# Patient Record
Sex: Female | Born: 1935
Health system: Southern US, Community
[De-identification: ages and names within clinical notes are randomized; demographics above are authoritative.]

## PROBLEM LIST (undated history)

## (undated) DIAGNOSIS — F329 Major depressive disorder, single episode, unspecified: Secondary | ICD-10-CM

## (undated) DIAGNOSIS — T4145XA Adverse effect of unspecified anesthetic, initial encounter: Secondary | ICD-10-CM

## (undated) DIAGNOSIS — E039 Hypothyroidism, unspecified: Secondary | ICD-10-CM

## (undated) DIAGNOSIS — I501 Left ventricular failure: Secondary | ICD-10-CM

## (undated) DIAGNOSIS — D649 Anemia, unspecified: Secondary | ICD-10-CM

## (undated) DIAGNOSIS — I447 Left bundle-branch block, unspecified: Secondary | ICD-10-CM

## (undated) DIAGNOSIS — I10 Essential (primary) hypertension: Secondary | ICD-10-CM

## (undated) DIAGNOSIS — F32A Depression, unspecified: Secondary | ICD-10-CM

## (undated) DIAGNOSIS — T8859XA Other complications of anesthesia, initial encounter: Secondary | ICD-10-CM

## (undated) DIAGNOSIS — T2610XA Burn of cornea and conjunctival sac, unspecified eye, initial encounter: Secondary | ICD-10-CM

## (undated) DIAGNOSIS — R519 Headache, unspecified: Secondary | ICD-10-CM

## (undated) DIAGNOSIS — R0602 Shortness of breath: Secondary | ICD-10-CM

## (undated) DIAGNOSIS — K219 Gastro-esophageal reflux disease without esophagitis: Secondary | ICD-10-CM

## (undated) DIAGNOSIS — F419 Anxiety disorder, unspecified: Secondary | ICD-10-CM

## (undated) DIAGNOSIS — R51 Headache: Secondary | ICD-10-CM

## (undated) DIAGNOSIS — I509 Heart failure, unspecified: Secondary | ICD-10-CM

## (undated) DIAGNOSIS — I639 Cerebral infarction, unspecified: Secondary | ICD-10-CM

## (undated) DIAGNOSIS — I219 Acute myocardial infarction, unspecified: Secondary | ICD-10-CM

## (undated) DIAGNOSIS — I428 Other cardiomyopathies: Secondary | ICD-10-CM

## (undated) DIAGNOSIS — M1990 Unspecified osteoarthritis, unspecified site: Secondary | ICD-10-CM

## (undated) HISTORY — PX: CARDIAC CATHETERIZATION: SHX172

## (undated) HISTORY — PX: EYE SURGERY: SHX253

## (undated) HISTORY — DX: Other cardiomyopathies: I42.8

## (undated) HISTORY — PX: TONSILLECTOMY: SUR1361

## (undated) HISTORY — PX: DILATION AND CURETTAGE OF UTERUS: SHX78

## (undated) HISTORY — PX: OTHER SURGICAL HISTORY: SHX169

## (undated) HISTORY — PX: CATARACT EXTRACTION, BILATERAL: SHX1313

## (undated) HISTORY — PX: HERNIA REPAIR: SHX51

---

## 1999-05-26 ENCOUNTER — Encounter: Admission: RE | Admit: 1999-05-26 | Discharge: 1999-05-26 | Payer: Self-pay | Admitting: Family Medicine

## 1999-05-26 ENCOUNTER — Encounter: Payer: Self-pay | Admitting: Family Medicine

## 2000-02-23 ENCOUNTER — Encounter: Payer: Self-pay | Admitting: Emergency Medicine

## 2000-02-23 ENCOUNTER — Emergency Department (HOSPITAL_COMMUNITY): Admission: EM | Admit: 2000-02-23 | Discharge: 2000-02-23 | Payer: Self-pay | Admitting: Emergency Medicine

## 2000-04-29 ENCOUNTER — Other Ambulatory Visit: Admission: RE | Admit: 2000-04-29 | Discharge: 2000-04-29 | Payer: Self-pay | Admitting: Internal Medicine

## 2000-05-31 ENCOUNTER — Ambulatory Visit (HOSPITAL_COMMUNITY): Admission: RE | Admit: 2000-05-31 | Discharge: 2000-05-31 | Payer: Self-pay | Admitting: Interventional Cardiology

## 2000-08-25 ENCOUNTER — Encounter: Admission: RE | Admit: 2000-08-25 | Discharge: 2000-08-25 | Payer: Self-pay | Admitting: Internal Medicine

## 2000-08-25 ENCOUNTER — Encounter: Payer: Self-pay | Admitting: Internal Medicine

## 2000-09-14 ENCOUNTER — Emergency Department (HOSPITAL_COMMUNITY): Admission: EM | Admit: 2000-09-14 | Discharge: 2000-09-14 | Payer: Self-pay

## 2001-03-25 ENCOUNTER — Encounter: Payer: Self-pay | Admitting: Emergency Medicine

## 2001-03-25 ENCOUNTER — Emergency Department (HOSPITAL_COMMUNITY): Admission: EM | Admit: 2001-03-25 | Discharge: 2001-03-25 | Payer: Self-pay | Admitting: Emergency Medicine

## 2001-12-14 ENCOUNTER — Encounter: Admission: RE | Admit: 2001-12-14 | Discharge: 2001-12-14 | Payer: Self-pay | Admitting: Internal Medicine

## 2001-12-14 ENCOUNTER — Encounter: Payer: Self-pay | Admitting: Internal Medicine

## 2002-05-02 ENCOUNTER — Other Ambulatory Visit: Admission: RE | Admit: 2002-05-02 | Discharge: 2002-05-02 | Payer: Self-pay | Admitting: Internal Medicine

## 2002-05-31 ENCOUNTER — Ambulatory Visit (HOSPITAL_COMMUNITY): Admission: RE | Admit: 2002-05-31 | Discharge: 2002-05-31 | Payer: Self-pay | Admitting: Gastroenterology

## 2004-08-25 ENCOUNTER — Encounter: Admission: RE | Admit: 2004-08-25 | Discharge: 2004-08-25 | Payer: Self-pay | Admitting: Internal Medicine

## 2005-09-20 ENCOUNTER — Ambulatory Visit: Payer: Self-pay | Admitting: Hematology & Oncology

## 2005-11-05 ENCOUNTER — Ambulatory Visit: Payer: Self-pay | Admitting: Hematology & Oncology

## 2005-12-16 ENCOUNTER — Encounter: Admission: RE | Admit: 2005-12-16 | Discharge: 2005-12-16 | Payer: Self-pay | Admitting: Internal Medicine

## 2006-02-01 ENCOUNTER — Ambulatory Visit: Payer: Self-pay | Admitting: Hematology & Oncology

## 2006-02-07 LAB — CBC WITH DIFFERENTIAL/PLATELET
Basophils Absolute: 0 10*3/uL (ref 0.0–0.1)
EOS%: 5.4 % (ref 0.0–7.0)
HCT: 37.4 % (ref 34.8–46.6)
HGB: 12.5 g/dL (ref 11.6–15.9)
MCH: 29.4 pg (ref 26.0–34.0)
MONO#: 0.3 10*3/uL (ref 0.1–0.9)
NEUT%: 61.9 % (ref 39.6–76.8)
Platelets: 237 10*3/uL (ref 145–400)
lymph#: 1.2 10*3/uL (ref 0.9–3.3)

## 2006-10-31 ENCOUNTER — Emergency Department (HOSPITAL_COMMUNITY): Admission: EM | Admit: 2006-10-31 | Discharge: 2006-10-31 | Payer: Self-pay | Admitting: Emergency Medicine

## 2006-12-01 ENCOUNTER — Emergency Department (HOSPITAL_COMMUNITY): Admission: EM | Admit: 2006-12-01 | Discharge: 2006-12-01 | Payer: Self-pay | Admitting: Emergency Medicine

## 2008-01-08 ENCOUNTER — Encounter: Admission: RE | Admit: 2008-01-08 | Discharge: 2008-01-08 | Payer: Self-pay | Admitting: Gastroenterology

## 2008-01-16 ENCOUNTER — Emergency Department (HOSPITAL_COMMUNITY): Admission: EM | Admit: 2008-01-16 | Discharge: 2008-01-17 | Payer: Self-pay | Admitting: Emergency Medicine

## 2008-01-23 ENCOUNTER — Encounter: Admission: RE | Admit: 2008-01-23 | Discharge: 2008-01-23 | Payer: Self-pay | Admitting: Gastroenterology

## 2008-02-07 ENCOUNTER — Encounter: Admission: RE | Admit: 2008-02-07 | Discharge: 2008-02-07 | Payer: Self-pay | Admitting: Gastroenterology

## 2008-03-04 ENCOUNTER — Ambulatory Visit (HOSPITAL_COMMUNITY): Admission: RE | Admit: 2008-03-04 | Discharge: 2008-03-04 | Payer: Self-pay | Admitting: Gastroenterology

## 2008-06-20 ENCOUNTER — Emergency Department (HOSPITAL_COMMUNITY): Admission: EM | Admit: 2008-06-20 | Discharge: 2008-06-20 | Payer: Self-pay | Admitting: Emergency Medicine

## 2009-03-24 ENCOUNTER — Inpatient Hospital Stay (HOSPITAL_COMMUNITY): Admission: RE | Admit: 2009-03-24 | Discharge: 2009-03-27 | Payer: Self-pay | Admitting: General Surgery

## 2009-03-24 ENCOUNTER — Encounter (INDEPENDENT_AMBULATORY_CARE_PROVIDER_SITE_OTHER): Payer: Self-pay | Admitting: General Surgery

## 2009-09-10 ENCOUNTER — Encounter: Admission: RE | Admit: 2009-09-10 | Discharge: 2009-09-10 | Payer: Self-pay | Admitting: Gastroenterology

## 2010-06-26 ENCOUNTER — Other Ambulatory Visit: Admission: RE | Admit: 2010-06-26 | Discharge: 2010-06-26 | Payer: Self-pay | Admitting: Gastroenterology

## 2010-07-16 ENCOUNTER — Encounter
Admission: RE | Admit: 2010-07-16 | Discharge: 2010-07-16 | Payer: Self-pay | Source: Home / Self Care | Attending: Neurology | Admitting: Neurology

## 2010-08-30 ENCOUNTER — Encounter: Payer: Self-pay | Admitting: Gastroenterology

## 2010-10-12 ENCOUNTER — Other Ambulatory Visit: Payer: Self-pay | Admitting: Gastroenterology

## 2010-11-14 LAB — CBC
HCT: 40.2 % (ref 36.0–46.0)
Hemoglobin: 13.3 g/dL (ref 12.0–15.0)
Platelets: 191 10*3/uL (ref 150–400)
RBC: 4.44 MIL/uL (ref 3.87–5.11)
RDW: 14.2 % (ref 11.5–15.5)
WBC: 8.1 10*3/uL (ref 4.0–10.5)

## 2010-11-14 LAB — COMPREHENSIVE METABOLIC PANEL
ALT: 11 U/L (ref 0–35)
Alkaline Phosphatase: 49 U/L (ref 39–117)
BUN: 14 mg/dL (ref 6–23)
CO2: 26 mEq/L (ref 19–32)
Chloride: 107 mEq/L (ref 96–112)
GFR calc non Af Amer: 57 mL/min — ABNORMAL LOW (ref >60)
Glucose, Bld: 81 mg/dL (ref 70–99)
Potassium: 4.7 mEq/L (ref 3.5–5.1)
Sodium: 143 mEq/L (ref 135–145)
Total Bilirubin: 0.7 mg/dL (ref 0.3–1.2)

## 2010-11-14 LAB — BASIC METABOLIC PANEL
BUN: 15 mg/dL (ref 6–23)
Calcium: 8.5 mg/dL (ref 8.4–10.5)
GFR calc non Af Amer: >60 mL/min (ref >60)
Glucose, Bld: 133 mg/dL — ABNORMAL HIGH (ref 70–99)
Sodium: 136 mEq/L (ref 135–145)

## 2010-11-14 LAB — DIFFERENTIAL
Basophils Absolute: 0 10*3/uL (ref 0.0–0.1)
Basophils Relative: 1 % (ref 0–1)
Eosinophils Absolute: 0.2 10*3/uL (ref 0.0–0.7)
Monocytes Relative: 6 % (ref 3–12)
Neutrophils Relative %: 69 % (ref 43–77)

## 2010-11-14 LAB — PROTIME-INR: INR: 0.9 (ref 0.00–1.49)

## 2010-11-14 LAB — ABO/RH: ABO/RH(D): O POS

## 2010-12-22 NOTE — Op Note (Signed)
NAME:  Leslie [name], Leslie [name] NO.:  [id number]   MEDICAL RECORD NO.:  [id number]          PATIENT TYPE:  AMB   LOCATION:  ENDO                         FACILITY:  [facility]   PHYSICIAN:  Danise Edge, M.D.   DATE OF BIRTH:  05/07/1931   DATE OF PROCEDURE:  03/04/2008  DATE OF DISCHARGE:  03/04/2008                               OPERATIVE REPORT   PROCEDURE INDICATIONS:  Ms. Leslie [name] is a 77 year old female born  on 09-04-1930.   Leslie [name] has a giant symptomatic hiatal hernia demonstrated by  esophagogastroduodenoscopy in 2008 and upper GI x-ray series on February 07, 2008.  She is scheduled to undergo hiatal hernia repair.  She is  undergoing a preoperative esophageal manometry.   CHRONIC MEDICATIONS:  Toprol, Evista, Accupril, Lexapro, Synthroid,  Aciphex, Plavix, vitamin B12 injections, oral calcium, vitamin D,  furosemide, glucosamine, and potassium.   Lower esophageal sphincter function.  The resting lower esophageal  sphincter pressure is normal at 41.2 mmHg.  The lower esophageal  sphincter length is 3.5 cm.  With swallows, the lower esophageal  sphincter relaxes 82% for 12.2 seconds; the residual pressure is 7.2  mmHg.  Lower esophageal sphincter function is normal.   Esophageal body function based on 10 wet swallows.  A 10 wet swallows  resulted in 90% peristaltic contractions and 10% retrograde  contractions.  Peristaltic contraction amplitude was normal at 83 mmHg.   Upper esophageal sphincter function based on six wet swallows.  There is  94% relaxation of the upper esophageal sphincter with swallows.   ASSESSMENT:  Normal esophageal manometry.   RECOMMENDATIONS:  Proceed with hiatal hernia repair surgery.           ______________________________  Danise Edge, M.D.     MJ/MEDQ  D:  03/12/2008  T:  03/13/2008  Job:  [id number]   cc:   Leslie [name], M.D.

## 2010-12-22 NOTE — Op Note (Signed)
NAME:  Leslie [name], Leslie [name]                ACCOUNT NO.:  [id number]   MEDICAL RECORD NO.:  [id number]          PATIENT TYPE:  AMB   LOCATION:  DAY                          FACILITY:  [facility]   PHYSICIAN:  Adolph Pollack, M.D.DATE OF BIRTH:  1930-12-12   DATE OF PROCEDURE:  03/24/2009  DATE OF DISCHARGE:                               OPERATIVE REPORT   PREOPERATIVE DIAGNOSIS:  Large hiatal hernia (symptomatic).   POSTOPERATIVE DIAGNOSIS:  Large hiatal hernia (symptomatic).   PROCEDURE:  1. Laparoscopic hiatal hernia repair.  2. Laparoscopic gastropexy.   ASSISTANT:  Anselm Pancoast. Zachery Dakins, M.D.   ANESTHESIA:  General.   INDICATIONS:  This 77 year old female has developed some chest and  epigastric type pains, and on further workup is found to have a  giant/large hiatal hernia, with over 50% of her stomach in her chest.  She has no reflux.  She has a normal manometry.  She has received  cardiac clearance and has elected to have the hernia repaired.  The  procedure, risks and aftercare were discussed with her preoperatively.   TECHNIQUE:  She is brought to the operating room, placed supine on the  operating table and general anesthetic was administered.  Foley catheter  was inserted and the abdominal wall was sterilely prepped and draped.  A  supraumbilical incision was made in the midline through the skin,  subcutaneous tissue, fascial layers and peritoneum -- entering the  peritoneal cavity under direct vision.  A pursestring suture of 0 Vicryl  was placed around the fascial edges.  A Hassan trocar was introduced  into the peritoneal cavity and pneumoperitoneum created by insufflation  of CO2 gas.   Next, the angled laparoscope was placed and she was put in reverse  Trendelenburg position.  Two 11 mm trocars were placed in the right  upper quadrant, and one in the left upper quadrant.  A 5 mm trocar was  placed in the subxiphoid region and removed, and a self-retaining liver  retractor was then inserted into the peritoneal cavity through the 5 mm  trocar site.  This retracted the left lobe of the level of liver  anteriorly, exposing the enlarged hiatus.   The stomach was grasped and then pulled back, and into the abdominal  cavity through the hiatal hernia.  Beginning at the left crus, I began  dissecting the sac free from the crus.  I then began at the right crus  and using the harmonic scalpel began incising the sac and dissecting it  free; and I carried this anteriorly, retracting the sac back into the  abdominal cavity, and with it the stomach came as well.  I noticed a  large left hepatic artery was present, and this was preserved.  I  continued to mobilize the hernia sac free from the crura on both sides.  I continued also to mobilize it from the anterior crura as well, and  that is when I reduced the sac back into the abdominal cavity.  The  stomach was completely reduced.  I then was able to create a  retroesophageal window.  The vagus  nerves were both identified and  preserved.  A Penrose drain was placed through the retroesophageal  window, and the GE junction retracted anteriorly.  Posterior sac  attachments to the crura were then divided with the harmonic scalpel.   I then debrided the sac from the stomach using the harmonic scalpel, and  removed a significant amount of the sac and sent it to pathology for  permanent evaluation.   I then inspected the stomach, esophagus and liver; no bleeding or injury  was noted.   Following this, I proceeded to repair the hiatus.  Using 0 nonabsorbable  pledgeted sutures, I repaired the hiatal hernia defect with minimal if  any tension, using 5 of these stitches.  Before beginning the repair, I  inserted a size 40 bougie so I could easily identify the esophagus and  engage the repair.  The dilator was then removed.  There appeared to be  about 2-3 cm of intra-abdominal esophagus present.  I had to mobilize   some of the esophagus from its lateral attachments up in the  mediastinum,  prior to the repair.   I elected not to perform a Nissen fundoplication, given the fact that  she had no reflux preoperatively.  However, I did elect to perform a  gastropexy.  I grabbed the body of the stomach, and brought it up to the  left upper quadrant trocar site using a retractor.  I then performed a  four suture gastropexy with 0 nonabsorbable sutures, including bites of  the abdominal wall musculature used to hold the stomach there.  This  provided an adequate gastropexy.  That trocar was removed.   I then inspected the area and no bleeding was noted.  No evidence of  esophageal, gastric, splenic, liver or intestinal injury were noted.   The self-retaining liver retractor was removed.  She was taken down out  of reverse Trendelenburg position.  All of the remaining trocars were  removed.   The supraumbilical fascial defect was closed by tightening up and tying  down the pursestring suture.  The skin incisions were closed with 4-0  Monocryl subcuticular stitches, followed by Steri-Strips and sterile  dressings.   She tolerated the procedure well without any apparent complications, and  was taken to recovery room in satisfactory condition.      Adolph Pollack, M.D.  Electronically Signed     TJR/MEDQ  D:  03/24/2009  T:  03/24/2009  Job:  [id number]   cc:   Danise Edge, M.D.  Fax: [phone]   Lyn Records, M.D.  Fax: [phone]

## 2010-12-22 NOTE — H&P (Signed)
NAME:  Leslie [name], Leslie [name]                ACCOUNT NO.:  [id number]   MEDICAL RECORD NO.:  [id number]          PATIENT TYPE:  AMB   LOCATION:  DAY                          FACILITY:  [facility]   PHYSICIAN:  Adolph Pollack, M.D.DATE OF BIRTH:  [birth year]   DATE OF ADMISSION:  03/24/2009  DATE OF DISCHARGE:                              HISTORY & PHYSICAL   REASON FOR ADMISSION:  Elective repair of large hiatal hernia.   HISTORY OF PRESENT ILLNESS:  Leslie [name] is a 77 year old female who  started having problems with sharp type pains in her epigastrium,  radiating bilaterally, leading her to go to the emergency department.  She had cardiac evaluation and ultrasound with gallstones, but what was  found is that she had a large hiatal hernia.  Manometry is normal.  She  does not have any reflux symptoms.  Because of for symptomatic hiatal  hernia, she has elected to undergo repair.  She was seen by Dr. Verdis Prime and cleared from a Cardiology standpoint to have the operation.   PAST MEDICAL HISTORY:  1. Large hiatal hernia.  2. Ischemic cardiomyopathy.  3. Cerebrovascular accident with TIAs.  4. Central hypertension.  5. Hypothyroidism.  6. Iron deficiency anemia.  7. Depression.  8. Carotid artery disease.  9. Retinal detachment.   PREVIOUS OPERATIONS:  Cataract surgery.   ALLERGIES:  SULFA DRUGS.   MEDICATIONS:  Metoprolol, Plavix, furosemide, quinapril, Lexapro, L-  thyroxine, iron, lorazepam, oxycodone p.r.n., calcium, glucosamine.   SOCIAL HISTORY:  Has an aide helper at home.  No tobacco or alcohol use.   FAMILY HISTORY:  Notable for heart disease, hypertension, dementia and  cancer.  She states her mother had a hiatal hernia.   PHYSICAL EXAMINATION:  GENERAL:  An elderly female in no acute distress.  She is pleasant and cooperative.  VITAL SIGNS: Temperature is 97.2, blood pressure is 157/78, pulse 61,  respiratory rate 18, O2 saturations 95% on room air.  NECK:  Supple  without masses.  RESPIRATORY:  Breath sounds equal and clear.  Respirations unlabored.  CARDIOVASCULAR:  Regular rate, regular rhythm.  No significant lower  extremity edema.  ABDOMEN:  Soft, nontender, nondistended.  No masses or organomegaly.  MUSCULOSKELETAL:  Good range of motion.  SKIN:  No jaundice.   IMPRESSION:  Symptomatic large hiatal hernia.   PLAN:  Laparoscopic possible open repair of large hiatal hernia.  Possible fundoplication as well, although, she does not have reflux  symptoms Upon further consideration, it may  be best for her to have the  hiatal hernia and gastrostomy tube or gastropexy done.      Adolph Pollack, M.D.  Electronically Signed     TJR/MEDQ  D:  03/24/2009  T:  03/24/2009  Job:  [id number]   cc:   Danise Edge, M.D.  Fax: [phone]   Lyn Records, M.D.  Fax: [phone]

## 2010-12-25 NOTE — Cardiovascular Report (Signed)
[place]  Patient:    Leslie [name], Leslie [name]                       MRN: [id number] Adm. Date:  [id number] Attending:  Lyn Records. Iii CC:         Georgann Housekeeper, M.D.   Cardiac Catheterization  ADDENDUM:  Ms. Hadlock underwent left and right heart catheterization.  The arteriotomy puncture was closed by Perclose without complications.  She received 1 gram of Ancef as prophylaxis against puncture site infection. DD:  05/31/00 TD:  05/31/00 Job: [id number]

## 2010-12-25 NOTE — Op Note (Signed)
   NAME:  Leslie [name], Leslie [name]                          ACCOUNT NO.:  [id number]   MEDICAL RECORD NO.:  [id number]                   PATIENT TYPE:  AMB   LOCATION:  ENDO                                 FACILITY:  [facility]   PHYSICIAN:  Danise Edge, M.D.                DATE OF BIRTH:  10/09/1930   DATE OF PROCEDURE:  05/31/2002  DATE OF DISCHARGE:                                 OPERATIVE REPORT   PROCEDURE:  Screening colonoscopy.   INDICATIONS:  The patient is a 77 year old female born [birth year].  The patient  is scheduled to undergo her first screening colonoscopy with polypectomy to  prevent colon cancer.  I discussed with the patient the complications  associated with colonoscopy and polypectomy, including a 15 per thousand  risk of bleeding and one per thousand risk of colon perforation requiring  surgical repair.  The patient has signed the operative permit.   ENDOSCOPIST:  Danise Edge, M.D.   PREMEDICATION:  Versed 10 mg, fentanyl 50 mcg.   ENDOSCOPE:  Olympus pediatric colonoscope.   DESCRIPTION OF PROCEDURE:  After obtaining informed consent, the patient was  placed in the left lateral decubitus position.  I administered intravenous  Versed and intravenous fentanyl to achieve conscious sedation for the  procedure.  The patient's blood pressure, oxygen saturation, and cardiac  rhythm were monitored throughout the procedure and documented in the medical  record.   Anal inspection was normal.  Digital rectal exam was normal.  The Olympus  pediatric video colonoscope was introduced into the rectum and easily  advanced to the cecum.  Colonic preparation for the exam today was  excellent.   Rectum normal.   Sigmoid colon and descending colon normal.   Splenic flexure normal.   Transverse colon normal.   Hepatic flexure normal.   Ascending colon normal.   Cecum and ileocecal valve normal.    ASSESSMENT:  Normal screening proctocolonoscopy to the cecum.  No  endoscopic  evidence for the presence of colorectal neoplasia.                                                Danise Edge, M.D.    MJ/MEDQ  D:  05/31/2002  T:  05/31/2002  Job:  [id number]   cc:   Georgann Housekeeper, M.D.  [address]  [place]  [place] [address]  Fax: [phone]

## 2010-12-25 NOTE — Cardiovascular Report (Signed)
[place]  Patient:    LIVIAH, CAKE                       MRN: [id number] Proc. Date: 05/31/00 Adm. Date:  [id number] Attending:  Lyn Records. Iii CC:         Georgann Housekeeper, M.D.   Cardiac Catheterization  INDICATIONS:  Abnormal CV functional test with LV dysfunction and abnormal perfusion pattern suggesting prior infarction.  PROCEDURE: 1. Left heart catheterization. 2. Right heart catheterization. 3. Coronary angiography. 4. Left ventriculography.  RESULTS:  HEMODYNAMIC DATA:  Aortic pressure 171/70 mmHg.  Left ventricular pressure 173/21 mmHg.  Right atrial mean pressure 7.  RV pressure 38/10 mmHg. Pulmonary artery pressure 37/21 mmHg with a mean of 28.  Pulmonary capillary wedge pressure mean 17 mmHg.  Thermodilution cardiac output 4.5 l/min.  Fick cardiac output 5.0 l/min.  LEFT VENTRICULOGRAPHY:  The left ventricle was dilated.  There appears to be more significant abnormality in wall motion involving the anterior and apical region than the anterobasal and inferobasal region.  The EF is estimated to be 35 to 45%.  No mitral regurgitation is noted.  CORONARY ANGIOGRAPHY:  Left main coronary artery.  Normal.  Left anterior descending coronary artery.  Normal.  No evidence of significant obstruction.  A large diagonal branch arises from the proximal LAD.  Two smaller diagonal branches arise from the midvessel.  The entire LAD system is normal.  Circumflex artery.  The circumflex is relatively small giving origin to three obtuse marginal branches.  The system is normal.  Right coronary artery.  The right coronary artery is a large vessel that gives origin to a large posterior descending branch and three left ventricular branches and is normal.  CONCLUSION: 1. Normal coronary arteries. 2. Left ventricular dysfunction with ejection fraction in the 35 to 45% range. 3. Normal right heart pressures.  RECOMMENDATION:  Medical  therapy to include ACE inhibitor therapy, beta blocker therapy, and potential diuretic therapy if needed. DD:  05/31/00 TD:  05/31/00 Job: [id number]

## 2011-05-06 LAB — CBC
HCT: 36.4
MCV: 77.4 — ABNORMAL LOW
RBC: 4.71
WBC: 9.5

## 2011-05-06 LAB — DIFFERENTIAL
Lymphocytes Relative: 12
Lymphs Abs: 1.2
Monocytes Relative: 7
Neutro Abs: 7.2
Neutrophils Relative %: 75

## 2011-05-06 LAB — COMPREHENSIVE METABOLIC PANEL
BUN: 15
Calcium: 9.1
Creatinine, Ser: 1.27 — ABNORMAL HIGH
Glucose, Bld: 135 — ABNORMAL HIGH
Sodium: 137
Total Protein: 6.7

## 2011-05-06 LAB — POCT CARDIAC MARKERS
CKMB, poc: 1.8
CKMB, poc: <1 — ABNORMAL LOW
Myoglobin, poc: 81.8
Troponin i, poc: <0.05
Troponin i, poc: <0.05

## 2011-05-06 LAB — URINALYSIS, ROUTINE W REFLEX MICROSCOPIC
Ketones, ur: NEGATIVE
Nitrite: NEGATIVE
Specific Gravity, Urine: 1.025
Urobilinogen, UA: 0.2
pH: 5

## 2011-05-06 LAB — LIPASE, BLOOD: Lipase: 35

## 2011-05-06 LAB — URINE MICROSCOPIC-ADD ON

## 2011-05-11 LAB — D-DIMER, QUANTITATIVE: D-Dimer, Quant: 7.12 — ABNORMAL HIGH

## 2011-05-11 LAB — LIPASE, BLOOD: Lipase: 29

## 2011-05-11 LAB — COMPREHENSIVE METABOLIC PANEL
ALT: 16
BUN: 24 — ABNORMAL HIGH
CO2: 31
Calcium: 10.2
Creatinine, Ser: 1.23 — ABNORMAL HIGH
GFR calc non Af Amer: 43 — ABNORMAL LOW
Glucose, Bld: 129 — ABNORMAL HIGH
Sodium: 143
Total Protein: 7.2

## 2011-05-11 LAB — CBC
Hemoglobin: 15.4 — ABNORMAL HIGH
MCHC: 32.9
MCV: 88.1
RDW: 14.1

## 2011-05-11 LAB — POCT CARDIAC MARKERS
CKMB, poc: 1
Myoglobin, poc: 91.1
Troponin i, poc: <0.05

## 2011-05-11 LAB — DIFFERENTIAL
Lymphocytes Relative: 13
Lymphs Abs: 1.4
Monocytes Relative: 5
Neutro Abs: 8.9 — ABNORMAL HIGH
Neutrophils Relative %: 81 — ABNORMAL HIGH

## 2011-09-30 ENCOUNTER — Encounter (HOSPITAL_COMMUNITY): Payer: Self-pay | Admitting: Emergency Medicine

## 2011-09-30 ENCOUNTER — Emergency Department (HOSPITAL_COMMUNITY): Payer: Medicare Other

## 2011-09-30 ENCOUNTER — Other Ambulatory Visit: Payer: Self-pay

## 2011-09-30 ENCOUNTER — Emergency Department (HOSPITAL_COMMUNITY)
Admission: EM | Admit: 2011-09-30 | Discharge: 2011-09-30 | Disposition: A | Discharge status: Home / Self Care | Payer: Medicare Other | Attending: Emergency Medicine | Admitting: Emergency Medicine

## 2011-09-30 DIAGNOSIS — I6789 Other cerebrovascular disease: Secondary | ICD-10-CM | POA: Insufficient documentation

## 2011-09-30 DIAGNOSIS — I252 Old myocardial infarction: Secondary | ICD-10-CM | POA: Insufficient documentation

## 2011-09-30 DIAGNOSIS — M542 Cervicalgia: Secondary | ICD-10-CM

## 2011-09-30 DIAGNOSIS — R51 Headache: Secondary | ICD-10-CM

## 2011-09-30 DIAGNOSIS — I69998 Other sequelae following unspecified cerebrovascular disease: Secondary | ICD-10-CM | POA: Insufficient documentation

## 2011-09-30 DIAGNOSIS — H538 Other visual disturbances: Secondary | ICD-10-CM | POA: Insufficient documentation

## 2011-09-30 DIAGNOSIS — I10 Essential (primary) hypertension: Secondary | ICD-10-CM | POA: Insufficient documentation

## 2011-09-30 DIAGNOSIS — M47812 Spondylosis without myelopathy or radiculopathy, cervical region: Secondary | ICD-10-CM

## 2011-09-30 HISTORY — DX: Cerebral infarction, unspecified: I63.9

## 2011-09-30 HISTORY — DX: Essential (primary) hypertension: I10

## 2011-09-30 HISTORY — DX: Left ventricular failure, unspecified: I50.1

## 2011-09-30 HISTORY — DX: Left bundle-branch block, unspecified: I44.7

## 2011-09-30 HISTORY — DX: Acute myocardial infarction, unspecified: I21.9

## 2011-09-30 LAB — COMPREHENSIVE METABOLIC PANEL
ALT: 10 U/L (ref 0–35)
AST: 17 U/L (ref 0–37)
Albumin: 3.7 g/dL (ref 3.5–5.2)
CO2: 33 mEq/L — ABNORMAL HIGH (ref 19–32)
Calcium: 9.7 mg/dL (ref 8.4–10.5)
Chloride: 104 mEq/L (ref 96–112)
Creatinine, Ser: 0.86 mg/dL (ref 0.50–1.10)
Sodium: 142 mEq/L (ref 135–145)

## 2011-09-30 LAB — DIFFERENTIAL
Basophils Absolute: 0 10*3/uL (ref 0.0–0.1)
Basophils Relative: 1 % (ref 0–1)
Lymphocytes Relative: 24 % (ref 12–46)
Monocytes Absolute: 0.4 10*3/uL (ref 0.1–1.0)
Neutro Abs: 4.1 10*3/uL (ref 1.7–7.7)

## 2011-09-30 LAB — PROTIME-INR: INR: 0.92 (ref 0.00–1.49)

## 2011-09-30 LAB — CBC
HCT: 43.2 % (ref 36.0–46.0)
MCHC: 33.3 g/dL (ref 30.0–36.0)
Platelets: 210 10*3/uL (ref 150–400)
RDW: 13 % (ref 11.5–15.5)
WBC: 6.2 10*3/uL (ref 4.0–10.5)

## 2011-09-30 LAB — APTT: aPTT: 31 seconds (ref 24–37)

## 2011-09-30 MED ORDER — ONDANSETRON HCL 4 MG/2ML IJ SOLN
4.0000 mg | Freq: Once | INTRAMUSCULAR | Status: AC
  Administered 2011-09-30: 4 mg via INTRAVENOUS

## 2011-09-30 MED ORDER — ONDANSETRON HCL 4 MG/2ML IJ SOLN
INTRAMUSCULAR | Status: AC
  Filled 2011-09-30: qty 2

## 2011-09-30 MED ORDER — DOXYCYCLINE HYCLATE 100 MG PO TABS
ORAL_TABLET | ORAL | Status: AC
  Filled 2011-09-30: qty 1

## 2011-09-30 MED ORDER — HYDROMORPHONE HCL PF 1 MG/ML IJ SOLN
0.5000 mg | Freq: Once | INTRAMUSCULAR | Status: AC
  Administered 2011-09-30: 0.5 mg via INTRAVENOUS
  Filled 2011-09-30: qty 1

## 2011-09-30 MED ORDER — TRAMADOL HCL 50 MG PO TABS: 50.0000 mg | ORAL_TABLET | Freq: Four times a day (QID) | ORAL | Status: AC | PRN

## 2011-09-30 MED ORDER — SODIUM CHLORIDE 0.9 % IV BOLUS (SEPSIS)
500.0000 mL | Freq: Once | INTRAVENOUS | Status: AC
  Administered 2011-09-30: 500 mL via INTRAVENOUS

## 2011-09-30 NOTE — ED Provider Notes (Signed)
Patient with headache began on Sunday and continued through Tuesday. She states that it resolved yesterday and then returned again today. Patient describes the headache as diffuse and behind her eyes. She does have a history of migraines but has not had them for approximately 20 years. She's had some nausea that this but no vomiting. She has had a sixth nerve palsy of her left eye secondary to a stroke and has had blurring of her vision secondary to this has not had any new changes in her vision. She has not had any fever, chills, nasal congestion, or history of bleeding disorders.  Physical exam well-developed well-nourished female who appears to be mildly uncomfortable Vital signs are significant for heart grade of 55 and blood pressure 151/81 HEENT are significant significant for a left sixth nerve palsy but pupils are equal round and reactive to light. There is no facial droop noted. Mucous membranes are moist. Neck trachea is midline no thyromegaly is noted.  Carotid pulses are 2+ bilaterally Chest is symmetrical lungs are clear to auscultation heart is regular rate and rhythm Abdomen is soft and nontender Extremities no she does show signs of trauma or edema Neurologically she is alert and oriented x3. She has no palmar drift. Her strength is equal throughout. Deep tendon reflexes are equal throughout.    Hilario Quarry, MD 09/30/11 [phone]

## 2011-09-30 NOTE — ED Provider Notes (Signed)
History     CSN: [id number]  Arrival date & time 09/30/11  1619   First MD Initiated Contact with Patient 09/30/11 1634      Chief Complaint  Patient presents with  . Headache  . Hypertension    (Consider location/radiation/quality/duration/timing/severity/associated sxs/prior treatment) HPI Leslie [name] is a 78 y.o. female who has had intermittent headache for 5 days. She had no pain. Yesterday. Today, the pain came back. She has been sewing a lot lately and feels like pulling on heavy material has caused some shoulder pain. She has chronic left cranial nerve 6 palsy and feels like it is getting somewhat worse gradually over the last several months. She has no other weakness. She has been seeing ophthalmologist for bilateral blurred vision and has received some laser treatment. She is taking her Plavix as prescribed for CVA. She is due to get carotid study to followup on narrowing. She has no chest pain, nausea, vomiting, cough, shortness of breath, or intestinal disorders. She does not describe a thunderclap headache. She has not had persistent headache like this in the past. She has generalized arthritis, but never has had her neck imaged.     Past Medical History  Diagnosis Date  . Bundle branch block left   . Hypertension   . MI (myocardial infarction)   . Heart failure, left-sided   . Stroke     blurred vision residual    No past surgical history on file.  No family history on file.  History  Substance Use Topics  . Smoking status: Not on file  . Smokeless tobacco: Not on file  . Alcohol Use:     OB History    Grav Para Term Preterm Abortions TAB SAB Ect Mult Living                  Review of Systems  All other systems reviewed and are negative.    Allergies  Sulfa antibiotics  Home Medications   Current Outpatient Rx  Name Route Sig Dispense Refill  . CALCIUM 1200 PO Oral Take 1 tablet by mouth daily.    Marland Kitchen CLOPIDOGREL BISULFATE 75 MG PO TABS Oral  Take 75 mg by mouth daily.    Marland Kitchen ESCITALOPRAM OXALATE 10 MG PO TABS Oral Take 10 mg by mouth daily.    . FUROSEMIDE 20 MG PO TABS Oral Take 20 mg by mouth every morning.    Marland Kitchen LEVOTHYROXINE SODIUM 100 MCG PO TABS Oral Take 100 mcg by mouth every morning.    Marland Kitchen LORAZEPAM 1 MG PO TABS Oral Take 1 mg by mouth at bedtime as needed. For anxiety/restlessness    . METOPROLOL SUCCINATE ER 50 MG PO TB24 Oral Take 50 mg by mouth every morning. Take with or immediately following a meal.    . QUINAPRIL HCL 40 MG PO TABS Oral Take 40 mg by mouth at bedtime.    Marland Kitchen RABEPRAZOLE SODIUM 20 MG PO TBEC Oral Take 20 mg by mouth daily as needed. For indigestion    . TRAMADOL HCL 50 MG PO TABS Oral Take 1 tablet (50 mg total) by mouth every 6 (six) hours as needed for pain. 30 tablet 0    BP 139/61  Pulse 52  Temp(Src) 98.2 F (36.8 C) (Oral)  Resp 15  SpO2 96%  Physical Exam  Nursing note and vitals reviewed. Constitutional: She is oriented to person, place, and time. She appears well-developed and well-nourished.  HENT:  Head: Normocephalic and atraumatic.  Eyes: Conjunctivae and EOM are normal. Pupils are equal, round, and reactive to light.  Neck: Normal range of motion and phonation normal. Neck supple.       There is no meningismus. She has somewhat decreased range of motion to lateral looking, which seems to be limited by pain.  Cardiovascular: Normal rate, regular rhythm and intact distal pulses.   Pulmonary/Chest: Effort normal and breath sounds normal. She exhibits no tenderness.  Abdominal: Soft. She exhibits no distension. There is no tenderness. There is no guarding.  Musculoskeletal: Normal range of motion.  Neurological: She is alert and oriented to person, place, and time. She has normal strength. She exhibits normal muscle tone. Coordination normal.       She has a very mild, left cranial nerve 6 palsy, but otherwise no apparent cranial nerve abnormality. She has normal strength and sensation in  the arms and legs bilaterally.  Skin: Skin is warm and dry.  Psychiatric: Her behavior is normal. Judgment and thought content normal.       She appears depressed    ED Course  Procedures (including critical care time)  Labs Reviewed  COMPREHENSIVE METABOLIC PANEL - Abnormal; Notable for the following:    CO2 33 (*)    Glucose, Bld 101 (*)    GFR calc non Af Amer 63 (*)    GFR calc Af Amer 73 (*)    All other components within normal limits  CBC  DIFFERENTIAL  PROTIME-INR  APTT   Dg Cervical Spine Complete  09/30/2011  *RADIOLOGY REPORT*  Clinical Data: Neck pain and headaches.  CERVICAL SPINE - COMPLETE 4+ VIEW  Comparison: None.  Findings: There is no evidence of fracture or subluxation. Vertebral bodies demonstrate normal height and alignment.  There is suggestion of mild disc space narrowing at C6-C7.  Prevertebral soft tissues are within normal limits.  The provided odontoid view demonstrates no significant abnormality.  The visualized lung apices are clear.  IMPRESSION:  1.  No evidence of fracture or subluxation along the cervical spine. 2.  Minimal degenerative change at the lower cervical spine.  Original Report Authenticated By: Tonia Ghent, M.D.   Ct Head Wo Contrast  09/30/2011  *RADIOLOGY REPORT*  Clinical Data: Headache, hypertension.  CT HEAD WITHOUT CONTRAST  Technique:  Contiguous axial images were obtained from the base of the skull through the vertex without contrast.  Comparison: 10/31/2006  Findings: Chronic small vessel disease changes throughout the deep white matter. No acute intracranial abnormality.  Specifically, no hemorrhage, hydrocephalus, mass lesion, acute infarction, or significant intracranial injury.  No acute calvarial abnormality. Visualized paranasal sinuses and mastoids clear.  Orbital soft tissues unremarkable.  IMPRESSION: Chronic small vessel disease. No acute intracranial abnormality.  Original Report Authenticated By: Cyndie Chime, M.D.    Patient received IV analgesia, and had complete resolution of her headache as of 2220 hours. She desired to get an x-ray of her neck to be evaluated for evident degenerative change.  1. Neck pain   2. Headache   3. Degenerative joint disease of cervical spine       MDM  Nonspecific headache. This intermittent headache is unlikely to be intracranial bleeding, stroke, meningitis, or related to a metabolic process. Patient appears depressed. This could contribute to her discomfort. She also has mild arthritis lower cervical spine, and has been doing activities, which can potentiate, neck pain and headache. Patient stable for discharge with symptomatic treatment.        Flint Melter, MD  09/30/11 2324 

## 2011-09-30 NOTE — ED Notes (Signed)
Per EMS; about Sunday, started to have headache, has gotten worse of last few days, no neuro deficits; pt with hx of CVA residual with blurred vision; previous hx of migraines- pt reports that this is worse; silent MI 2002; LBBB, HR 55; allergic to sulfa; last 188/100, 230/110; 20g LAC

## 2011-09-30 NOTE — ED Notes (Signed)
Patient transported to X-ray 

## 2011-09-30 NOTE — ED Notes (Signed)
Spoke with Dr. Lawana Pai. He states that he never received call for consult and after reviewing the information he does not feel that he needs to see this patient at this time. He states to have MD call him back if he needs to consult on the pt. Seems to be some confusion.

## 2011-09-30 NOTE — Discharge Instructions (Signed)
Use heat on the sore area of your neck 3-4 times a day. See Dr. for a checkup in one week. Return here if your symptoms worsen.Headache and Arthritis Headaches and arthritis are common problems. This causes an interest in the possible role of arthritis in causing headaches. Several major forms of arthritis exist. Two of the most common types are:  Rheumatoid arthritis.   Osteoarthritis.  Rheumatoid arthritis may begin at any age. It is a condition in which the body attacks some of its own tissues, thinking they do not belong. This leads to destruction of the bony areas around the joints. This condition may afflict any of the body's joints. It usually produces a deformity of the joint. The hands and fingers no longer appear straight but often appear angled towards one side. In some cases, the spine may be involved. Most often it is the vertebrae of the neck (cervicalspine). The areas of the neck most commonly afflicted by rheumatoid arthritis are the first and second cervical vertebrae. Curiously, rheumatoid arthritis, though it often produces severe deformities, is not always painful.  The more common form of arthritis is osteoarthritis. It is a wear-and-tear form of arthritis. It usually does not produce deformity of the joints or destruction of the bony tissues. Rather the ligaments weaken. They may be calcified due to the body's attempt to heal the damage. The larger joints of the body and those joints that take the most stress and strain are the most often affected. In the neck region this osteoarthritis usually involves the fifth, sixth and seventh vertebrae. This is because the effects of posture produce the most fatigue on them. Osteoarthritis is often more painful than rheumatoid arthritis.  During workups for arthritis, a test evaluating inflammation, (the sedimentation rate) often is performed. In rheumatoid arthritis, this test will usually be elevated. Other tests for inflammation may also be  elevated. In patients with osteoarthritis, x-rays of the neck or jaw joints will show changes from "lipping" of the vertebrae. This is caused by calcium deposits in the ligaments. Or they may show narrowing of the space between the vertebrae, or spur formation (from calcium deposits). If severe, it may cause obstruction of the holes where the nerves pass from the spine to the body. In rheumatoid arthritis, dislocation of vertebrae may occur in the upper neck. CT scan and MRI in patients with osteoarthritis may show bulging of the discs that cushion the vertebrae. In the most severe cases, herniation of the discs may occur.  Headaches, felt as a pain in the neck, may be caused by arthritis if the first, second or third vertebrae are involved. This condition is due to the nerves that supply the scalp only originating from this area of the spine. Neck pain itself, whether alone or coupled with headaches, can involve any portion of the neck. If the jaw is involved, the symptoms are similar to those of Temporomandibular Joint Syndrome (TMJ).  The progressive severity of rheumatoid arthritis may be slowed by a variety of potent medications. In osteoarthritis, its progression is not usually hindered by medication. The following may be helpful in slowing the advancement of the disorder:  Lifestyle adjustment.   Exercise.   Rest.   Weight loss.  Medications, such as the nonsteroidal anti-inflammatory agents (NSAIDs), are useful. They may reduce the pain and improve the reduced motion which occurs in joints afflicted by arthritis. From some studies, the use of acetaminophen appears to be as effective in controlling the pain of arthritis as  the NSAIDs. Physical modalities may also be useful for arthritis. They include:  Heat.   Massage.   Exercise.  But physical therapy must be prescribed by a caregiver, just as most medications for arthritis.  Document Released: 10/16/2003 Document Revised: 04/07/2011  Document Reviewed: 03/14/2008 [facility] Patient Information 2012 [place], [place].

## 2012-05-02 ENCOUNTER — Other Ambulatory Visit: Payer: Self-pay | Admitting: Neurosurgery

## 2012-05-03 ENCOUNTER — Encounter (HOSPITAL_COMMUNITY): Payer: Self-pay | Admitting: Pharmacy Technician

## 2012-05-09 NOTE — Pre-Procedure Instructions (Signed)
20 Leslie [name]  05/09/2012   Your procedure is scheduled on:  05-17-2012  Report to [facility] at 10:30 AM.  Call this number if you have problems the morning of surgery: [phone]   Remember:   Do not eat food or drink:After Midnight.     Take these medicines the morning of surgery with A SIP OF WATER: lexapro,levothyroxine,metoprolol(Toprol  XR) aciphex    Do not wear jewelry, make-up or nail polish.  Do not wear lotions, powders, or perfumes. You may wear deodorant.  Do not shave 48 hours prior to surgery. Men may shave face and neck.  Do not bring valuables to the hospital.  Contacts, dentures or bridgework may not be worn into surgery.  Leave suitcase in the car. After surgery it may be brought to your room.   For patients admitted to the hospital, checkout time is 11:00 AM the day of discharge.       Special Instructions: Shower using CHG 2 nights before surgery and the night before surgery.  If you shower the day of surgery use CHG.  Use special wash - you have one bottle of CHG for all showers.  You should use approximately 1/3 of the bottle for each shower.     Please read over the following fact sheets that you were given: Pain Booklet, MRSA Information and Surgical Site Infection Prevention

## 2012-05-10 ENCOUNTER — Ambulatory Visit (HOSPITAL_COMMUNITY)
Admission: RE | Admit: 2012-05-10 | Discharge: 2012-05-10 | Disposition: A | Discharge status: Home / Self Care | Payer: Medicare Other | Source: Ambulatory Visit | Attending: Neurosurgery | Admitting: Neurosurgery

## 2012-05-10 ENCOUNTER — Encounter (HOSPITAL_COMMUNITY)
Admission: RE | Admit: 2012-05-10 | Discharge: 2012-05-10 | Disposition: A | Discharge status: Home / Self Care | Payer: Medicare Other | Source: Ambulatory Visit | Attending: Neurosurgery | Admitting: Neurosurgery

## 2012-05-10 ENCOUNTER — Encounter (HOSPITAL_COMMUNITY): Payer: Self-pay

## 2012-05-10 DIAGNOSIS — Z01818 Encounter for other preprocedural examination: Secondary | ICD-10-CM | POA: Insufficient documentation

## 2012-05-10 DIAGNOSIS — Z01812 Encounter for preprocedural laboratory examination: Secondary | ICD-10-CM | POA: Insufficient documentation

## 2012-05-10 HISTORY — DX: Unspecified osteoarthritis, unspecified site: M19.90

## 2012-05-10 HISTORY — DX: Heart failure, unspecified: I50.9

## 2012-05-10 HISTORY — DX: Major depressive disorder, single episode, unspecified: F32.9

## 2012-05-10 HISTORY — DX: Gastro-esophageal reflux disease without esophagitis: K21.9

## 2012-05-10 HISTORY — DX: Depression, unspecified: F32.A

## 2012-05-10 HISTORY — DX: Anemia, unspecified: D64.9

## 2012-05-10 HISTORY — DX: Shortness of breath: R06.02

## 2012-05-10 LAB — BASIC METABOLIC PANEL
Calcium: 9.5 mg/dL (ref 8.4–10.5)
Chloride: 105 mEq/L (ref 96–112)
Creatinine, Ser: 0.82 mg/dL (ref 0.50–1.10)
GFR calc Af Amer: 77 mL/min — ABNORMAL LOW (ref >90)
GFR calc non Af Amer: 66 mL/min — ABNORMAL LOW (ref >90)

## 2012-05-10 LAB — SURGICAL PCR SCREEN
MRSA, PCR: NEGATIVE
Staphylococcus aureus: NEGATIVE

## 2012-05-10 LAB — CBC
Platelets: 216 10*3/uL (ref 150–400)
RDW: 13.8 % (ref 11.5–15.5)
WBC: 6.1 10*3/uL (ref 4.0–10.5)

## 2012-05-10 NOTE — Progress Notes (Signed)
Requested last office visit and any cardiac testing from [facility],Dr. Verdis Prime.

## 2012-05-11 ENCOUNTER — Encounter (HOSPITAL_COMMUNITY): Payer: Self-pay | Admitting: Vascular Surgery

## 2012-05-11 NOTE — Consult Note (Signed)
Anesthesia chart review: Patient is a 78 year old female scheduled for L4 laminectomy with bilateral L3 laminotomies and removal of synovial cyst by Dr. Lovell Sheehan on 05/17/2012.  History includes nonsmoker, hypertension, depression, chronic combined CHF, "myocardial infarction" (not supported by her 03/29/12 cardiology note), non-ischemic CM '01 with EF 30% now EF 45-50% 03/2012, chronic left BBB (present since at least '01), CVA '04, anemia, chronic DOE, s/p large hiatal hernia repair '10. Vitals and weight were not documented from her PAT visit.  PCP is Danise Edge.  Cardiologist is Dr. Katrinka Blazing [facility]).  He last saw her on 03/29/12 and one year follow-up was recommended.  Her EKG then showed SB @ 56 bpm, LAD, left BBB.  Echo on 04/05/12 showed borderline asymmetric LVH, LVEF estimated at 45-50%, abnormal septal wall motion be to bundle branch block, Doppler findings suggestive of grade Ia diastolic dysfunction with elevated LA pressure, aortic valve sclerosis but opens well, mild aortic valve regurgitation, moderate mitral annular calcification, mild mitral valve regurgitation, mild tricuspid regurgitation.  (By notes, previous EF was 50% in 2008.)  Cardiac cath on 05/31/00 showed normal coronary arteries, left ventricular dysfunction with ejection fraction 35-45%, normal right heart pressures.  Chest x-ray on 05/10/2012 showed no acute cardiopulmonary disease.  Labs noted.  She has a chronic left BBB, she saw her Cardiologist within the past two months, and overall EF was stable by echo.  Anticipate she can proceed as planned.  Shonna Chock, PA-C

## 2012-05-16 MED ORDER — CEFAZOLIN SODIUM-DEXTROSE 2-3 GM-% IV SOLR
2.0000 g | INTRAVENOUS | Status: DC
  Filled 2012-05-16: qty 50

## 2012-05-17 ENCOUNTER — Ambulatory Visit (HOSPITAL_COMMUNITY): Payer: Medicare Other | Admitting: Vascular Surgery

## 2012-05-17 ENCOUNTER — Encounter (HOSPITAL_COMMUNITY): Payer: Self-pay | Admitting: Vascular Surgery

## 2012-05-17 ENCOUNTER — Encounter (HOSPITAL_COMMUNITY)
Admission: RE | Disposition: A | Discharge status: Home / Self Care | Payer: Self-pay | Source: Ambulatory Visit | Attending: Neurosurgery

## 2012-05-17 ENCOUNTER — Ambulatory Visit (HOSPITAL_COMMUNITY): Payer: Medicare Other

## 2012-05-17 ENCOUNTER — Encounter (HOSPITAL_COMMUNITY): Payer: Self-pay | Admitting: *Deleted

## 2012-05-17 ENCOUNTER — Inpatient Hospital Stay (HOSPITAL_COMMUNITY)
Admission: RE | Admit: 2012-05-17 | Discharge: 2012-05-18 | DRG: 490 | Disposition: A | Discharge status: Home / Self Care | Payer: Medicare Other | Source: Ambulatory Visit | Attending: Neurosurgery | Admitting: Neurosurgery

## 2012-05-17 DIAGNOSIS — M48062 Spinal stenosis, lumbar region with neurogenic claudication: Principal | ICD-10-CM | POA: Diagnosis present

## 2012-05-17 DIAGNOSIS — I252 Old myocardial infarction: Secondary | ICD-10-CM

## 2012-05-17 DIAGNOSIS — Z79899 Other long term (current) drug therapy: Secondary | ICD-10-CM

## 2012-05-17 DIAGNOSIS — K219 Gastro-esophageal reflux disease without esophagitis: Secondary | ICD-10-CM | POA: Diagnosis present

## 2012-05-17 DIAGNOSIS — M7130 Other bursal cyst, unspecified site: Secondary | ICD-10-CM | POA: Diagnosis present

## 2012-05-17 DIAGNOSIS — I428 Other cardiomyopathies: Secondary | ICD-10-CM | POA: Diagnosis present

## 2012-05-17 DIAGNOSIS — F3289 Other specified depressive episodes: Secondary | ICD-10-CM | POA: Diagnosis present

## 2012-05-17 DIAGNOSIS — Z8673 Personal history of transient ischemic attack (TIA), and cerebral infarction without residual deficits: Secondary | ICD-10-CM

## 2012-05-17 DIAGNOSIS — Z888 Allergy status to other drugs, medicaments and biological substances status: Secondary | ICD-10-CM

## 2012-05-17 DIAGNOSIS — I5042 Chronic combined systolic (congestive) and diastolic (congestive) heart failure: Secondary | ICD-10-CM | POA: Diagnosis present

## 2012-05-17 DIAGNOSIS — Z882 Allergy status to sulfonamides status: Secondary | ICD-10-CM

## 2012-05-17 DIAGNOSIS — F329 Major depressive disorder, single episode, unspecified: Secondary | ICD-10-CM | POA: Diagnosis present

## 2012-05-17 DIAGNOSIS — I509 Heart failure, unspecified: Secondary | ICD-10-CM | POA: Diagnosis present

## 2012-05-17 DIAGNOSIS — I10 Essential (primary) hypertension: Secondary | ICD-10-CM | POA: Diagnosis present

## 2012-05-17 HISTORY — PX: LUMBAR LAMINECTOMY/DECOMPRESSION MICRODISCECTOMY: SHX5026

## 2012-05-17 SURGERY — LUMBAR LAMINECTOMY/DECOMPRESSION MICRODISCECTOMY 2 LEVELS
Anesthesia: General | Site: Spine Lumbar | Wound class: Clean

## 2012-05-17 MED ORDER — BUPIVACAINE LIPOSOME 1.3 % IJ SUSP
20.0000 mL | Freq: Once | INTRAMUSCULAR | Status: DC
  Filled 2012-05-17: qty 20

## 2012-05-17 MED ORDER — MENTHOL 3 MG MT LOZG: 1.0000 | LOZENGE | OROMUCOSAL | Status: DC | PRN

## 2012-05-17 MED ORDER — LEVOTHYROXINE SODIUM 100 MCG PO TABS
100.0000 ug | ORAL_TABLET | Freq: Every day | ORAL | Status: DC
  Administered 2012-05-18: 100 ug via ORAL
  Filled 2012-05-17 (×2): qty 1

## 2012-05-17 MED ORDER — CEFAZOLIN SODIUM-DEXTROSE 2-3 GM-% IV SOLR
2.0000 g | Freq: Three times a day (TID) | INTRAVENOUS | Status: AC
  Administered 2012-05-17 – 2012-05-18 (×2): 2 g via INTRAVENOUS
  Filled 2012-05-17 (×2): qty 50

## 2012-05-17 MED ORDER — ROCURONIUM BROMIDE 100 MG/10ML IV SOLN
INTRAVENOUS | Status: DC | PRN
  Administered 2012-05-17: 40 mg via INTRAVENOUS

## 2012-05-17 MED ORDER — FUROSEMIDE 20 MG PO TABS
20.0000 mg | ORAL_TABLET | Freq: Every morning | ORAL | Status: DC
  Administered 2012-05-18: 20 mg via ORAL
  Filled 2012-05-17: qty 1

## 2012-05-17 MED ORDER — LIDOCAINE HCL (CARDIAC) 20 MG/ML IV SOLN
INTRAVENOUS | Status: DC | PRN
  Administered 2012-05-17: 70 mg via INTRAVENOUS

## 2012-05-17 MED ORDER — THROMBIN 5000 UNITS EX KIT
PACK | CUTANEOUS | Status: DC | PRN
  Administered 2012-05-17 (×2): 5000 [IU] via TOPICAL

## 2012-05-17 MED ORDER — 0.9 % SODIUM CHLORIDE (POUR BTL) OPTIME
TOPICAL | Status: DC | PRN
  Administered 2012-05-17: 1000 mL

## 2012-05-17 MED ORDER — HEMOSTATIC AGENTS (NO CHARGE) OPTIME
TOPICAL | Status: DC | PRN
  Administered 2012-05-17: 1 via TOPICAL

## 2012-05-17 MED ORDER — HYDROCODONE-ACETAMINOPHEN 5-325 MG PO TABS
1.0000 | ORAL_TABLET | ORAL | Status: DC | PRN
Start: 2012-05-17 — End: 2012-05-18
  Administered 2012-05-17 – 2012-05-18 (×3): 2 via ORAL
  Filled 2012-05-17 (×3): qty 2

## 2012-05-17 MED ORDER — CEFAZOLIN SODIUM 1-5 GM-% IV SOLN
INTRAVENOUS | Status: DC | PRN
  Administered 2012-05-17: 2 g via INTRAVENOUS

## 2012-05-17 MED ORDER — ACETAMINOPHEN 325 MG PO TABS
650.0000 mg | ORAL_TABLET | ORAL | Status: DC | PRN
  Administered 2012-05-17: 650 mg via ORAL
  Filled 2012-05-17: qty 2

## 2012-05-17 MED ORDER — FENTANYL CITRATE 0.05 MG/ML IJ SOLN: 25.0000 ug | INTRAMUSCULAR | Status: DC | PRN

## 2012-05-17 MED ORDER — OXYCODONE-ACETAMINOPHEN 5-325 MG PO TABS: 1.0000 | ORAL_TABLET | ORAL | Status: DC | PRN

## 2012-05-17 MED ORDER — PANTOPRAZOLE SODIUM 40 MG PO TBEC
40.0000 mg | DELAYED_RELEASE_TABLET | Freq: Every day | ORAL | Status: DC
  Administered 2012-05-18: 40 mg via ORAL
  Filled 2012-05-17: qty 1

## 2012-05-17 MED ORDER — ESCITALOPRAM OXALATE 10 MG PO TABS
10.0000 mg | ORAL_TABLET | Freq: Every day | ORAL | Status: DC
  Administered 2012-05-18: 10 mg via ORAL
  Filled 2012-05-17: qty 1

## 2012-05-17 MED ORDER — GLYCOPYRROLATE 0.2 MG/ML IJ SOLN
INTRAMUSCULAR | Status: DC | PRN
  Administered 2012-05-17: 0.4 mg via INTRAVENOUS
  Administered 2012-05-17: 0.2 mg via INTRAVENOUS

## 2012-05-17 MED ORDER — DOCUSATE SODIUM 100 MG PO CAPS
100.0000 mg | ORAL_CAPSULE | Freq: Two times a day (BID) | ORAL | Status: DC
  Administered 2012-05-17 – 2012-05-18 (×2): 100 mg via ORAL
  Filled 2012-05-17 (×2): qty 1

## 2012-05-17 MED ORDER — LACTATED RINGERS IV SOLN
INTRAVENOUS | Status: DC
  Administered 2012-05-17: 22:00:00 via INTRAVENOUS

## 2012-05-17 MED ORDER — QUINAPRIL HCL 10 MG PO TABS
40.0000 mg | ORAL_TABLET | Freq: Every day | ORAL | Status: DC
  Administered 2012-05-17: 40 mg via ORAL
  Filled 2012-05-17 (×2): qty 4

## 2012-05-17 MED ORDER — ONDANSETRON HCL 4 MG/2ML IJ SOLN
4.0000 mg | INTRAMUSCULAR | Status: DC | PRN
  Administered 2012-05-18: 4 mg via INTRAVENOUS
  Filled 2012-05-17: qty 2

## 2012-05-17 MED ORDER — LACTATED RINGERS IV SOLN
INTRAVENOUS | Status: DC | PRN
  Administered 2012-05-17 (×2): via INTRAVENOUS

## 2012-05-17 MED ORDER — BACITRACIN 50000 UNITS IM SOLR
INTRAMUSCULAR | Status: AC
  Filled 2012-05-17: qty 1

## 2012-05-17 MED ORDER — BUPIVACAINE-EPINEPHRINE PF 0.5-1:200000 % IJ SOLN
INTRAMUSCULAR | Status: DC | PRN
  Administered 2012-05-17: 10 mL

## 2012-05-17 MED ORDER — OXYCODONE HCL 5 MG/5ML PO SOLN: 5.0000 mg | Freq: Once | ORAL | Status: DC | PRN

## 2012-05-17 MED ORDER — PHENOL 1.4 % MT LIQD: 1.0000 | OROMUCOSAL | Status: DC | PRN

## 2012-05-17 MED ORDER — MEPERIDINE HCL 25 MG/ML IJ SOLN: 6.2500 mg | INTRAMUSCULAR | Status: DC | PRN

## 2012-05-17 MED ORDER — SODIUM CHLORIDE 0.9 % IV SOLN
INTRAVENOUS | Status: AC
  Filled 2012-05-17: qty 500

## 2012-05-17 MED ORDER — MORPHINE SULFATE 2 MG/ML IJ SOLN: 1.0000 mg | INTRAMUSCULAR | Status: DC | PRN

## 2012-05-17 MED ORDER — ONDANSETRON HCL 4 MG/2ML IJ SOLN
INTRAMUSCULAR | Status: DC | PRN
  Administered 2012-05-17: 4 mg via INTRAVENOUS

## 2012-05-17 MED ORDER — ACETAMINOPHEN 650 MG RE SUPP: 650.0000 mg | RECTAL | Status: DC | PRN

## 2012-05-17 MED ORDER — SODIUM CHLORIDE 0.9 % IR SOLN
Status: DC | PRN
  Administered 2012-05-17: 15:00:00

## 2012-05-17 MED ORDER — NEOSTIGMINE METHYLSULFATE 1 MG/ML IJ SOLN
INTRAMUSCULAR | Status: DC | PRN
  Administered 2012-05-17: 3 mg via INTRAVENOUS

## 2012-05-17 MED ORDER — DIAZEPAM 2 MG PO TABS
2.0000 mg | ORAL_TABLET | Freq: Four times a day (QID) | ORAL | Status: DC | PRN
  Administered 2012-05-18: 2 mg via ORAL
  Filled 2012-05-17: qty 1

## 2012-05-17 MED ORDER — PROMETHAZINE HCL 25 MG/ML IJ SOLN: 6.2500 mg | INTRAMUSCULAR | Status: DC | PRN

## 2012-05-17 MED ORDER — OXYCODONE HCL 5 MG PO TABS: 5.0000 mg | ORAL_TABLET | Freq: Once | ORAL | Status: DC | PRN

## 2012-05-17 MED ORDER — PROPOFOL 10 MG/ML IV BOLUS
INTRAVENOUS | Status: DC | PRN
  Administered 2012-05-17 (×2): 30 mg via INTRAVENOUS
  Administered 2012-05-17: 100 mg via INTRAVENOUS

## 2012-05-17 MED ORDER — FENTANYL CITRATE 0.05 MG/ML IJ SOLN
INTRAMUSCULAR | Status: DC | PRN
  Administered 2012-05-17 (×3): 50 ug via INTRAVENOUS

## 2012-05-17 MED ORDER — ZOLPIDEM TARTRATE 5 MG PO TABS: 5.0000 mg | ORAL_TABLET | Freq: Every evening | ORAL | Status: DC | PRN

## 2012-05-17 MED ORDER — BUPIVACAINE LIPOSOME 1.3 % IJ SUSP
INTRAMUSCULAR | Status: DC | PRN
  Administered 2012-05-17: 20 mL

## 2012-05-17 MED ORDER — METOPROLOL SUCCINATE ER 50 MG PO TB24
50.0000 mg | ORAL_TABLET | Freq: Every day | ORAL | Status: DC
  Filled 2012-05-17: qty 1

## 2012-05-17 MED ORDER — BACITRACIN ZINC 500 UNIT/GM EX OINT
TOPICAL_OINTMENT | CUTANEOUS | Status: DC | PRN
  Administered 2012-05-17: 1 via TOPICAL

## 2012-05-17 SURGICAL SUPPLY — 60 items
APL SKNCLS STERI-STRIP NONHPOA (GAUZE/BANDAGES/DRESSINGS) ×1
BAG DECANTER FOR FLEXI CONT (MISCELLANEOUS) ×2 IMPLANT
BENZOIN TINCTURE PRP APPL 2/3 (GAUZE/BANDAGES/DRESSINGS) ×2 IMPLANT
BLADE SURG ROTATE 9660 (MISCELLANEOUS) IMPLANT
BRUSH SCRUB EZ PLAIN DRY (MISCELLANEOUS) ×2 IMPLANT
BUR ACORN 6.0 (BURR) ×2 IMPLANT
BUR MATCHSTICK NEURO 3.0 LAGG (BURR) ×2 IMPLANT
CANISTER SUCTION 2500CC (MISCELLANEOUS) ×2 IMPLANT
CLOTH BEACON ORANGE TIMEOUT ST (SAFETY) ×2 IMPLANT
CONT SPEC 4OZ CLIKSEAL STRL BL (MISCELLANEOUS) ×2 IMPLANT
COVER MAYO STAND STRL (DRAPES) ×1 IMPLANT
DRAPE LAPAROTOMY 100X72X124 (DRAPES) ×2 IMPLANT
DRAPE MICROSCOPE LEICA (MISCELLANEOUS) ×1 IMPLANT
DRAPE POUCH INSTRU U-SHP 10X18 (DRAPES) ×2 IMPLANT
DRAPE SURG 17X23 STRL (DRAPES) ×8 IMPLANT
ELECT BLADE 4.0 EZ CLEAN MEGAD (MISCELLANEOUS) ×2
ELECT REM PT RETURN 9FT ADLT (ELECTROSURGICAL) ×2
ELECTRODE BLDE 4.0 EZ CLN MEGD (MISCELLANEOUS) ×1 IMPLANT
ELECTRODE REM PT RTRN 9FT ADLT (ELECTROSURGICAL) ×1 IMPLANT
GAUZE SPONGE 4X4 16PLY XRAY LF (GAUZE/BANDAGES/DRESSINGS) ×1 IMPLANT
GLOVE BIO SURGEON STRL SZ8.5 (GLOVE) ×2 IMPLANT
GLOVE BIOGEL PI IND STRL 7.5 (GLOVE) IMPLANT
GLOVE BIOGEL PI IND STRL 8.5 (GLOVE) IMPLANT
GLOVE BIOGEL PI INDICATOR 7.5 (GLOVE) ×1
GLOVE BIOGEL PI INDICATOR 8.5 (GLOVE) ×1
GLOVE ECLIPSE 6.5 STRL STRAW (GLOVE) ×1 IMPLANT
GLOVE EXAM NITRILE LRG STRL (GLOVE) IMPLANT
GLOVE EXAM NITRILE MD LF STRL (GLOVE) IMPLANT
GLOVE EXAM NITRILE XL STR (GLOVE) IMPLANT
GLOVE EXAM NITRILE XS STR PU (GLOVE) IMPLANT
GLOVE SS BIOGEL STRL SZ 7 (GLOVE) IMPLANT
GLOVE SS BIOGEL STRL SZ 8 (GLOVE) ×1 IMPLANT
GLOVE SUPERSENSE BIOGEL SZ 7 (GLOVE) ×2
GLOVE SUPERSENSE BIOGEL SZ 8 (GLOVE) ×1
GLOVE SURG SS PI 8.0 STRL IVOR (GLOVE) ×2 IMPLANT
GOWN BRE IMP SLV AUR LG STRL (GOWN DISPOSABLE) ×2 IMPLANT
GOWN BRE IMP SLV AUR XL STRL (GOWN DISPOSABLE) ×2 IMPLANT
GOWN STRL REIN 2XL LVL4 (GOWN DISPOSABLE) ×1 IMPLANT
KIT BASIN OR (CUSTOM PROCEDURE TRAY) ×2 IMPLANT
KIT ROOM TURNOVER OR (KITS) ×2 IMPLANT
NDL HYPO 21X1.5 SAFETY (NEEDLE) IMPLANT
NEEDLE HYPO 21X1.5 SAFETY (NEEDLE) ×2 IMPLANT
NEEDLE HYPO 22GX1.5 SAFETY (NEEDLE) ×2 IMPLANT
NS IRRIG 1000ML POUR BTL (IV SOLUTION) ×2 IMPLANT
PACK LAMINECTOMY NEURO (CUSTOM PROCEDURE TRAY) ×2 IMPLANT
PAD ARMBOARD 7.5X6 YLW CONV (MISCELLANEOUS) ×9 IMPLANT
PATTIES SURGICAL .5 X1 (DISPOSABLE) ×1 IMPLANT
RUBBERBAND STERILE (MISCELLANEOUS) ×2 IMPLANT
SPONGE GAUZE 4X4 12PLY (GAUZE/BANDAGES/DRESSINGS) ×2 IMPLANT
SPONGE SURGIFOAM ABS GEL SZ50 (HEMOSTASIS) ×2 IMPLANT
STRIP CLOSURE SKIN 1/2X4 (GAUZE/BANDAGES/DRESSINGS) ×2 IMPLANT
SUT VIC AB 1 CT1 18XBRD ANBCTR (SUTURE) ×2 IMPLANT
SUT VIC AB 1 CT1 8-18 (SUTURE) ×2
SUT VIC AB 2-0 CP2 18 (SUTURE) ×3 IMPLANT
SYR 20CC LL (SYRINGE) ×1 IMPLANT
SYR 20ML ECCENTRIC (SYRINGE) ×2 IMPLANT
TAPE CLOTH SURG 4X10 WHT LF (GAUZE/BANDAGES/DRESSINGS) ×1 IMPLANT
TOWEL OR 17X24 6PK STRL BLUE (TOWEL DISPOSABLE) ×2 IMPLANT
TOWEL OR 17X26 10 PK STRL BLUE (TOWEL DISPOSABLE) ×2 IMPLANT
WATER STERILE IRR 1000ML POUR (IV SOLUTION) ×2 IMPLANT

## 2012-05-17 NOTE — Anesthesia Postprocedure Evaluation (Signed)
  Anesthesia Post-op Note  Patient: Leslie [name]  Procedure(s) Performed: Procedure(s) (LRB) with comments: LUMBAR LAMINECTOMY/DECOMPRESSION MICRODISCECTOMY 2 LEVELS (N/A) - Lumbar four laminectomy with bilateral Lumbar three laminotomies and removal of synovial cyst  Patient Location: PACU  Anesthesia Type: General  Level of Consciousness: awake  Airway and Oxygen Therapy: Patient Spontanous Breathing  Post-op Pain: mild  Post-op Assessment: Post-op Vital signs reviewed  Post-op Vital Signs: stable  Complications: No apparent anesthesia complications

## 2012-05-17 NOTE — Preoperative (Signed)
Beta Blockers   Reason not to administer Beta Blockers:Not Applicable, took this AM at 0800

## 2012-05-17 NOTE — Transfer of Care (Signed)
Immediate Anesthesia Transfer of Care Note  Patient: Leslie [name]  Procedure(s) Performed: Procedure(s) (LRB) with comments: LUMBAR LAMINECTOMY/DECOMPRESSION MICRODISCECTOMY 2 LEVELS (N/A) - Lumbar four laminectomy with bilateral Lumbar three laminotomies and removal of synovial cyst  Patient Location: PACU  Anesthesia Type: General  Level of Consciousness: awake, alert , oriented and patient cooperative  Airway & Oxygen Therapy: Patient Spontanous Breathing and Patient connected to nasal cannula oxygen  Post-op Assessment: Report given to PACU RN, Post -op Vital signs reviewed and stable and Patient moving all extremities  Post vital signs: Reviewed and stable  Complications: No apparent anesthesia complications

## 2012-05-17 NOTE — Progress Notes (Signed)
Patient ID: Leslie [name], female   DOB: June 04, 1931, 78 y.o.   MRN: [id number] Subjective:  This is a delayed no foreign I saw the patient back in. The patient is alert and pleasant. She looks well. She has no complaints.  Objective: Vital signs in last 24 hours: Temp:  [96.8 F (36 C)-98.3 F (36.8 C)] 98.3 F (36.8 C) (10/09 1633) Pulse Rate:  [57-72] 68  (10/09 1633) Resp:  [11-20] 12  (10/09 1615) BP: (161-189)/(62-88) 180/75 mmHg (10/09 1633) SpO2:  [95 %-100 %] 95 % (10/09 1633) Weight:  [71.1 kg (156 lb 12 oz)] 71.1 kg (156 lb 12 oz) (10/09 1048)  Intake/Output from previous day:   Intake/Output this shift: Total I/O In: 1500 [I.V.:1500] Out: 50 [Blood:50]  Physical exam the patient is moving her lower extremities well.  Lab Results: No results found for this basename: WBC:2,HGB:2,HCT:2,PLT:2 in the last 72 hours BMET No results found for this basename: NA:2,K:2,CL:2,CO2:2,GLUCOSE:2,BUN:2,CREATININE:2,CALCIUM:2 in the last 72 hours  Studies/Results: Dg Lumbar Spine 1 View  05/17/2012  *RADIOLOGY REPORT*  Clinical Data: Bilateral L3 laminectomies.  LUMBAR SPINE - 1 VIEW  Comparison: MRI 04/27/2011  Findings: Using the numbering convention of comparison MRI, there is a blunt tip probe positioned in just superior to the spinous process of the L5 vertebral body.  IMPRESSION: Single intraoperative view of lumbar spine as above.   Original Report Authenticated By: Genevive Bi, M.D.     Assessment/Plan: The patient is doing well.  LOS: 0 days     Joeangel Jeanpaul D 05/17/2012, 5:59 PM

## 2012-05-17 NOTE — H&P (Signed)
Subjective: The patient is a 78 year old white female who has complained of back and left leg pain. She has failed medical management and was worked up with a lumbar MRI. This demonstrated the patient had spinal stenosis and a synovial cyst at L3-4 and L4-5. I discussed the various treatment options with the patient including surgery. She has weighed the risks, benefits, and alternatives surgery decided proceed with a decompressive laminectomy with removal of the synovial cyst.   Past Medical History  Diagnosis Date  . Hypertension   . Heart failure, left-sided   . MI (myocardial infarction)   . Bundle branch block left   . Stroke     blurred vision residual  . Shortness of breath     with exertion  . GERD (gastroesophageal reflux disease)     tums  . Arthritis   . Anemia   . Depression   . CHF (congestive heart failure)     chronic combine CHF  . Nonischemic cardiomyopathy     '01 EF 30% with normal coronaries; EF 45-50% 03/2012    Past Surgical History  Procedure Date  . Hernia repair     hiatial  . Eye surgery     bilateral cataracts  . Detached retinia   . Tonsillectomy   . Cardiac catheterization     13 yrs. ago    Allergies  Allergen Reactions  . Dilaudid (Hydromorphone Hcl) Nausea Only  . Sulfa Antibiotics Other (See Comments)    "urine crystallizes"  . Xopenex (Levalbuterol Hcl)     "gives me the shakes"    History  Substance Use Topics  . Smoking status: Never Smoker   . Smokeless tobacco: Not on file  . Alcohol Use: No    History reviewed. No pertinent family history. Prior to Admission medications   Medication Sig Start Date End Date Taking? Authorizing Provider  acetaminophen (TYLENOL) 325 MG tablet Take 650 mg by mouth every 6 (six) hours as needed. For pain/fever   Yes Historical Provider, MD  Calcium Carbonate-Vit D-Min (CALCIUM 1200 PO) Take 1 tablet by mouth daily.   Yes Historical Provider, MD  cyanocobalamin (,VITAMIN B-12,) 1000 MCG/ML  injection Inject 1,000 mcg into the muscle every 30 (thirty) days.   Yes Historical Provider, MD  escitalopram (LEXAPRO) 10 MG tablet Take 10 mg by mouth daily.   Yes Historical Provider, MD  furosemide (LASIX) 20 MG tablet Take 20 mg by mouth every morning.   Yes Historical Provider, MD  levothyroxine (SYNTHROID, LEVOTHROID) 100 MCG tablet Take 100 mcg by mouth every morning.   Yes Historical Provider, MD  LORazepam (ATIVAN) 1 MG tablet Take 1 mg by mouth at bedtime as needed. For anxiety/restlessness   Yes Historical Provider, MD  metoprolol succinate (TOPROL-XL) 50 MG 24 hr tablet Take 50 mg by mouth every morning. Take with or immediately following a meal.   Yes Historical Provider, MD  quinapril (ACCUPRIL) 40 MG tablet Take 40 mg by mouth at bedtime.   Yes Historical Provider, MD  RABEprazole (ACIPHEX) 20 MG tablet Take 20 mg by mouth daily as needed. For indigestion   Yes Historical Provider, MD     Review of Systems  Positive ROS: As above  All other systems have been reviewed and were otherwise negative with the exception of those mentioned in the HPI and as above.  Objective: Vital signs in last 24 hours: Temp:  [97.8 F (36.6 C)] 97.8 F (36.6 C) (10/09 1045) Pulse Rate:  [62] 62  (10/09  1045) Resp:  [18] 18  (10/09 1045) BP: (176)/(73) 176/73 mmHg (10/09 1045) SpO2:  [98 %] 98 % (10/09 1045) Weight:  [71.1 kg (156 lb 12 oz)] 71.1 kg (156 lb 12 oz) (10/09 1048)  General Appearance: Alert, cooperative, no distress, appears stated age Head: Normocephalic, without obvious abnormality, atraumatic Eyes: PERRL, conjunctiva/corneas clear, EOM's intact, fundi benign, both eyes      Ears: Normal TM's and external ear canals, both ears Throat: Lips, mucosa, and tongue normal; teeth and gums normal Neck: Supple, symmetrical, trachea midline, no adenopathy; thyroid: No enlargement/tenderness/nodules; no carotid bruit or JVD Back: Symmetric, no curvature, ROM normal, no CVA  tenderness Lungs: Clear to auscultation bilaterally, respirations unlabored Heart: Regular rate and rhythm, S1 and S2 normal, no murmur, rub or gallop Abdomen: Soft, non-tender, bowel sounds active all four quadrants, no masses, no organomegaly Extremities: Extremities normal, atraumatic, no cyanosis or edema Pulses: 2+ and symmetric all extremities Skin: Skin color, texture, turgor normal, no rashes or lesions  NEUROLOGIC:   Mental status: alert and oriented, no aphasia, good attention span, Fund of knowledge/ memory ok Motor Exam - grossly normal Sensory Exam - grossly normal Reflexes:  Coordination - grossly normal Gait - grossly normal Balance - grossly normal Cranial Nerves: I: smell Not tested  II: visual acuity  OS: Normal    OD: Normal   II: visual fields Full to confrontation  II: pupils Equal, round, reactive to light  III,VII: ptosis None  III,IV,VI: extraocular muscles  Full ROM  V: mastication Normal  V: facial light touch sensation  Normal  V,VII: corneal reflex  Present  VII: facial muscle function - upper  Normal  VII: facial muscle function - lower Normal  VIII: hearing Not tested  IX: soft palate elevation  Normal  IX,X: gag reflex Present  XI: trapezius strength  5/5  XI: sternocleidomastoid strength 5/5  XI: neck flexion strength  5/5  XII: tongue strength  Normal    Data Review Lab Results  Component Value Date   WBC 6.1 05/10/2012   HGB 13.2 05/10/2012   HCT 41.2 05/10/2012   MCV 90.0 05/10/2012   PLT 216 05/10/2012   Lab Results  Component Value Date   NA 141 05/10/2012   K 4.5 05/10/2012   CL 105 05/10/2012   CO2 28 05/10/2012   BUN 14 05/10/2012   CREATININE 0.82 05/10/2012   GLUCOSE 95 05/10/2012   Lab Results  Component Value Date   INR 0.92 09/30/2011    Assessment/Plan: L3-4 and L4-5 spinal stenosis, synovial cyst, lumbago, lumbar radiculopathy, neurogenic claudication: I have discussed situation with the patient. I reviewed her MR scan  with her and pointed out the abnormalities. We have discussed the various treatment options including a decompressive laminectomy with removal of the synovial cyst. I have described the surgery to her. I have shown her surgical models. We have discussed the risks, benefits, alternatives, and likelihood of achieving our goals with surgery. I have answered all the patient's questions. She wants to proceed with surgery.   Desarae Placide D 05/17/2012 1:10 PM

## 2012-05-17 NOTE — Anesthesia Preprocedure Evaluation (Signed)
Anesthesia Evaluation  Patient identified by MRN, date of birth, ID band Patient awake    Reviewed: Allergy & Precautions, H&P , NPO status , Patient's Chart, lab work & pertinent test results  History of Anesthesia Complications Negative for: history of anesthetic complications  Airway Mallampati: II  Neck ROM: Full    Dental  (+) Teeth Intact   Pulmonary shortness of breath,  breath sounds clear to auscultation        Cardiovascular hypertension, +CHF + dysrhythmias Rhythm:Regular Rate:Normal + Systolic murmurs    Neuro/Psych CVA    GI/Hepatic Neg liver ROS, GERD-  ,  Endo/Other  negative endocrine ROS  Renal/GU negative Renal ROS     Musculoskeletal   Abdominal   Peds  Hematology negative hematology ROS (+)   Anesthesia Other Findings   Reproductive/Obstetrics                           Anesthesia Physical Anesthesia Plan  ASA: III  Anesthesia Plan: General   Post-op Pain Management:    Induction: Intravenous  Airway Management Planned: Oral ETT  Additional Equipment:   Intra-op Plan:   Post-operative Plan: Extubation in OR  Informed Consent: I have reviewed the patients History and Physical, chart, labs and discussed the procedure including the risks, benefits and alternatives for the proposed anesthesia with the patient or authorized representative who has indicated his/her understanding and acceptance.   Dental advisory given  Plan Discussed with: CRNA and Surgeon  Anesthesia Plan Comments:         Anesthesia Quick Evaluation

## 2012-05-17 NOTE — Op Note (Signed)
Brief history: The patient is a 78 year old white female who has complained of back and radicular leg pain consistent with a lumbar radiculopathy. She has failed medical management and was worked up with a lumbar MRI. This demonstrated patient had spinal stenosis and a synovial cyst at L4-5 and L3-4. I discussed the various treatment option with the patient and her daughter. The patient has weighed the risks, benefits, and alternatives surgery and decided proceed with a decompressive lumbar laminectomy.  Preoperative diagnosis: L3-4 and L4-5 spinal stenosis, L4-5 synovial cyst, lumbago, lumbar radiculopathy.  Postoperative diagnosis: The same  Procedure: L4 laminectomy with bilateral L3 laminotomies to treat his spinal stenosis at L3-4 and L4-5 and decompress the bilateral L4 and L5 nerve roots using microdissection. Surgeon: Dr. Delma Officer  Asst.: Dr. Barbaraann Barthel  Anesthesia: Gen. endotracheal  Estimated blood loss: 50 cc  Drains: None  Complications: None  Description of procedure: The patient was brought to the operating room by the anesthesia team. General endotracheal anesthesia was induced. The patient was turned to the prone position on the Wilson frame. The patient's lumbosacral region was then prepared with Betadine scrub and Betadine solution. Sterile drapes were applied.  I then injected the area to be incised with Marcaine with epinephrine solution. I then used a scalpel to make a linear midline incision over the L3-4 and L4-5 intervertebral disc space. I then used electrocautery to perform a bilateral  subperiosteal dissection exposing the spinous process and lamina of L3, L4 and L5. We obtained intraoperative radiograph to confirm our location. I then inserted the [facility] retractor for exposure. I then incised the interspinous ligament at L3-4 and L4-5 with a scalpel. I then used the Leksell nodule were to remove the L4 spinous process and the caudal aspect of the L3 spinous  process.  We then brought the operative microscope into the field. Under its magnification and illumination we completed the microdissection. I used a high-speed drill to perform bilateral laminotomies at L3 and L4.. I then used a Kerrison punches to complete the laminectomy at L4 as well as to widen the L3 laminotomies. I used Leksell nodule were to remove the ligamentum flavum at L3-4 and L4-5. We did encounter a synovial cyst at L4-5 on the left as expected. We removed it using the Kerrison punches decompressing the thecal sac. We then used microdissection to free up the thecal sac and the bilateral L4 and L5 nerve root from the epidural tissue. I then used a Kerrison punch to perform a foraminotomy at about the bilateral L4 and L5 nerve root. I then inspected the intervertebral disc at L3-4 and L4-5. There was no significant herniations.  I then palpated along the ventral surface of the thecal sac and along exit route of the bilateral L4 and L5 nerve root and noted that the neural structures were well decompressed. This completed the decompression.  We then obtained hemostasis using bipolar electrocautery. We irrigated the wound out with bacitracin solution. We then removed the retractor. We then reapproximated the patient's thoracolumbar fascia with interrupted #1 Vicryl suture. We then reapproximated the patient's subcutaneous tissue with interrupted 3-0 Vicryl suture. We then reapproximated patient's skin with Steri-Strips and benzoin. The was then coated with bacitracin ointment. The drapes were removed. The patient was subsequently returned to the supine position where they were extubated by the anesthesia team. The patient was then transported to the postanesthesia care unit in stable condition. All sponge instrument and needle counts were reportedly correct at the end of  this case.

## 2012-05-18 MED ORDER — OXYCODONE-ACETAMINOPHEN 5-325 MG PO TABS: 1.0000 | ORAL_TABLET | ORAL | Status: DC | PRN

## 2012-05-18 MED ORDER — DSS 100 MG PO CAPS: 100.0000 mg | ORAL_CAPSULE | Freq: Two times a day (BID) | ORAL | Status: DC

## 2012-05-18 MED ORDER — DIAZEPAM 2 MG PO TABS: 2.0000 mg | ORAL_TABLET | Freq: Four times a day (QID) | ORAL | Status: DC | PRN

## 2012-05-18 NOTE — Progress Notes (Signed)
Pt. Alert and oriented,follows simple instructions, denies pain. Incision area without swelling, redness or S/S of infection. Voiding adequate clear yellow urine. Moving all extremities well and vitals stable and documented. Lumbar surgery notes instructions given to patient and family member for home safety and precautions. Pt. and family stated understanding of instructions given.  

## 2012-05-18 NOTE — Progress Notes (Signed)
UR COMPLETED  

## 2012-05-18 NOTE — Evaluation (Signed)
Physical Therapy Evaluation Patient Details Name: Leslie [name] MRN: [id number] DOB: [birth year] Today's Date: 05/18/2012 Time: 4098-1191 PT Time Calculation (min): 22 min  PT Assessment / Plan / Recommendation Clinical Impression  Pt admitted with synovial cyst L3-5 s/p laminectomy. Pt progressing well but slightly unsteady with gait and lives alone with potential ability for dgtr-in-law to stay a few days for safety. Pt will benefit from acute therapy to maximize mobility, transfers, function and independence prior to return home to decrease fall risk.     PT Assessment  Patient needs continued PT services    Follow Up Recommendations  Home health PT    Does the patient have the potential to tolerate intense rehabilitation      Barriers to Discharge Decreased caregiver support      Equipment Recommendations  Rolling walker with 5" wheels    Recommendations for Other Services     Frequency Min 5X/week    Precautions / Restrictions Precautions Precautions: Back Precaution Booklet Issued: Yes (comment) Precaution Comments: pt educated for all precautions and verbalized understanding   Pertinent Vitals/Pain 5/10 surgical pain, no radiating pain at present, premedicated, repositioned      Mobility  Bed Mobility Bed Mobility: Rolling Left;Left Sidelying to Sit Rolling Left: 5: Supervision Left Sidelying to Sit: 5: Supervision;HOB flat Details for Bed Mobility Assistance: cueing for sequence and precautions Transfers Transfers: Sit to Stand;Stand to Sit Sit to Stand: 5: Supervision;From bed Stand to Sit: 6: Modified independent (Device/Increase time);To chair/3-in-1;With armrests Details for Transfer Assistance: cueing for posture and precautions Ambulation/Gait Ambulation/Gait Assistance: 4: Min guard Ambulation Distance (Feet): 300 Feet Assistive device: None Ambulation/Gait Assistance Details: Pt with 2 periods of unsteady gait with assist x 1 to recover balance,  slow gait Gait Pattern: Step-through pattern;Decreased stride length Gait velocity: decreased Stairs: Yes Stairs Assistance: 5: Supervision Stairs Assistance Details (indicate cue type and reason): cueing for sequence and safety to prevent twisting with rail use Stair Management Technique: One rail Left;Sideways Number of Stairs: 4     Shoulder Instructions     Exercises     PT Diagnosis: Difficulty walking;Acute pain  PT Problem List: Decreased activity tolerance;Decreased balance;Decreased mobility;Decreased knowledge of precautions;Pain PT Treatment Interventions: DME instruction;Gait training;Functional mobility training;Therapeutic activities;Patient/family education   PT Goals Acute Rehab PT Goals PT Goal Formulation: With patient Time For Goal Achievement: 05/25/12 Potential to Achieve Goals: Good Pt will go Supine/Side to Sit: with modified independence;with HOB 0 degrees PT Goal: Supine/Side to Sit - Progress: Goal set today Pt will go Sit to Supine/Side: with modified independence;with HOB 0 degrees PT Goal: Sit to Supine/Side - Progress: Goal set today Pt will go Sit to Stand: with modified independence PT Goal: Sit to Stand - Progress: Goal set today Pt will go Stand to Sit: with modified independence PT Goal: Stand to Sit - Progress: Goal set today Pt will Ambulate: >150 feet;with modified independence;with least restrictive assistive device PT Goal: Ambulate - Progress: Goal set today Pt will Go Up / Down Stairs: 3-5 stairs;with rail(s);with modified independence PT Goal: Up/Down Stairs - Progress: Goal set today Additional Goals Additional Goal #1: Pt will independently state and demonstrate adherence to 3/3 precautions PT Goal: Additional Goal #1 - Progress: Goal set today  Visit Information  Last PT Received On: 05/18/12 Assistance Needed: +1    Subjective Data  Subjective: I still make drapes for money Patient Stated Goal: be able to return home and care  for myself   Prior Functioning  Home  Living Lives With: Alone Available Help at Discharge: Family;Available 24 hours/day (dgtr-in-law 24hrs for at least a few days) Type of Home: House Home Access: Stairs to enter [employer] of Steps: 3 Entrance Stairs-Rails: Left Home Layout: One level Bathroom Shower/Tub: Walk-in shower;Tub only Bathroom Toilet: Handicapped height Home Adaptive Equipment: Straight cane Prior Function Level of Independence: Independent Able to Take Stairs?: Yes Driving: Yes Vocation: Retired Musician: No difficulties    Cognition  Overall Cognitive Status: Appears within functional limits for tasks assessed/performed Arousal/Alertness: Awake/alert Orientation Level: Appears intact for tasks assessed Behavior During Session: [facility] for tasks performed    Extremity/Trunk Assessment Right Lower Extremity Assessment RLE ROM/Strength/Tone: [facility] for tasks assessed;Deficits RLE ROM/Strength/Tone Deficits: knee flexion and extension 3/5 due to arthritic knee pain RLE Sensation: WFL - Light Touch RLE Coordination: WFL - gross/fine motor Left Lower Extremity Assessment LLE ROM/Strength/Tone: WFL for tasks assessed (4/5 strength) LLE Sensation: WFL - Light Touch LLE Coordination: WFL - gross/fine motor Trunk Assessment Trunk Assessment: Normal   Balance    End of Session PT - End of Session Activity Tolerance: Patient tolerated treatment well Patient left: in chair;with call bell/phone within reach Nurse Communication: Mobility status  GP     Toney Sang Beth 05/18/2012, 8:21 AM  Delaney Meigs, PT [phone]

## 2012-05-18 NOTE — Evaluation (Signed)
Occupational Therapy Evaluation Patient Details Name: Leslie [name] MRN: [id number] DOB: 11/25/1930 Today's Date: 05/18/2012 Time: 7829-5621 OT Time Calculation (min): 31 min  OT Assessment / Plan / Recommendation Clinical Impression  Pt admitted with synovial cyst L3-5 s/p laminectomy. Pt will benefit from skilled OT in the acute setting to maximize I with ADL and ADL mobility prior to d/c home. Will not require follow up OT at home.    OT Assessment  Patient needs continued OT Services    Follow Up Recommendations  No OT follow up;Supervision/Assistance - 24 hour    Barriers to Discharge      Equipment Recommendations  None recommended by OT    Recommendations for Other Services    Frequency  Min 2X/week    Precautions / Restrictions Precautions Precautions: Back Precaution Comments:  (pt able to recall 2/3 back precautions; re-educated on 3/3) Restrictions Weight Bearing Restrictions: No   Pertinent Vitals/Pain Pt reports 4/10 back pain and reports she is pre-medicated    ADL  Eating/Feeding: Performed;Independent Where Assessed - Eating/Feeding: Chair Grooming: Performed;Wash/dry hands;Independent Where Assessed - Grooming: Unsupported standing Upper Body Bathing: Simulated;Supervision/safety Where Assessed - Upper Body Bathing: Unsupported standing Lower Body Bathing: Simulated;Min guard Where Assessed - Lower Body Bathing: Unsupported standing Upper Body Dressing: Simulated;Set up;Supervision/safety Where Assessed - Upper Body Dressing: Unsupported sitting Lower Body Dressing: Simulated;Moderate assistance Where Assessed - Lower Body Dressing: Unsupported sit to stand Toilet Transfer: Simulated;Supervision/safety Toilet Transfer Method: Sit to Barista: Regular height toilet;Grab bars Toileting - Clothing Manipulation and Hygiene: Simulated;Minimal assistance Where Assessed - Engineer, mining and Hygiene: Sit to stand from  3-in-1 or toilet Tub/Shower Transfer: Simulated;Min guard Tub/Shower Transfer Method: Science writer: Walk in shower Equipment Used: Gait belt Transfers/Ambulation Related to ADLs: Pt Min guard A without AD and S with RW ambulation throughout room and into hallway. ADL Comments: Pt educated on impact of back precautions on BADLs and IADLs. Also, PT recommended tub/shower bench, however, pt declined this stating it would not fit in shower.    OT Diagnosis: Generalized weakness;Acute pain  OT Problem List: Decreased activity tolerance;Impaired balance (sitting and/or standing);Decreased knowledge of use of DME or AE;Decreased knowledge of precautions;Pain OT Treatment Interventions: Self-care/ADL training;DME and/or AE instruction;Therapeutic activities;Patient/family education;Balance training   OT Goals Acute Rehab OT Goals OT Goal Formulation: With patient Time For Goal Achievement: 05/25/12 Potential to Achieve Goals: Good ADL Goals Pt Will Perform Upper Body Dressing: Independently;Sitting, bed;Sitting, chair ADL Goal: Upper Body Dressing - Progress: Goal set today Pt Will Perform Lower Body Dressing: with modified independence;with adaptive equipment;Sit to stand from chair;Sit to stand from bed ADL Goal: Lower Body Dressing - Progress: Goal set today Pt Will Transfer to Toilet: with modified independence;Ambulation;with DME ADL Goal: Toilet Transfer - Progress: Goal set today Pt Will Perform Toileting - Hygiene: with modified independence;with adaptive equipment;Sit to stand from 3-in-1/toilet ADL Goal: Toileting - Hygiene - Progress: Goal set today Pt Will Perform Tub/Shower Transfer: Shower transfer;with modified independence;Ambulation ADL Goal: Tub/Shower Transfer - Progress: Goal set today Additional ADL Goal #1: Pt will verbalize/generalize 3/3 back precautions for use with functional activities. ADL Goal: Additional Goal #1 - Progress: Goal set  today  Visit Information  Last OT Received On: 05/18/12 Assistance Needed: +1    Subjective Data  Subjective: Pt agreeable to OOB with therapy Patient Stated Goal: Return home    Prior Functioning     Home Living Lives With: Alone Available Help at Discharge: Family;Available  24 hours/day (dtr-in-law can stay prn) Type of Home: House Home Access: Stairs to enter [employer] of Steps: 3 Entrance Stairs-Rails: Left Home Layout: One level Bathroom Shower/Tub: Walk-in shower;Tub only Bathroom Toilet: Handicapped height Home Adaptive Equipment: Straight cane Prior Function Level of Independence: Independent Able to Take Stairs?: Yes Driving: Yes Vocation: Retired Musician: No difficulties Dominant Hand: Right         Vision/Perception     Cognition  Overall Cognitive Status: Appears within functional limits for tasks assessed/performed Arousal/Alertness: Awake/alert Orientation Level: Appears intact for tasks assessed Behavior During Session: [facility] for tasks performed    Extremity/Trunk Assessment Right Upper Extremity Assessment RUE ROM/Strength/Tone: WFL for tasks assessed RUE Sensation: WFL - Light Touch RUE Coordination: WFL - gross/fine motor Left Upper Extremity Assessment LUE ROM/Strength/Tone: WFL for tasks assessed LUE Sensation: WFL - Light Touch LUE Coordination: WFL - gross/fine motor     Mobility Bed Mobility Rolling Left: 5: Supervision Left Sidelying to Sit: 5: Supervision;HOB flat Details for Bed Mobility Assistance: cueing for sequence and precautions Transfers Sit to Stand: 5: Supervision;From bed;From toilet Stand to Sit: 6: Modified independent (Device/Increase time);To chair/3-in-1;With armrests Details for Transfer Assistance: cueing for posture and precautions     Shoulder Instructions     Exercise     Balance     End of Session OT - End of Session Equipment Utilized During Treatment: Gait  belt Activity Tolerance: Patient tolerated treatment well Patient left: in chair;with call bell/phone within reach Nurse Communication: Mobility status  GO     Anglea Gordner 05/18/2012, 1:10 PM

## 2012-05-18 NOTE — Discharge Summary (Signed)
  Physician Discharge Summary  Patient ID: Leslie [name] MRN: [id number] DOB/AGE: 1930/08/26 78 y.o.  Admit date: 05/17/2012 Discharge date: 05/18/2012  Admission Diagnoses: L3-4 and L4-5 spinal stenosis, L4-5 synovial cyst, lumbago, lumbar radiculopathy, neurogenic claudication  Discharge Diagnoses: The same Active Problems:  * No active hospital problems. *    Discharged Condition: good  Hospital Course: I admitted the patient to [facility] on 05/17/2012. On that day I performed a L4 laminectomy with bilateral L3 laminotomies with removal of L4-5 synovial cyst. The surgery went well.  The patient's postoperative course was unremarkable. The patient was given oral and written discharge instructions. All her questions were answered.  Consults: None Significant Diagnostic Studies: None Treatments: L4 laminectomy with bilateral L3 laminotomies and removal of L4-5 synovial cyst using microdissection. Discharge Exam: Blood pressure 122/43, pulse 58, temperature 98.1 F (36.7 C), temperature source Oral, resp. rate 16, height 5\' 6"  (1.676 m), weight 71.1 kg (156 lb 12 oz), SpO2 99.00%. The patient is alert and oriented. Her strength is normal. Her dressing is clean and dry. She looks well.  Disposition: Home     Medication List     As of 05/18/2012  7:57 AM    STOP taking these medications         acetaminophen 325 MG tablet   Commonly known as: TYLENOL      LORazepam 1 MG tablet   Commonly known as: ATIVAN      TAKE these medications         CALCIUM 1200 PO   Take 1 tablet by mouth daily.      cyanocobalamin 1000 MCG/ML injection   Commonly known as: (VITAMIN B-12)   Inject 1,000 mcg into the muscle every 30 (thirty) days.      diazepam 2 MG tablet   Commonly known as: VALIUM   Take 1 tablet (2 mg total) by mouth every 6 (six) hours as needed.      DSS 100 MG Caps   Take 100 mg by mouth 2 (two) times daily.      escitalopram 10 MG tablet   Commonly  known as: LEXAPRO   Take 10 mg by mouth daily.      furosemide 20 MG tablet   Commonly known as: LASIX   Take 20 mg by mouth every morning.      levothyroxine 100 MCG tablet   Commonly known as: SYNTHROID, LEVOTHROID   Take 100 mcg by mouth every morning.      metoprolol succinate 50 MG 24 hr tablet   Commonly known as: TOPROL-XL   Take 50 mg by mouth every morning. Take with or immediately following a meal.      oxyCODONE-acetaminophen 5-325 MG per tablet   Commonly known as: PERCOCET/ROXICET   Take 1-2 tablets by mouth every 4 (four) hours as needed.      quinapril 40 MG tablet   Commonly known as: ACCUPRIL   Take 40 mg by mouth at bedtime.      RABEprazole 20 MG tablet   Commonly known as: ACIPHEX   Take 20 mg by mouth daily as needed. For indigestion         Signed: Cristi Loron 05/18/2012, 7:57 AM

## 2012-05-18 NOTE — Care Management Note (Signed)
    Page 1 of 2   05/18/2012     2:06:34 PM   CARE MANAGEMENT NOTE 05/18/2012  Patient:  Leslie [name], Leslie [name]   Account Number:  [id number]  Date Initiated:  05/18/2012  Documentation initiated by:  Anette Guarneri  Subjective/Objective Assessment:   POD#1 s/p  L4 laminectomy with bilateral L3 laminotomies     Action/Plan:   home with [facility] services   Anticipated DC Date:  05/18/2012   Anticipated DC Plan:  HOME W HOME HEALTH SERVICES      DC Planning Services  CM consult      [facility] Choice  DURABLE MEDICAL EQUIPMENT  HOME HEALTH   Choice offered to / List presented to:  C-1 Patient   DME arranged  WALKER - ROLLING  SHOWER STOOL      DME agency  [facility]     HH arranged  HH-2 PT  HH-4 NURSE'S AIDE      HH agency  [facility]   Status of service:  Completed, signed off Medicare Important Message given?   (If response is "NO", the following Medicare IM given date fields will be blank) Date Medicare IM given:   Date Additional Medicare IM given:    Discharge Disposition:  HOME W HOME HEALTH SERVICES  Per UR Regulation:  Reviewed for med. necessity/level of care/duration of stay  If discussed at Long Length of Stay Meetings, dates discussed:    Comments:  05/18/12  0900  Anette Guarneri RN/CM HHPT/HHA/RW/shower chair ordered, spoke with patient regarding d/c needs. Per patient her husband has used [facility] in past and this is her choice for [facility] services Contacted [facility], arranged for HHPT/HHA to be started after discharge RW/shower chair to be delivered to room prior to discharge.

## 2012-05-19 ENCOUNTER — Encounter (HOSPITAL_COMMUNITY): Payer: Self-pay | Admitting: Neurosurgery

## 2012-05-23 ENCOUNTER — Observation Stay (HOSPITAL_COMMUNITY)
Admission: EM | Admit: 2012-05-23 | Discharge: 2012-05-24 | Disposition: A | Discharge status: Home / Self Care | Payer: Medicare Other | Source: Home / Self Care | Attending: Emergency Medicine | Admitting: Emergency Medicine

## 2012-05-23 ENCOUNTER — Encounter (HOSPITAL_COMMUNITY): Payer: Self-pay | Admitting: *Deleted

## 2012-05-23 ENCOUNTER — Emergency Department (HOSPITAL_COMMUNITY): Payer: Medicare Other

## 2012-05-23 DIAGNOSIS — E876 Hypokalemia: Secondary | ICD-10-CM | POA: Diagnosis present

## 2012-05-23 DIAGNOSIS — H538 Other visual disturbances: Secondary | ICD-10-CM | POA: Diagnosis present

## 2012-05-23 DIAGNOSIS — R112 Nausea with vomiting, unspecified: Secondary | ICD-10-CM | POA: Diagnosis present

## 2012-05-23 DIAGNOSIS — K5903 Drug induced constipation: Secondary | ICD-10-CM

## 2012-05-23 DIAGNOSIS — K219 Gastro-esophageal reflux disease without esophagitis: Secondary | ICD-10-CM | POA: Diagnosis present

## 2012-05-23 DIAGNOSIS — I509 Heart failure, unspecified: Secondary | ICD-10-CM | POA: Diagnosis present

## 2012-05-23 DIAGNOSIS — I69998 Other sequelae following unspecified cerebrovascular disease: Secondary | ICD-10-CM

## 2012-05-23 DIAGNOSIS — I10 Essential (primary) hypertension: Secondary | ICD-10-CM | POA: Diagnosis present

## 2012-05-23 DIAGNOSIS — I5042 Chronic combined systolic (congestive) and diastolic (congestive) heart failure: Secondary | ICD-10-CM | POA: Diagnosis present

## 2012-05-23 DIAGNOSIS — Z9889 Other specified postprocedural states: Secondary | ICD-10-CM

## 2012-05-23 DIAGNOSIS — R110 Nausea: Secondary | ICD-10-CM

## 2012-05-23 DIAGNOSIS — E039 Hypothyroidism, unspecified: Secondary | ICD-10-CM | POA: Diagnosis present

## 2012-05-23 DIAGNOSIS — D72829 Elevated white blood cell count, unspecified: Secondary | ICD-10-CM | POA: Diagnosis present

## 2012-05-23 DIAGNOSIS — K560 Paralytic ileus: Principal | ICD-10-CM | POA: Diagnosis present

## 2012-05-23 LAB — CBC WITH DIFFERENTIAL/PLATELET
Basophils Absolute: 0 10*3/uL (ref 0.0–0.1)
HCT: 37.1 % (ref 36.0–46.0)
Lymphocytes Relative: 8 % — ABNORMAL LOW (ref 12–46)
Lymphs Abs: 0.9 10*3/uL (ref 0.7–4.0)
MCV: 87.1 fL (ref 78.0–100.0)
Monocytes Absolute: 0.6 10*3/uL (ref 0.1–1.0)
Neutro Abs: 10.1 10*3/uL — ABNORMAL HIGH (ref 1.7–7.7)
Platelets: 253 10*3/uL (ref 150–400)
RBC: 4.26 MIL/uL (ref 3.87–5.11)
RDW: 13.4 % (ref 11.5–15.5)
WBC: 11.5 10*3/uL — ABNORMAL HIGH (ref 4.0–10.5)

## 2012-05-23 MED ORDER — FENTANYL CITRATE 0.05 MG/ML IJ SOLN
25.0000 ug | Freq: Once | INTRAMUSCULAR | Status: AC
  Administered 2012-05-24: 25 ug via INTRAVENOUS
  Filled 2012-05-23: qty 2

## 2012-05-23 MED ORDER — ONDANSETRON HCL 4 MG/2ML IJ SOLN
4.0000 mg | Freq: Once | INTRAMUSCULAR | Status: AC
  Administered 2012-05-23: 4 mg via INTRAVENOUS
  Filled 2012-05-23: qty 2

## 2012-05-23 MED ORDER — MILK AND MOLASSES ENEMA
Freq: Once | RECTAL | Status: DC
  Filled 2012-05-23: qty 250

## 2012-05-23 MED ORDER — ONDANSETRON 4 MG PO TBDP: 8.0000 mg | ORAL_TABLET | Freq: Once | ORAL | Status: DC

## 2012-05-23 NOTE — ED Provider Notes (Signed)
History     CSN: [id number]  Arrival date & time 05/23/12  2205   First MD Initiated Contact with Patient 05/23/12 2303      Chief Complaint  Patient presents with  . Abdominal Pain  . Constipation    1 week HX since last BM    (Consider location/radiation/quality/duration/timing/severity/associated sxs/prior treatment) HPI 78 year old female presents to emergency room complaining of one week of constipation. Patient had back surgery done on 1010, has been on Percocet since that time. Patient reports acute onset of lower abdominal pain around 3 PM. She took glycerin suppository around 3:30 without having a bowel movement, contacted the on-call surgeon around 7 PM who recommended mag citrate. She took this and another glycerin suppository. She has had a small amount of stool, but is still having pain and pressure in her lower abdomen. She denies any fever or chills. She has had nausea. She's been taking docusate sodium since the surgery without help with her constipation. She reports remote history of hiatal hernia surgery. No prior history of bowel obstruction. No history of diverticulitis.  Past Medical History  Diagnosis Date  . Hypertension   . Heart failure, left-sided   . MI (myocardial infarction)   . Bundle branch block left   . Stroke     blurred vision residual  . Shortness of breath     with exertion  . GERD (gastroesophageal reflux disease)     tums  . Arthritis   . Anemia   . Depression   . CHF (congestive heart failure)     chronic combine CHF  . Nonischemic cardiomyopathy     '01 EF 30% with normal coronaries; EF 45-50% 03/2012    Past Surgical History  Procedure Date  . Hernia repair     hiatial  . Eye surgery     bilateral cataracts  . Detached retinia   . Tonsillectomy   . Cardiac catheterization     13 yrs. ago  . Lumbar laminectomy/decompression microdiscectomy 05/17/2012    Procedure: LUMBAR LAMINECTOMY/DECOMPRESSION MICRODISCECTOMY 2 LEVELS;   Surgeon: Cristi Loron, MD;  Location: [facility];  Service: Neurosurgery;  Laterality: N/A;  Lumbar four laminectomy with bilateral Lumbar three laminotomies and removal of synovial cyst    No family history on file.  History  Substance Use Topics  . Smoking status: Never Smoker   . Smokeless tobacco: Not on file  . Alcohol Use: No    OB History    Grav Para Term Preterm Abortions TAB SAB Ect Mult Living                  Review of Systems  All other systems reviewed and are negative.    Allergies  Dilaudid; Sulfa antibiotics; and Xopenex  Home Medications   Current Outpatient Rx  Name Route Sig Dispense Refill  . CALCIUM 1200 PO Oral Take 1 tablet by mouth daily.    . CYANOCOBALAMIN 1000 MCG/ML IJ SOLN Intramuscular Inject 1,000 mcg into the muscle every 30 (thirty) days.    Marland Kitchen DIAZEPAM 2 MG PO TABS Oral Take 1 tablet (2 mg total) by mouth every 6 (six) hours as needed. 50 tablet 1  . DSS 100 MG PO CAPS Oral Take 100 mg by mouth 2 (two) times daily. 60 capsule 1  . ESCITALOPRAM OXALATE 10 MG PO TABS Oral Take 10 mg by mouth daily.    . FUROSEMIDE 20 MG PO TABS Oral Take 20 mg by mouth every morning.    Marland Kitchen  LEVOTHYROXINE SODIUM 100 MCG PO TABS Oral Take 100 mcg by mouth every morning.    Marland Kitchen METOPROLOL SUCCINATE ER 50 MG PO TB24 Oral Take 50 mg by mouth every morning. Take with or immediately following a meal.    . OXYCODONE-ACETAMINOPHEN 5-325 MG PO TABS Oral Take 1-2 tablets by mouth every 4 (four) hours as needed. 100 tablet 0  . QUINAPRIL HCL 40 MG PO TABS Oral Take 40 mg by mouth at bedtime.    Marland Kitchen RABEPRAZOLE SODIUM 20 MG PO TBEC Oral Take 20 mg by mouth daily as needed. For indigestion      BP 173/94  Pulse 79  Temp 98.4 F (36.9 C) (Oral)  Resp 18  SpO2 100%  Physical Exam  Nursing note and vitals reviewed. Constitutional: She appears well-developed and well-nourished. She appears distressed (uncomfortable-appearing).  HENT:  Head: Normocephalic and  atraumatic.  Nose: Nose normal.  Mouth/Throat: Oropharynx is clear and moist. No oropharyngeal exudate.  Neck: Normal range of motion. Neck supple. No JVD present. No tracheal deviation present. No thyromegaly present.  Cardiovascular: Normal rate, regular rhythm, normal heart sounds and intact distal pulses.  Exam reveals no gallop and no friction rub.   No murmur heard. Pulmonary/Chest: Effort normal and breath sounds normal. No stridor. No respiratory distress. She has no wheezes. She has no rales. She exhibits no tenderness.  Abdominal: Soft. Bowel sounds are normal. She exhibits distension. She exhibits no mass. There is tenderness. There is no rebound and no guarding.  Genitourinary:       Large amount of soft stool in rectum, unable to disimpact due to patient's comfort  Musculoskeletal: Normal range of motion. She exhibits no edema and no tenderness.  Lymphadenopathy:    She has no cervical adenopathy.  Neurological: She exhibits normal muscle tone. Coordination normal.  Skin: Skin is warm and dry. No rash noted. She is not diaphoretic. No erythema. No pallor.    ED Course  Procedures (including critical care time)  Labs Reviewed  CBC WITH DIFFERENTIAL - Abnormal; Notable for the following:    WBC 11.5 (*)     Neutrophils Relative 87 (*)     Neutro Abs 10.1 (*)     Lymphocytes Relative 8 (*)     All other components within normal limits  COMPREHENSIVE METABOLIC PANEL - Abnormal; Notable for the following:    Glucose, Bld 168 (*)     GFR calc non Af Amer 59 (*)     GFR calc Af Amer 69 (*)     All other components within normal limits  URINALYSIS, ROUTINE W REFLEX MICROSCOPIC - Abnormal; Notable for the following:    APPearance CLOUDY (*)     pH 8.5 (*)     Hgb urine dipstick TRACE (*)     Ketones, ur 40 (*)     Leukocytes, UA SMALL (*)     All other components within normal limits  URINE MICROSCOPIC-ADD ON - Abnormal; Notable for the following:    Squamous Epithelial /  LPF FEW (*)     Bacteria, UA FEW (*)     Casts HYALINE CASTS (*)     All other components within normal limits   Dg Abd 1 View  05/23/2012  *RADIOLOGY REPORT*  Clinical Data: Constipation and vomiting.  Abdominal pain.  1 week post lumbar surgery.  ABDOMEN - 1 VIEW  Comparison: Upper GI 09/10/2009  Findings: Scattered gas and stool in the colon.  Stool filled rectum.  No small  or large bowel distension.  No radiopaque stones. Calcified phlebolith in the pelvis.  Surgical clip in the left upper quadrant.  Interval L4 laminectomy changes.  IMPRESSION: Nonobstructive bowel gas pattern with stool filled colon.   Original Report Authenticated By: Marlon Pel, M.D.      1. Constipation due to pain medication   2. Nausea       MDM  78 year old female with constipation. Patient has soft stool in the vault, did not tolerate disimpaction. We'll try a milk of molasses enema, and reassess. I have low tolerance to do a CT scan if she does not improve with the enema.   5:00 AM She was to be admitted, but has not passed her by mouth challenge. Will move to CDU on dehydration protocol. We'll dose with MiraLAX, leave n.p.o. until she is feeling better.       Olivia Mackie, MD 05/24/12 0500

## 2012-05-23 NOTE — ED Notes (Signed)
PT arrives tonight with a Hx of constipation x one week. Pt had back surgery on 10/10 . Pt has been taking Norco 10/325 for pain. To night Pt reports taking 3 glycerine supp. And 10 oz of Mag. Citrate. No results . Pt now reports ABD pain.

## 2012-05-24 LAB — COMPREHENSIVE METABOLIC PANEL
ALT: 12 U/L (ref 0–35)
AST: 17 U/L (ref 0–37)
CO2: 25 mEq/L (ref 19–32)
Chloride: 103 mEq/L (ref 96–112)
GFR calc Af Amer: 69 mL/min — ABNORMAL LOW (ref >90)
GFR calc non Af Amer: 59 mL/min — ABNORMAL LOW (ref >90)
Glucose, Bld: 168 mg/dL — ABNORMAL HIGH (ref 70–99)
Sodium: 142 mEq/L (ref 135–145)
Total Bilirubin: 0.6 mg/dL (ref 0.3–1.2)

## 2012-05-24 LAB — URINALYSIS, ROUTINE W REFLEX MICROSCOPIC
Bilirubin Urine: NEGATIVE
Ketones, ur: 40 mg/dL — AB
Protein, ur: NEGATIVE mg/dL
Urobilinogen, UA: 0.2 mg/dL (ref 0.0–1.0)

## 2012-05-24 LAB — URINE MICROSCOPIC-ADD ON

## 2012-05-24 MED ORDER — ONDANSETRON HCL 4 MG/2ML IJ SOLN: 4.0000 mg | Freq: Four times a day (QID) | INTRAMUSCULAR | Status: DC | PRN

## 2012-05-24 MED ORDER — ONDANSETRON HCL 4 MG/2ML IJ SOLN
4.0000 mg | Freq: Once | INTRAMUSCULAR | Status: AC
  Administered 2012-05-24: 4 mg via INTRAVENOUS
  Filled 2012-05-24: qty 2

## 2012-05-24 MED ORDER — SODIUM CHLORIDE 0.9 % IV SOLN
1000.0000 mL | INTRAVENOUS | Status: DC
  Administered 2012-05-24: 1000 mL via INTRAVENOUS

## 2012-05-24 MED ORDER — POLYETHYLENE GLYCOL 3350 17 G PO PACK
17.0000 g | PACK | Freq: Every day | ORAL | Status: DC
  Filled 2012-05-24: qty 1

## 2012-05-24 MED ORDER — POLYETHYLENE GLYCOL 3350 17 G PO PACK: 17.0000 g | PACK | Freq: Two times a day (BID) | ORAL | Status: DC

## 2012-05-24 MED ORDER — LORAZEPAM 2 MG/ML IJ SOLN
1.0000 mg | Freq: Once | INTRAMUSCULAR | Status: AC
  Administered 2012-05-24: 1 mg via INTRAVENOUS
  Filled 2012-05-24: qty 1

## 2012-05-24 MED ORDER — SODIUM CHLORIDE 0.9 % IV SOLN
1000.0000 mL | Freq: Once | INTRAVENOUS | Status: AC
  Administered 2012-05-24: 1000 mL via INTRAVENOUS

## 2012-05-24 MED ORDER — ONDANSETRON HCL 4 MG PO TABS: 4.0000 mg | ORAL_TABLET | Freq: Four times a day (QID) | ORAL | Status: DC

## 2012-05-24 NOTE — ED Provider Notes (Signed)
Medical screening examination/treatment/procedure(s) were performed by non-physician practitioner and as supervising physician I was immediately available for consultation/collaboration.  Ethelda Chick, MD 05/24/12 [phone]

## 2012-05-24 NOTE — ED Notes (Signed)
Pt and family concerned that pt had allergic reaction to the zofran she received.  States the patient had not vomited prior to zofran and has been throwing up since.

## 2012-05-24 NOTE — ED Provider Notes (Signed)
Leslie [name] is a 78 y.o. female in CDU from pod a for hydration. Sign out from Dr. Norlene Campbell as follows: Patient had 7 days of constipation, she was manually disimpacted and has had multiple bowel movements since that time. Patient was scheduled for discharge but had a single episode of vomiting. She appears to be nervous about discharge to home. She is sent to the CDU for hydration, reassurance and serial exams.  As per RN request, 1 mg of Ativan is given through the IV secondary to agitation. Blood pressure is elevated.  9:15 AM. Patient seen and examined resting comfortably at the bedside. She denies any pain at this point, any vomiting or nausea, she states that she feels comfortable going home at this time. Lung sounds are clear to auscultation bilaterally, heart is regular rate and rhythm with no murmur is rubs or gallops. The abdominal exam is benign with no peritoneal signs and tenderness to palpation.   Pt verbalized understanding and agrees with care plan. Outpatient follow-up and return precautions given.     Wynetta Emery, PA-C 05/24/12 [phone]

## 2012-05-24 NOTE — ED Notes (Signed)
Family at bedside. 

## 2012-05-24 NOTE — ED Notes (Signed)
Pt sitting in bed talking to family member.  Pt alert oriented X4

## 2012-05-24 NOTE — ED Notes (Signed)
O2 83% on RA, placed on 3 L O2NC.  Sats now 92%.

## 2012-05-25 ENCOUNTER — Emergency Department (HOSPITAL_COMMUNITY): Payer: Medicare Other

## 2012-05-25 ENCOUNTER — Encounter (HOSPITAL_COMMUNITY): Payer: Self-pay | Admitting: Emergency Medicine

## 2012-05-25 ENCOUNTER — Inpatient Hospital Stay (HOSPITAL_COMMUNITY)
Admission: EM | Admit: 2012-05-25 | Discharge: 2012-05-29 | DRG: 389 | Disposition: A | Discharge status: Home Health | Payer: Medicare Other | Attending: Gastroenterology | Admitting: Gastroenterology

## 2012-05-25 DIAGNOSIS — E039 Hypothyroidism, unspecified: Secondary | ICD-10-CM | POA: Diagnosis present

## 2012-05-25 DIAGNOSIS — K9189 Other postprocedural complications and disorders of digestive system: Secondary | ICD-10-CM

## 2012-05-25 DIAGNOSIS — D72829 Elevated white blood cell count, unspecified: Secondary | ICD-10-CM | POA: Diagnosis present

## 2012-05-25 DIAGNOSIS — R112 Nausea with vomiting, unspecified: Secondary | ICD-10-CM

## 2012-05-25 DIAGNOSIS — I10 Essential (primary) hypertension: Secondary | ICD-10-CM | POA: Diagnosis present

## 2012-05-25 DIAGNOSIS — K567 Ileus, unspecified: Secondary | ICD-10-CM | POA: Diagnosis present

## 2012-05-25 DIAGNOSIS — K5900 Constipation, unspecified: Secondary | ICD-10-CM

## 2012-05-25 LAB — CBC
HCT: 33.8 % — ABNORMAL LOW (ref 36.0–46.0)
Hemoglobin: 11.2 g/dL — ABNORMAL LOW (ref 12.0–15.0)
MCH: 28.9 pg (ref 26.0–34.0)
MCHC: 33.1 g/dL (ref 30.0–36.0)
Platelets: 240 10*3/uL (ref 150–400)
RBC: 3.84 MIL/uL — ABNORMAL LOW (ref 3.87–5.11)
RDW: 13.8 % (ref 11.5–15.5)
WBC: 12 10*3/uL — ABNORMAL HIGH (ref 4.0–10.5)

## 2012-05-25 LAB — BASIC METABOLIC PANEL
CO2: 26 mEq/L (ref 19–32)
Calcium: 8.7 mg/dL (ref 8.4–10.5)
Chloride: 97 mEq/L (ref 96–112)
GFR calc Af Amer: >90 mL/min (ref >90)
Sodium: 134 mEq/L — ABNORMAL LOW (ref 135–145)

## 2012-05-25 LAB — POCT I-STAT, CHEM 8
Chloride: 102 mEq/L (ref 96–112)
Creatinine, Ser: 0.9 mg/dL (ref 0.50–1.10)
Glucose, Bld: 130 mg/dL — ABNORMAL HIGH (ref 70–99)
Hemoglobin: 11.2 g/dL — ABNORMAL LOW (ref 12.0–15.0)
Potassium: 3.5 mEq/L (ref 3.5–5.1)

## 2012-05-25 LAB — URINALYSIS, ROUTINE W REFLEX MICROSCOPIC
Nitrite: NEGATIVE
Protein, ur: 100 mg/dL — AB
Urobilinogen, UA: 0.2 mg/dL (ref 0.0–1.0)

## 2012-05-25 LAB — URINE MICROSCOPIC-ADD ON

## 2012-05-25 MED ORDER — POTASSIUM CHLORIDE IN NACL 20-0.9 MEQ/L-% IV SOLN
INTRAVENOUS | Status: DC
  Administered 2012-05-25 – 2012-05-28 (×7): via INTRAVENOUS
  Filled 2012-05-25 (×8): qty 1000

## 2012-05-25 MED ORDER — LEVOTHYROXINE SODIUM 100 MCG IV SOLR
50.0000 ug | Freq: Every day | INTRAVENOUS | Status: DC
  Administered 2012-05-26 – 2012-05-28 (×3): 50 ug via INTRAVENOUS
  Filled 2012-05-25 (×4): qty 2.5

## 2012-05-25 MED ORDER — PANTOPRAZOLE SODIUM 40 MG IV SOLR
40.0000 mg | INTRAVENOUS | Status: DC
  Administered 2012-05-25 – 2012-05-28 (×4): 40 mg via INTRAVENOUS
  Filled 2012-05-25 (×5): qty 40

## 2012-05-25 MED ORDER — ENOXAPARIN SODIUM 40 MG/0.4ML ~~LOC~~ SOLN
40.0000 mg | SUBCUTANEOUS | Status: DC
  Administered 2012-05-25 – 2012-05-28 (×4): 40 mg via SUBCUTANEOUS
  Filled 2012-05-25 (×5): qty 0.4

## 2012-05-25 MED ORDER — FENTANYL CITRATE 0.05 MG/ML IJ SOLN
50.0000 ug | Freq: Once | INTRAMUSCULAR | Status: AC
  Administered 2012-05-25: 50 ug via INTRAVENOUS
  Filled 2012-05-25: qty 2

## 2012-05-25 MED ORDER — POTASSIUM CHLORIDE 10 MEQ/100ML IV SOLN
10.0000 meq | INTRAVENOUS | Status: AC
  Administered 2012-05-25 (×3): 10 meq via INTRAVENOUS
  Filled 2012-05-25 (×3): qty 100

## 2012-05-25 MED ORDER — METOCLOPRAMIDE HCL 5 MG/ML IJ SOLN
5.0000 mg | Freq: Once | INTRAMUSCULAR | Status: AC
  Administered 2012-05-25: 5 mg via INTRAVENOUS
  Filled 2012-05-25: qty 2

## 2012-05-25 MED ORDER — METOPROLOL TARTRATE 1 MG/ML IV SOLN
5.0000 mg | Freq: Three times a day (TID) | INTRAVENOUS | Status: DC
  Administered 2012-05-25 – 2012-05-28 (×8): 5 mg via INTRAVENOUS
  Filled 2012-05-25 (×11): qty 5

## 2012-05-25 MED ORDER — SODIUM CHLORIDE 0.45 % IV SOLN: INTRAVENOUS | Status: DC

## 2012-05-25 MED ORDER — SODIUM CHLORIDE 0.9 % IV BOLUS (SEPSIS)
250.0000 mL | Freq: Once | INTRAVENOUS | Status: AC
  Administered 2012-05-25: 250 mL via INTRAVENOUS

## 2012-05-25 NOTE — ED Notes (Signed)
Per PT's son pt had a back sx on 10/09, became constipated and on Tuesday went [facility] where they tried enema, and then manual disimpaction. Pt was d/c'd home after that but has not being able to eat or drink anything due to constant nausea and vomiting. Pt sts she can only drink little bit of water without throwing up. Pt is c/o abdominal pain. Abdomen is soft and tender on palpation.

## 2012-05-25 NOTE — ED Notes (Signed)
Off floor for testing 

## 2012-05-25 NOTE — H&P (Signed)
Hospital Admission Note Date: 05/25/2012  Patient name: Leslie [name] Medical record number: [id number] Date of birth: 1931/01/19 Age: 78 y.o. Gender: female PCP: Charolett Bumpers, MD  Attending physician: Charolett Bumpers, MD  Chief Complaint:Nausea and Vomiting x 2 days.  History of Present Illness: This is a 78 year old female who recently had a laminectomy on 05/17/2012. The patient went home and was doing well until 2 days ago when she started having nausea and vomiting. The patient is seen in the emergency room where she was found to have fecal impaction and had a disimpaction. She was sent home on yesterday but continued to have nausea and vomiting. Return to emergency room today and during her examination was found to have an ileus. Please note the patient had been taking narcotic pain medications for approximately 4 days postop. A bowel regimen during this time her stool softeners.  The patient denies any fever, chills, diarrhea, chest pain, dizziness, shortness of breath, urinary frequency or urgency. She does admit to abdominal bloating but no significant abdominal pain. As it relates to her back the patient states at present she is pain-free from her back surgery. Please note the patient was receiving visible therapy at home via [facility].  Scheduled Meds:   . fentaNYL  50 mcg Intravenous Once  . metoCLOPramide (REGLAN) injection  5 mg Intravenous Once   Continuous Infusions:   . sodium chloride Stopped (05/25/12 1624)   PRN Meds:. Allergies: Dilaudid; Sulfa antibiotics; Xopenex; and Zofran Past Medical History  Diagnosis Date  . Hypertension   . Heart failure, left-sided   . MI (myocardial infarction)   . Bundle branch block left   . Stroke     blurred vision residual  . Shortness of breath     with exertion  . GERD (gastroesophageal reflux disease)     tums  . Arthritis   . Anemia   . Depression   . CHF (congestive heart failure)     chronic combine  CHF  . Nonischemic cardiomyopathy     '01 EF 30% with normal coronaries; EF 45-50% 03/2012   Past Surgical History  Procedure Date  . Hernia repair     hiatial  . Eye surgery     bilateral cataracts  . Detached retinia   . Tonsillectomy   . Cardiac catheterization     13 yrs. ago  . Lumbar laminectomy/decompression microdiscectomy 05/17/2012    Procedure: LUMBAR LAMINECTOMY/DECOMPRESSION MICRODISCECTOMY 2 LEVELS;  Surgeon: Cristi Loron, MD;  Location: [facility];  Service: Neurosurgery;  Laterality: N/A;  Lumbar four laminectomy with bilateral Lumbar three laminotomies and removal of synovial cyst   History reviewed. No pertinent family history. History   Social History  . Marital Status: Widowed    Spouse Name: N/A    Number of Children: N/A  . Years of Education: N/A   Occupational History  . Not on file.   Social History Main Topics  . Smoking status: Never Smoker   . Smokeless tobacco: Never Used  . Alcohol Use: No  . Drug Use: No  . Sexually Active:    Other Topics Concern  . Not on file   Social History Narrative  . No narrative on file   Family history: The patient denies any history of significant disease in her first-degree relatives.  Review of Systems: A comprehensive review of systems was negative except as noted in the history of present illness.   Physical Exam: No intake or output data  in the 24 hours ending 05/25/12 1940 General: Alert, awake, oriented x3, in no acute distress.  HEENT: [place]/AT PEERL, EOMI Neck: Trachea midline,  no masses, no thyromegal,y no JVD, no carotid bruit OROPHARYNX:  Moist, No exudate/ erythema/lesions.  Heart: Regular rate and rhythm, without murmurs, rubs, gallops, PMI non-displaced, no heaves or thrills on palpation.  Lungs: Clear to auscultation. Abdomen: Soft, nontender, mildly distended, tympanitic but positive bowel sounds, no masses no hepatosplenomegaly noted.  Neuro: No focal neurological deficits noted  cranial nerves II through XII grossly intact. Musculoskeletal: No warm swelling or erythema around joints, no spinal tenderness noted.    Lab results:  [facility] 05/25/12 1646 05/23/12 2341  NA 137 142  K 3.5 3.6  CL 102 103  CO2 -- 25  GLUCOSE 130* 168*  BUN 17 15  CREATININE 0.90 0.90  CALCIUM -- 9.6  MG -- --  PHOS -- --    Basename 05/23/12 2341  AST 17  ALT 12  ALKPHOS 90  BILITOT 0.6  PROT 6.9  ALBUMIN 3.5   No results found for this basename: LIPASE:2,AMYLASE:2 in the last 72 hours  Basename 05/25/12 1646 05/25/12 1642 05/23/12 2341  WBC -- 13.2* 11.5*  NEUTROABS -- -- 10.1*  HGB 11.2* 11.2* --  HCT 33.0* 33.8* --  MCV -- 88.0 87.1  PLT -- 265 253   No results found for this basename: CKTOTAL:3,CKMB:3,CKMBINDEX:3,TROPONINI:3 in the last 72 hours No components found with this basename: POCBNP:3 No results found for this basename: DDIMER:2 in the last 72 hours No results found for this basename: HGBA1C:2 in the last 72 hours No results found for this basename: CHOL:2,HDL:2,LDLCALC:2,TRIG:2,CHOLHDL:2,LDLDIRECT:2 in the last 72 hours No results found for this basename: TSH,T4TOTAL,FREET3,T3FREE,THYROIDAB in the last 72 hours No results found for this basename: VITAMINB12:2,FOLATE:2,FERRITIN:2,TIBC:2,IRON:2,RETICCTPCT:2 in the last 72 hours Imaging results:  Dg Chest 2 View  05/10/2012  *RADIOLOGY REPORT*  Clinical Data: Preop.  Preadmission.  CHEST - 2 VIEW  Comparison: CT chest 06/20/2008 and chest radiograph 01/16/2008.  Findings: Heart size is within normal limits and stable. Mediastinal and hilar contours are stable.  The lungs normally expanded and clear.  The stomach is normally positioned beneath the left hemidiaphragm.  Bones appear osteopenic.  No acute bony abnormality identified.  IMPRESSION: No acute cardiopulmonary disease.   Original Report Authenticated By: Britta Mccreedy, M.D.    Dg Abd 1 View  05/23/2012  *RADIOLOGY REPORT*  Clinical Data:  Constipation and vomiting.  Abdominal pain.  1 week post lumbar surgery.  ABDOMEN - 1 VIEW  Comparison: Upper GI 09/10/2009  Findings: Scattered gas and stool in the colon.  Stool filled rectum.  No small or large bowel distension.  No radiopaque stones. Calcified phlebolith in the pelvis.  Surgical clip in the left upper quadrant.  Interval L4 laminectomy changes.  IMPRESSION: Nonobstructive bowel gas pattern with stool filled colon.   Original Report Authenticated By: Marlon Pel, M.D.    Dg Lumbar Spine 1 View  05/17/2012  *RADIOLOGY REPORT*  Clinical Data: Bilateral L3 laminectomies.  LUMBAR SPINE - 1 VIEW  Comparison: MRI 04/27/2011  Findings: Using the numbering convention of comparison MRI, there is a blunt tip probe positioned in just superior to the spinous process of the L5 vertebral body.  IMPRESSION: Single intraoperative view of lumbar spine as above.   Original Report Authenticated By: Genevive Bi, M.D.    Dg Abd Acute W/chest  05/25/2012  *RADIOLOGY REPORT*  Clinical Data: Nausea and vomiting.  Possible obstruction.  Constipation.  Severe abdominal pain.  ACUTE ABDOMEN SERIES (ABDOMEN 2 VIEW & CHEST 1 VIEW)  Comparison: Single view abdomen 05/23/2012.  Two-view chest 05/10/2012.  Findings: The heart size is exaggerated by low lung volumes.  No focal airspace disease is evident.  The mild gaseous distention of large and small bowel is new.  There are some fluid levels within the small bowel.  There is no obstruction.  No free air is present.  IMPRESSION:  1.  No acute cardiopulmonary disease. 2.  Increasing gaseous distention of large and small bowel with scattered fluid levels.  This is most compatible with a diffuse ileus.   Original Report Authenticated By: Jamesetta Orleans. MATTERN, M.D.    Other results:    Patient Active Hospital Problem List: No active hospital problems. #1 Ileus: The patient has abdominal x-ray which is consistent with an abdominal ileus. At present she  has an NG tube has been placed in the emergency room. I will continue that at low intermittent wall suction, the patient will receive supportive care with IV fluid and we will make sure that her electrolytes are corrected. The patient's clinical course we'll follow clinically with radiologic studies as indicated by clinical condition.  #2 Electrolyte imbalance. Patient is on the low end of normal for her potassium levels. We'll supplement potassium IV to get her potassium level greater than 4.  #3. History of hypothyroidism. The patient supplemented with Synthroid 100 mcg by mouth daily. I will continue replacement by IV with 50 mcg IV daily. Also check a TSH to ensure that this is not contributing to the ileus seen in this patient.  #4. History of hypertension: The patient has a history of hypertension and is on both beta blocker and ACE inhibitor. While the patient is an n.p.o. status she will have her beta blocker administered by IV equivalent on a scheduled basis with parameters in place.  #5. Leukocytosis: The patient has a mild leukocytosis with white blood cell count 13.2. This could certainly be related to the stress of the current condition compounded by recent fecal impaction. The patient shows no other signs of infection at this time I will hold any antibiotics and continue with watchful waiting.  #6. History of gastroesophageal reflux disease: The patient is on AcipHex at home. She will receive IV Protonix will hospitalize.  DVT prophylaxis: Lovenox.  Total time spent in the initial evaluation of this patient approximately 54 minutes.  Briele Lagasse A. 05/25/2012, 7:40 PM

## 2012-05-25 NOTE — ED Notes (Signed)
Called to report nurse unavailable will call back.

## 2012-05-25 NOTE — ED Notes (Signed)
Patient states she had back surgery on 10/9 and was constipated from taking her pain medicine.  Patient states she was disimpacted yesterday morning at [facility] ER, but she hasn't had a bowel movement since and began vomiting today.  Patient states she has been feeling warm and patient's family states she has been confused.

## 2012-05-25 NOTE — ED Notes (Signed)
This RN attempted to start an IV x2 unsuccessfully Will ask another RN to try.Marland Kitchen

## 2012-05-25 NOTE — Progress Notes (Signed)
Pt choice of home health care services is [facility] See list of agencies offered below    HOME HEALTH AGENCIES SERVING [place]   Agencies that are Medicare-Certified and are affiliated with [facility] Home Health Agency  Telephone Number Address  [facility]   The [facility] has ownership interest in this company; however, you are under no obligation to use this agency. [phone] or  [phone] [address] [place], [place] [address] [email or web address]   Agencies that are Medicare-Certified and are not affiliated with [facility] Agency Telephone Number Address  [facility] [phone] Fax [phone] [address] [place], [place]  [address] [email or web address]  [facility] [phone] or [phone] Fax [phone] [address] [place], [place] [address] [email or web address]  [facility] [phone] Fax [phone] [address] [place], [place] [address] [email or web address]  [facility] [phone] Fax [phone] [address] [place], [place]  [address] [email or web address]  [facility] [phone] Fax [phone] [address], Suite [place], [place] [address] [email or web address]  [facility] Services of [facility] [phone] [address] [place], [place] [address] [email or web address]  [facility] [phone]  [address] [place], [place] [address] [email or web address]  [facility] [phone] or [phone] Fax [phone] [address] [place], [place]  [address] [email or web address]  [facility] [phone] Fax [phone] [address] [place], [place]  [address]  [facility]  [phone] Fax [phone] [address] [place], [place] [address] [email or web address]

## 2012-05-25 NOTE — ED Provider Notes (Signed)
History     CSN: [id number]  Arrival date & time 05/25/12  1310   First MD Initiated Contact with Patient 05/25/12 1400      Chief Complaint  Patient presents with  . Emesis    (Consider location/radiation/quality/duration/timing/severity/associated sxs/prior treatment) HPI Comments: Patient had back surgery , October 9 she been taking Vicodin for pain.  She was seen yesterday in most common emergency department for constipation, where she spent hours in CDU for hydration, and she was manually disimpacted.  She had several episodes of vomiting.  At that time.  At the time of her discharge.  She was feeling, better, with no vomiting.  Family reports, that she's had persistent vomiting since his arrived home.  She has not filled a prescription for 2 blocks.  She does continue taking her pain medication.  Several days ago.  She has a prescription for Colace, but has been unable to tolerate the medication due to the vomiting  Patient is a 78 y.o. female presenting with vomiting. The history is provided by the patient.  Emesis  This is a recurrent problem. The current episode started yesterday. The problem occurs 2 to 4 times per day. Pertinent negatives include no abdominal pain, no chills and no fever.    Past Medical History  Diagnosis Date  . Hypertension   . Heart failure, left-sided   . MI (myocardial infarction)   . Bundle branch block left   . Stroke     blurred vision residual  . Shortness of breath     with exertion  . GERD (gastroesophageal reflux disease)     tums  . Arthritis   . Anemia   . Depression   . CHF (congestive heart failure)     chronic combine CHF  . Nonischemic cardiomyopathy     '01 EF 30% with normal coronaries; EF 45-50% 03/2012    Past Surgical History  Procedure Date  . Hernia repair     hiatial  . Eye surgery     bilateral cataracts  . Detached retinia   . Tonsillectomy   . Cardiac catheterization     13 yrs. ago  . Lumbar  laminectomy/decompression microdiscectomy 05/17/2012    Procedure: LUMBAR LAMINECTOMY/DECOMPRESSION MICRODISCECTOMY 2 LEVELS;  Surgeon: Cristi Loron, MD;  Location: [facility];  Service: Neurosurgery;  Laterality: N/A;  Lumbar four laminectomy with bilateral Lumbar three laminotomies and removal of synovial cyst    History reviewed. No pertinent family history.  History  Substance Use Topics  . Smoking status: Never Smoker   . Smokeless tobacco: Not on file  . Alcohol Use: No    OB History    Grav Para Term Preterm Abortions TAB SAB Ect Mult Living                  Review of Systems  Constitutional: Positive for appetite change. Negative for fever and chills.  Gastrointestinal: Positive for nausea, vomiting and constipation. Negative for abdominal pain.  Neurological: Negative for dizziness and weakness.    Allergies  Dilaudid; Sulfa antibiotics; Xopenex; and Zofran  Home Medications   Current Outpatient Rx  Name Route Sig Dispense Refill  . CALCIUM 1200 PO Oral Take 1 tablet by mouth daily.    . CYANOCOBALAMIN 1000 MCG/ML IJ SOLN Intramuscular Inject 1,000 mcg into the muscle every 30 (thirty) days.    Marland Kitchen DIAZEPAM 2 MG PO TABS Oral Take 2 mg by mouth every 6 (six) hours as needed. Muscle spasm    .  DSS 100 MG PO CAPS Oral Take 100 mg by mouth 2 (two) times daily.    Marland Kitchen ESCITALOPRAM OXALATE 10 MG PO TABS Oral Take 10 mg by mouth daily.    . FUROSEMIDE 20 MG PO TABS Oral Take 20 mg by mouth every morning.    Marland Kitchen LEVOTHYROXINE SODIUM 100 MCG PO TABS Oral Take 100 mcg by mouth every morning.    Marland Kitchen METOPROLOL SUCCINATE ER 50 MG PO TB24 Oral Take 50 mg by mouth every morning. Take with or immediately following a meal.    . ONDANSETRON HCL 4 MG PO TABS Oral Take 1 tablet (4 mg total) by mouth every 6 (six) hours. PRN nausea 12 tablet 0  . OXYCODONE-ACETAMINOPHEN 5-325 MG PO TABS Oral Take 1-2 tablets by mouth every 4 (four) hours as needed. 100 tablet 0  . QUINAPRIL HCL 40 MG PO  TABS Oral Take 40 mg by mouth daily.     Marland Kitchen RABEPRAZOLE SODIUM 20 MG PO TBEC Oral Take 20 mg by mouth daily as needed. For indigestion      BP 138/55  Pulse 66  Temp 98.5 F (36.9 C) (Oral)  Resp 20  Ht 5\' 7"  (1.702 m)  Wt 158 lb (71.668 kg)  BMI 24.75 kg/m2  SpO2 98%  Physical Exam  Constitutional: She appears well-developed and well-nourished.  HENT:  Head: Normocephalic.  Eyes: Pupils are equal, round, and reactive to light.  Neck: Normal range of motion.  Cardiovascular: Normal rate.   Pulmonary/Chest: Effort normal.  Abdominal: Soft. Bowel sounds are normal. She exhibits no distension. There is generalized tenderness.       Patient reports, abdominal wall is sore  Musculoskeletal: Normal range of motion.  Neurological: She is alert.  Skin: Skin is warm and dry.    ED Course  CRITICAL CARE Performed by: Arman Filter Authorized by: Arman Filter Total critical care time: 20 minutes Critical care was necessary to treat or prevent imminent or life-threatening deterioration of the following conditions: dehydration. Critical care was time spent personally by me on the following activities: evaluation of patient's response to treatment, examination of patient, obtaining history from patient or surrogate, ordering and review of laboratory studies, ordering and review of radiographic studies and re-evaluation of patient's condition.   (including critical care time)  Labs Reviewed  URINALYSIS, ROUTINE W REFLEX MICROSCOPIC - Abnormal; Notable for the following:    Hgb urine dipstick MODERATE (*)     Ketones, ur TRACE (*)     Protein, ur 100 (*)     All other components within normal limits  CBC - Abnormal; Notable for the following:    WBC 13.2 (*)     RBC 3.84 (*)     Hemoglobin 11.2 (*)     HCT 33.8 (*)     All other components within normal limits  POCT I-STAT, CHEM 8 - Abnormal; Notable for the following:    Glucose, Bld 130 (*)     Calcium, Ion 1.10 (*)      Hemoglobin 11.2 (*)     HCT 33.0 (*)     All other components within normal limits  URINE MICROSCOPIC-ADD ON   Dg Abd 1 View  05/23/2012  *RADIOLOGY REPORT*  Clinical Data: Constipation and vomiting.  Abdominal pain.  1 week post lumbar surgery.  ABDOMEN - 1 VIEW  Comparison: Upper GI 09/10/2009  Findings: Scattered gas and stool in the colon.  Stool filled rectum.  No small or large bowel  distension.  No radiopaque stones. Calcified phlebolith in the pelvis.  Surgical clip in the left upper quadrant.  Interval L4 laminectomy changes.  IMPRESSION: Nonobstructive bowel gas pattern with stool filled colon.   Original Report Authenticated By: Marlon Pel, M.D.    Dg Abd Acute W/chest  05/25/2012  *RADIOLOGY REPORT*  Clinical Data: Nausea and vomiting.  Possible obstruction. Constipation.  Severe abdominal pain.  ACUTE ABDOMEN SERIES (ABDOMEN 2 VIEW & CHEST 1 VIEW)  Comparison: Single view abdomen 05/23/2012.  Two-view chest 05/10/2012.  Findings: The heart size is exaggerated by low lung volumes.  No focal airspace disease is evident.  The mild gaseous distention of large and small bowel is new.  There are some fluid levels within the small bowel.  There is no obstruction.  No free air is present.  IMPRESSION:  1.  No acute cardiopulmonary disease. 2.  Increasing gaseous distention of large and small bowel with scattered fluid levels.  This is most compatible with a diffuse ileus.   Original Report Authenticated By: Jamesetta Orleans. MATTERN, M.D.      1. Ileus, postoperative   2. Constipated       MDM  Will reimage patient to rule out obstruction IV hydration, antiemetic  Reevaluated pateint pre and post NG tube apx 250 cc bilious fluid in canister feels better         Arman Filter, NP 05/25/12 1754  Arman Filter, NP 05/25/12 1851  Arman Filter, NP 05/25/12 1610

## 2012-05-26 ENCOUNTER — Inpatient Hospital Stay (HOSPITAL_COMMUNITY): Payer: Medicare Other

## 2012-05-26 LAB — BASIC METABOLIC PANEL
GFR calc non Af Amer: 78 mL/min — ABNORMAL LOW (ref >90)
Glucose, Bld: 132 mg/dL — ABNORMAL HIGH (ref 70–99)
Potassium: 4.1 mEq/L (ref 3.5–5.1)
Sodium: 135 mEq/L (ref 135–145)

## 2012-05-26 LAB — CBC WITH DIFFERENTIAL/PLATELET
Eosinophils Absolute: 0 10*3/uL (ref 0.0–0.7)
Hemoglobin: 11.4 g/dL — ABNORMAL LOW (ref 12.0–15.0)
Lymphocytes Relative: 7 % — ABNORMAL LOW (ref 12–46)
Lymphs Abs: 0.9 10*3/uL (ref 0.7–4.0)
MCH: 28.9 pg (ref 26.0–34.0)
Monocytes Relative: 8 % (ref 3–12)
Neutro Abs: 11.2 10*3/uL — ABNORMAL HIGH (ref 1.7–7.7)
Neutrophils Relative %: 85 % — ABNORMAL HIGH (ref 43–77)
RBC: 3.94 MIL/uL (ref 3.87–5.11)
WBC: 13.2 10*3/uL — ABNORMAL HIGH (ref 4.0–10.5)

## 2012-05-26 LAB — TSH: TSH: 0.088 u[IU]/mL — ABNORMAL LOW (ref 0.350–4.500)

## 2012-05-26 NOTE — Progress Notes (Signed)
Problem: ileus. The patient reports no pain, nausea, or vomiting on NG suction. I recommend removing the NG tube tomorrow morning and starting clear liquids. She can also restart her chronic oral medications tomorrow. She not receive narcotics.

## 2012-05-26 NOTE — Progress Notes (Signed)
INITIAL ADULT NUTRITION ASSESSMENT Date: 05/26/2012   Time: 10:40 AM Reason for Assessment: Nutrition Risk   ASSESSMENT: Female 78 y.o.  Dx: nausea and vomiting x 2 days   Hx:  Past Medical History  Diagnosis Date  . Hypertension   . Heart failure, left-sided   . MI (myocardial infarction)   . Bundle branch block left   . Stroke     blurred vision residual  . Shortness of breath     with exertion  . GERD (gastroesophageal reflux disease)     tums  . Arthritis   . Anemia   . Depression   . CHF (congestive heart failure)     chronic combine CHF  . Nonischemic cardiomyopathy     '01 EF 30% with normal coronaries; EF 45-50% 03/2012    Related Meds:  Scheduled Meds:   . enoxaparin (LOVENOX) injection  40 mg Subcutaneous Q24H  . fentaNYL  50 mcg Intravenous Once  . levothyroxine  50 mcg Intravenous Q breakfast  . metoCLOPramide (REGLAN) injection  5 mg Intravenous Once  . metoprolol  5 mg Intravenous Q8H  . pantoprazole (PROTONIX) IV  40 mg Intravenous Q24H  . potassium chloride  10 mEq Intravenous Q1 Hr x 3  . sodium chloride  250 mL Intravenous Once  . DISCONTD: sodium chloride   Intravenous STAT   Continuous Infusions:   . 0.9 % NaCl with KCl 20 mEq / L 100 mL/hr at 05/26/12 0832   PRN Meds:.   Ht: 5\' 7"  (170.2 cm)  Wt: 158 lb (71.668 kg)  Ideal Wt: 61.36 kg % Ideal Wt: 117% Wt Readings from Last 10 Encounters:  05/25/12 158 lb (71.668 kg)  05/17/12 156 lb 12 oz (71.1 kg)  05/17/12 156 lb 12 oz (71.1 kg)    Usual Wt: 180 lb per patient  % Usual Wt: 88%  Body mass index is 24.75 kg/(m^2). (WNL)  Food/Nutrition Related Hx: Patient reported she feels hungry. She reported her intake has declined since her surgery. Noted per MD, illeus following lumbar spinal stenosis. Pt with NG for suction.   Labs:  CMP     Component Value Date/Time   NA 135 05/26/2012 0357   K 4.1 05/26/2012 0357   CL 100 05/26/2012 0357   CO2 24 05/26/2012 0357   GLUCOSE 132*  05/26/2012 0357   BUN 19 05/26/2012 0357   CREATININE 0.76 05/26/2012 0357   CALCIUM 9.0 05/26/2012 0357   PROT 6.9 05/23/2012 2341   ALBUMIN 3.5 05/23/2012 2341   AST 17 05/23/2012 2341   ALT 12 05/23/2012 2341   ALKPHOS 90 05/23/2012 2341   BILITOT 0.6 05/23/2012 2341   GFRNONAA 78* 05/26/2012 0357   GFRAA >90 05/26/2012 0357    Intake/Output Summary (Last 24 hours) at 05/26/12 1042 Last data filed at 05/26/12 1610  Gross per 24 hour  Intake 838.33 ml  Output    200 ml  Net 638.33 ml     Diet Order: NPO  Supplements/Tube Feeding: none at this time   IVF:    0.9 % NaCl with KCl 20 mEq / L Last Rate: 100 mL/hr at 05/26/12 9604    Estimated Nutritional Needs:   Kcal: 1500-1700 Protein: 79-93 grams  Fluid: 1 ml per kcal intake   NUTRITION DIAGNOSIS: -Inadequate oral intake (NI-2.1).  Status: Ongoing  RELATED TO: inability to eat   AS EVIDENCE BY: NPO status   MONITORING/EVALUATION(Goals): PO intake, weights, labs, Diet advancements/ tolerance 1. Diet advancements as medically  able to meet > 90% of estimated energy needs.   EDUCATION NEEDS: -No education needs identified at this time  INTERVENTION: 1. Recommend diet advancement as medically able.  2. RD to follow for nutrition plan of care.   Dietitian [phone]  DOCUMENTATION CODES Per approved criteria  -Not Applicable    Iven Finn [facility] 05/26/2012, 10:40 AM

## 2012-05-26 NOTE — Progress Notes (Signed)
Problem: Ileus following lumbar spinal stenosis surgery on May 17, 2012.  History: The patient is a 78 year old female who underwent lumbar spinal stenosis surgery on May 17, 2012. She was admitted to the hospital to evaluate and treat a postoperative ileus confirmed by acute abdominal x-ray series associated with severe constipation, nausea, and vomiting. The patient was prescribed narcotic therapy postoperatively.  On October 12, 2010, the patient underwent a surveillance colonoscopy with removal of a small adenomatous colon polyp.  The patient has chronic combined systolic and diastolic heart failure, left bundle branch block by electrocardiogram, hypertension, non-ischemic cardiomyopathy, hypercholesterolemia, history of a stroke in 2004 requiring chronic Plavix therapy, vitamin B12 deficiency, hypothyroidism, depressive disorder, and osteopenia.  The patient received her Pneumovax in 2001. She received a tetanus toxoid booster in 2009.  The patient's cardiologist is Dr. Verdis Prime. The patient's gastrointestinal surgeon is Dr. Avel Peace. The patient's orthopedist is Dr. Elsie Lincoln. The patient's a neurologist is Dr. Avie Echevaria.  Chronic medication: AcipHex 20 mg each morning. Synthroid 100 mcg each morning. Lorazepam 1 mg at night as needed for anxiety. Metoprolol extended relief 50 mg daily. Quinapril 40 mg daily. Furosemide 20 mg daily. Vitamin B12 1 mg subcutaneously each month. Plavix 75 mg daily. Lexapro 10 mg daily.  Past medical and surgical history: Giant hiatal hernia complicated by iron deficiency anemia and gastroesophageal reflux. Nonischemic cardiomyopathy unassociated with combined systolic and diastolic heart failure. Hypertension. Left bundle branch block. Chronic renal insufficiency. Depression. Stroke in 2004. Left cataract surgery. Left retinal surgery. Tonsillectomy. Osteoarthritis. Vitamin B12 deficiency. Hypothyroidism. Hypercholesterolemia. Hiatal hernia  surgery. Lumbar spinal stenosis surgery.  Allergies: Sulfa. Xopenex. Dilaudid causes nausea.  Habits: The patient does not smoke cigarettes or consume alcohol  Assessment: Ileus following lumbar spinal stenosis surgery.  Plan: Continue nasogastric suction. Avoid narcotic and anti-cholinergic drugs.

## 2012-05-26 NOTE — Progress Notes (Signed)
[facility]  Patient Status: Active (receiving services up to time of hospitalization)  [facility] is providing the following services: PT and HHA  If patient discharges after hours, please call [phone].  Please resume at d/c if needed.  Thank you  Norberta Keens, RN, BSN [phone] 05/26/2012, 3:25 PM

## 2012-05-26 NOTE — Progress Notes (Signed)
CARE MANAGEMENT NOTE 05/26/2012  Patient:  Leslie [name], Leslie [name]   Account Number:  [id number]  Date Initiated:  05/26/2012  Documentation initiated by:  PEARSON,COOKIE  Subjective/Objective Assessment:   pt admitted with cco Nausea and Vomiting     Action/Plan:   plan to dc home   Anticipated DC Date:  05/29/2012   Anticipated DC Plan:  HOME/SELF CARE      DC Planning Services  CM consult      Choice offered to / List presented to:             Status of service:  Completed, signed off Medicare Important Message given?  NA - LOS <3 / Initial given by admissions (If response is "NO", the following Medicare IM given date fields will be blank) Date Medicare IM given:   Date Additional Medicare IM given:    Discharge Disposition:    Per UR Regulation:  Reviewed for med. necessity/level of care/duration of stay  If discussed at Long Length of Stay Meetings, dates discussed:    Comments:  05/26/12 MPearson, RN, BSN Chart reviewed. Pt was active with [facility] with PT, NA. Will need order to resume/if MD agree.

## 2012-05-26 NOTE — Progress Notes (Signed)
During AM Report rounds NG found out & on floor. Pt denies nausea. "Mild" soreness in LLQ abd. Dr. Laural Benes called. Order given to send Pt to Radiology for NG placement to assure correct placement. Also to ask Pt to call her Back Surgeon to let him know she is back in the hospital w/Post-op Ileus.Leslie [name]

## 2012-05-27 ENCOUNTER — Inpatient Hospital Stay (HOSPITAL_COMMUNITY): Payer: Medicare Other

## 2012-05-27 LAB — BASIC METABOLIC PANEL
CO2: 21 mEq/L (ref 19–32)
Chloride: 103 mEq/L (ref 96–112)
Glucose, Bld: 107 mg/dL — ABNORMAL HIGH (ref 70–99)
Potassium: 4.3 mEq/L (ref 3.5–5.1)
Sodium: 135 mEq/L (ref 135–145)

## 2012-05-27 NOTE — Progress Notes (Signed)
Subjective: Denies N/V or abdominal pain. Passing flatus.  Objective: Vital signs in last 24 hours: Temp:  [97.5 F (36.4 C)-98.2 F (36.8 C)] 98.2 F (36.8 C) (10/19 0635) Pulse Rate:  [81-90] 81  (10/19 0635) Resp:  [18-20] 20  (10/19 0635) BP: (177-184)/(72-87) 177/82 mmHg (10/19 0635) SpO2:  [95 %-97 %] 97 % (10/19 1610) Weight change:  Last BM Date: 05/25/12  Intake/Output from previous day: 10/18 0701 - 10/19 0700 In: 622 [I.V.:622] Out: 1350 [Urine:1000; Emesis/NG output:350] Intake/Output this shift: Total I/O In: 1638.3 [I.V.:1638.3] Out: 275 [Urine:275]  General appearance: alert and cooperative Resp: clear to auscultation bilaterally Cardio: regular rate and rhythm, S1, S2 normal, no murmur, click, rub or gallop GI: soft, non-tender; bowel sounds normal; no masses,  no organomegaly  Lab Results:  No results found for this or any previous visit (from the past 24 hour(s)).    Studies/Results: Dg Abd Acute W/chest  05/25/2012  *RADIOLOGY REPORT*  Clinical Data: Nausea and vomiting.  Possible obstruction. Constipation.  Severe abdominal pain.  ACUTE ABDOMEN SERIES (ABDOMEN 2 VIEW & CHEST 1 VIEW)  Comparison: Single view abdomen 05/23/2012.  Two-view chest 05/10/2012.  Findings: The heart size is exaggerated by low lung volumes.  No focal airspace disease is evident.  The mild gaseous distention of large and small bowel is new.  There are some fluid levels within the small bowel.  There is no obstruction.  No free air is present.  IMPRESSION:  1.  No acute cardiopulmonary disease. 2.  Increasing gaseous distention of large and small bowel with scattered fluid levels.  This is most compatible with a diffuse ileus.   Original Report Authenticated By: Jamesetta Orleans. MATTERN, M.D.    Dg Vangie Bicker G Tube Plc W/fl W/rad  05/26/2012  Placement of NG tube with fluoroscopy  After explaining the procedure to the patient and using local anesthesia, I passed an NG tube through the  right nasal passage into the distal stomach without difficulty.  The patient tolerated the procedure well.  Impression:  NG tube placed in the distal stomach with fluoroscopic assistance.   Original Report Authenticated By: Gwynn Burly, M.D.     Medications:  Prior to Admission:  Prescriptions prior to admission  Medication Sig Dispense Refill  . Calcium Carbonate-Vit D-Min (CALCIUM 1200 PO) Take 1 tablet by mouth daily.      . cyanocobalamin (,VITAMIN B-12,) 1000 MCG/ML injection Inject 1,000 mcg into the muscle every 30 (thirty) days.      . diazepam (VALIUM) 2 MG tablet Take 2 mg by mouth every 6 (six) hours as needed. Muscle spasm      . Docusate Sodium (DSS) 100 MG CAPS Take 100 mg by mouth 2 (two) times daily.      Marland Kitchen escitalopram (LEXAPRO) 10 MG tablet Take 10 mg by mouth daily.      . furosemide (LASIX) 20 MG tablet Take 20 mg by mouth every morning.      Marland Kitchen levothyroxine (SYNTHROID, LEVOTHROID) 100 MCG tablet Take 100 mcg by mouth every morning.      . metoprolol succinate (TOPROL-XL) 50 MG 24 hr tablet Take 50 mg by mouth every morning. Take with or immediately following a meal.      . ondansetron (ZOFRAN) 4 MG tablet Take 1 tablet (4 mg total) by mouth every 6 (six) hours. PRN nausea  12 tablet  0  . oxyCODONE-acetaminophen (PERCOCET/ROXICET) 5-325 MG per tablet Take 1-2 tablets by mouth every 4 (four) hours as  needed.  100 tablet  0  . quinapril (ACCUPRIL) 40 MG tablet Take 40 mg by mouth daily.       . RABEprazole (ACIPHEX) 20 MG tablet Take 20 mg by mouth daily as needed. For indigestion       Scheduled:   . enoxaparin (LOVENOX) injection  40 mg Subcutaneous Q24H  . levothyroxine  50 mcg Intravenous Q breakfast  . metoprolol  5 mg Intravenous Q8H  . pantoprazole (PROTONIX) IV  40 mg Intravenous Q24H   Continuous:   . 0.9 % NaCl with KCl 20 mEq / L 100 mL/hr at 05/27/12 0507    Assessment/Plan: Ileus, clinically resolved. Clamp NGT, check  xray start clears if xray  ok. Mobilize, avoid narcotics Recent back surgery Hypertension Hypokalemia check f/u labs  LOS: 2 days   Mouhamad Teed D 05/27/2012, 12:06 PM

## 2012-05-28 MED ORDER — QUINAPRIL HCL 10 MG PO TABS: 40.0000 mg | ORAL_TABLET | Freq: Every day | ORAL | Status: DC

## 2012-05-28 MED ORDER — METOPROLOL SUCCINATE ER 50 MG PO TB24
50.0000 mg | ORAL_TABLET | Freq: Every morning | ORAL | Status: DC
  Administered 2012-05-28 – 2012-05-29 (×2): 50 mg via ORAL
  Filled 2012-05-28 (×2): qty 1

## 2012-05-28 MED ORDER — ESCITALOPRAM OXALATE 10 MG PO TABS
10.0000 mg | ORAL_TABLET | Freq: Every day | ORAL | Status: DC
  Administered 2012-05-28 – 2012-05-29 (×2): 10 mg via ORAL
  Filled 2012-05-28 (×2): qty 1

## 2012-05-28 MED ORDER — FUROSEMIDE 20 MG PO TABS
20.0000 mg | ORAL_TABLET | Freq: Every morning | ORAL | Status: DC
  Administered 2012-05-28 – 2012-05-29 (×2): 20 mg via ORAL
  Filled 2012-05-28 (×2): qty 1

## 2012-05-28 MED ORDER — LISINOPRIL 40 MG PO TABS
40.0000 mg | ORAL_TABLET | Freq: Every day | ORAL | Status: DC
  Administered 2012-05-28 – 2012-05-29 (×2): 40 mg via ORAL
  Filled 2012-05-28 (×2): qty 1

## 2012-05-28 MED ORDER — LEVOTHYROXINE SODIUM 100 MCG PO TABS
100.0000 ug | ORAL_TABLET | Freq: Every day | ORAL | Status: DC
  Administered 2012-05-29: 100 ug via ORAL
  Filled 2012-05-28 (×2): qty 1

## 2012-05-28 MED ORDER — PANTOPRAZOLE SODIUM 40 MG PO TBEC: 40.0000 mg | DELAYED_RELEASE_TABLET | Freq: Every day | ORAL | Status: DC

## 2012-05-28 NOTE — Progress Notes (Signed)
Subjective: No n/v no abdominal pain No f/c Objective: Vital signs in last 24 hours: Temp:  [97.5 F (36.4 C)-99.5 F (37.5 C)] 98.3 F (36.8 C) (10/20 0950) Pulse Rate:  [65-74] 65  (10/20 0950) Resp:  [14-18] 17  (10/20 0950) BP: (148-173)/(66-79) 173/78 mmHg (10/20 0950) SpO2:  [97 %-99 %] 99 % (10/20 0950) Weight change:  Last BM Date: 05/25/12  Intake/Output from previous day: 10/19 0701 - 10/20 0700 In: 3230 [I.V.:3230] Out: 425 [Urine:425] Intake/Output this shift:    General appearance: appears tired Resp: clear to auscultation bilaterally Cardio: regular rate and rhythm, S1, S2 normal, no murmur, click, rub or gallop GI: soft, non-tender; bowel sounds normal; no masses,  no organomegaly Extremities: extremities normal, atraumatic, no cyanosis or edema  Lab Results:  Results for orders placed during the hospital encounter of 05/25/12 (from the past 24 hour(s))  BASIC METABOLIC PANEL     Status: Abnormal   Collection Time   05/27/12 12:50 PM      Component Value Range   Sodium 135  135 - 145 mEq/L   Potassium 4.3  3.5 - 5.1 mEq/L   Chloride 103  96 - 112 mEq/L   CO2 21  19 - 32 mEq/L   Glucose, Bld 107 (*) 70 - 99 mg/dL   BUN 20  6 - 23 mg/dL   Creatinine, Ser 8.11  0.50 - 1.10 mg/dL   Calcium 8.3 (*) 8.4 - 10.5 mg/dL   GFR calc non Af Amer 80 (*) >90 mL/min   GFR calc Af Amer >90  >90 mL/min      Studies/Results: Dg Abd 1 View  05/27/2012  *RADIOLOGY REPORT*  Clinical Data: Ileus  ABDOMEN - 1 VIEW  Comparison: 05/25/2012  Findings: NG tube in the body of the stomach.  Increased bowel gas since the prior study.  Gas in mildly distended large and small bowel loops diffusely compatible with ileus.  There is gas in the rectum.  IMPRESSION: Adynamic ileus with progressive bowel gas since prior study.  NG tube in the body of the stomach.   Original Report Authenticated By: Camelia Phenes, M.D.     Medications:  Prior to Admission:  Prescriptions prior to  admission  Medication Sig Dispense Refill  . Calcium Carbonate-Vit D-Min (CALCIUM 1200 PO) Take 1 tablet by mouth daily.      . cyanocobalamin (,VITAMIN B-12,) 1000 MCG/ML injection Inject 1,000 mcg into the muscle every 30 (thirty) days.      Tery Sanfilippo Sodium (DSS) 100 MG CAPS Take 100 mg by mouth 2 (two) times daily.      Marland Kitchen escitalopram (LEXAPRO) 10 MG tablet Take 10 mg by mouth daily.      . furosemide (LASIX) 20 MG tablet Take 20 mg by mouth every morning.      Marland Kitchen levothyroxine (SYNTHROID, LEVOTHROID) 100 MCG tablet Take 100 mcg by mouth every morning.      . metoprolol succinate (TOPROL-XL) 50 MG 24 hr tablet Take 50 mg by mouth every morning. Take with or immediately following a meal.      . quinapril (ACCUPRIL) 40 MG tablet Take 40 mg by mouth daily.       . RABEprazole (ACIPHEX) 20 MG tablet Take 20 mg by mouth daily as needed. For indigestion      . DISCONTD: diazepam (VALIUM) 2 MG tablet Take 2 mg by mouth every 6 (six) hours as needed. Muscle spasm      . DISCONTD: ondansetron (ZOFRAN)  4 MG tablet Take 1 tablet (4 mg total) by mouth every 6 (six) hours. PRN nausea  12 tablet  0  . DISCONTD: oxyCODONE-acetaminophen (PERCOCET/ROXICET) 5-325 MG per tablet Take 1-2 tablets by mouth every 4 (four) hours as needed.  100 tablet  0   Scheduled:   . enoxaparin (LOVENOX) injection  40 mg Subcutaneous Q24H  . levothyroxine  50 mcg Intravenous Q breakfast  . metoprolol  5 mg Intravenous Q8H  . pantoprazole (PROTONIX) IV  40 mg Intravenous Q24H   Continuous:   . 0.9 % NaCl with KCl 20 mEq / L 100 mL/hr at 05/28/12 1610    Assessment/Plan: Ileus clinically resolved start clear liquids tolerated ice chips w/o problem hypertession Hypokalemia resolved  LOS: 3 days   Mechel Haggard D 05/28/2012, 12:48 PM

## 2012-05-29 MED ORDER — ESCITALOPRAM OXALATE 10 MG PO TABS: 10.0000 mg | ORAL_TABLET | Freq: Every day | ORAL | Status: DC

## 2012-05-29 MED ORDER — FUROSEMIDE 20 MG PO TABS: 20.0000 mg | ORAL_TABLET | Freq: Every morning | ORAL | Status: DC

## 2012-05-29 MED ORDER — LEVOTHYROXINE SODIUM 100 MCG PO TABS: 100.0000 ug | ORAL_TABLET | Freq: Every day | ORAL | Status: DC

## 2012-05-29 NOTE — ED Provider Notes (Signed)
Medical screening examination/treatment/procedure(s) were conducted as a shared visit with non-physician practitioner(s) and myself.  I personally evaluated the patient during the encounter S/p recent back surgery. Nv. Constipation. Xr. Labs.  Suzi Roots, MD 05/29/12 [phone]

## 2012-05-29 NOTE — Discharge Summary (Signed)
Discharge diagnoses: Postoperative ileus  Discharge medications: AcipHex 20 mg each morning. Synthroid 100 mcg each morning. Lorazepam 1 mg nightly as needed. Metoprolol extended release 50 mg daily. Quinapril 40 mg daily. Furosemide 20 mg daily. Vitamin B12 1 mg subcutaneously each month. Calcium 1200 mg daily. Plavix 75 mg daily. Lexapro 10 mg daily.  Office followup: schedule followup with your neurosurgeon  Hospital course: The patient is a 78 year old female who underwent lumbar spinal stenosis surgery on May 17, 2012. The patient was discharged on narcotics for pain control. She was admitted to the hospital with nausea, vomiting, constipation, and an ileus as confirmed by an acute abdominal x-ray series.  A nasogastric tube was placed and the patient was started on intravenous fluids. Narcotics were discontinued.  The patient's postoperative ileus resolved. She was discharged to home in stable medical condition.  Her chronic outpatient medications were continued as prescribed.

## 2012-05-29 NOTE — Progress Notes (Signed)
Discharge instructions given.  Prescriptions sent to pharmacy.  Left via wheelchair with family members.   Edison Nasuti

## 2013-04-26 ENCOUNTER — Telehealth: Payer: Self-pay | Admitting: *Deleted

## 2013-04-26 NOTE — Telephone Encounter (Signed)
Wrong patient

## 2013-04-27 ENCOUNTER — Telehealth: Payer: Self-pay | Admitting: *Deleted

## 2013-04-27 NOTE — Telephone Encounter (Signed)
rx sent

## 2014-06-16 DIAGNOSIS — I5032 Chronic diastolic (congestive) heart failure: Secondary | ICD-10-CM | POA: Insufficient documentation

## 2014-06-16 DIAGNOSIS — I5022 Chronic systolic (congestive) heart failure: Secondary | ICD-10-CM | POA: Insufficient documentation

## 2014-06-16 DIAGNOSIS — I447 Left bundle-branch block, unspecified: Secondary | ICD-10-CM | POA: Insufficient documentation

## 2014-06-16 DIAGNOSIS — I5042 Chronic combined systolic (congestive) and diastolic (congestive) heart failure: Secondary | ICD-10-CM | POA: Insufficient documentation

## 2014-06-16 DIAGNOSIS — I5020 Unspecified systolic (congestive) heart failure: Secondary | ICD-10-CM | POA: Insufficient documentation

## 2014-06-16 HISTORY — DX: Chronic systolic (congestive) heart failure: I50.22

## 2014-06-17 ENCOUNTER — Ambulatory Visit (INDEPENDENT_AMBULATORY_CARE_PROVIDER_SITE_OTHER): Payer: Medicare Other | Admitting: Interventional Cardiology

## 2014-06-17 ENCOUNTER — Encounter: Payer: Self-pay | Admitting: Interventional Cardiology

## 2014-06-17 VITALS — BP 156/76 | HR 60 | Ht 67.0 in | Wt 157.6 lb

## 2014-06-17 DIAGNOSIS — I10 Essential (primary) hypertension: Secondary | ICD-10-CM

## 2014-06-17 DIAGNOSIS — I447 Left bundle-branch block, unspecified: Secondary | ICD-10-CM

## 2014-06-17 DIAGNOSIS — I5022 Chronic systolic (congestive) heart failure: Secondary | ICD-10-CM

## 2014-06-17 NOTE — Patient Instructions (Signed)
Your physician recommends that you continue on your current medications as directed. Please refer to the Current Medication list given to you today.  Your physician wants you to follow-up in: 1 year. You will receive a reminder letter in the mail two months in advance. If you don't receive a letter, please call our office to schedule the follow-up appointment.  

## 2014-06-17 NOTE — Progress Notes (Signed)
Patient ID: VESTA WHEELAND, female   DOB: 1931-04-18, 80 y.o.   MRN: [id number]    [address], [address] [place], [place]  [address] Phone: [phone] Fax:  [phone]  Date:  06/17/2014   ID:  LACORA FOLMER, DOB 27-Mar-1931, MRN [id number]  PCP:  Garlan Fair, MD   ASSESSMENT:  1. Chronic systolic heart failure, clinically stable 2. Hypertension, borderline controlled 3. History of CVA 4. Left bundle branch block  PLAN:  1. Patient is clinically stable. No evidence of heart failure. 2. From cardiac standpoint, if she decides to have knee surgery, I will clear her. She would be at moderate risk for cardiac complications 3. Clinical follow-up in one year   SUBJECTIVE: LAELYNN BLIZZARD is a 80 y.o. female who is doing well. She is accompanied by a friend who now drives for her. She complains mildly about bilateral knee discomfort that is taken away quality of life. She was to have her knees operated upon. She is trending get in with Dr. Adriana Mccallum. She is not had syncope. She denies tachycardia palpitations. No lower extremity swelling. She denies orthopnea and PND. She has occasional heaviness in her chest some nights when she lies down. She says backup 4 minute to a goes away. When she goes recumbent again, it does not recur. She has no limiting exertional dyspnea.   Wt Readings from Last 3 Encounters:  06/17/14 157 lb 9.6 oz (71.487 kg)  05/25/12 158 lb (71.668 kg)  05/17/12 156 lb 12 oz (71.1 kg)     Past Medical History  Diagnosis Date  . Hypertension   . Heart failure, left-sided   . MI (myocardial infarction)   . Bundle branch block left   . Stroke     blurred vision residual  . Shortness of breath     with exertion  . GERD (gastroesophageal reflux disease)     tums  . Arthritis   . Anemia   . Depression   . CHF (congestive heart failure)     chronic combine CHF  . Nonischemic cardiomyopathy     '01 EF 30% with normal coronaries; EF 45-50% 03/2012     Current Outpatient Prescriptions  Medication Sig Dispense Refill  . Calcium Carbonate-Vit D-Min (CALCIUM 1200 PO) Take 1 tablet by mouth daily.    . clopidogrel (PLAVIX) 75 MG tablet Take 75 mg by mouth daily.  3  . cyanocobalamin (,VITAMIN B-12,) 1000 MCG/ML injection Inject 1,000 mcg into the muscle every 30 (thirty) days.    Marland Kitchen escitalopram (LEXAPRO) 10 MG tablet Take 10 mg by mouth daily.    . furosemide (LASIX) 20 MG tablet Take 20 mg by mouth every morning.    Marland Kitchen levothyroxine (SYNTHROID, LEVOTHROID) 75 MCG tablet Take 75 mcg by mouth daily.  2  . LORazepam (ATIVAN) 1 MG tablet Take 1 mg by mouth daily.  5  . metoprolol succinate (TOPROL-XL) 50 MG 24 hr tablet Take 50 mg by mouth every morning. Take with or immediately following a meal.    . quinapril (ACCUPRIL) 40 MG tablet Take 40 mg by mouth daily.     . RABEprazole (ACIPHEX) 20 MG tablet Take 20 mg by mouth daily as needed. For indigestion     No current facility-administered medications for this visit.    Allergies:    Allergies  Allergen Reactions  . Sulfa Antibiotics Other (See Comments)    "urine crystallizes"  . Dilaudid [Hydromorphone Hcl] Nausea Only  Nausea   . Xopenex [Levalbuterol Hcl] Other (See Comments)    "gives me the shakes"  . Zofran [Ondansetron Hcl] Nausea And Vomiting    Complaint of vomiting right after taken med    Social History:  The patient  reports that she has never smoked. She has never used smokeless tobacco. She reports that she does not drink alcohol or use illicit drugs.   ROS:  Please see the history of present illness.   Appetite is stable. No weight gain. Denies patient. She tends to lose her balance to her left. She has had a rare fall related to tripping. No syncope. Denies palpitations   All other systems reviewed and negative.   OBJECTIVE: VS:  BP 156/76 mmHg  Pulse 60  Ht 5\' 7"  (1.702 m)  Wt 157 lb 9.6 oz (71.487 kg)  BMI 24.68 kg/m2 Well nourished, well developed, in no  acute distress, elderly but stable HEENT: normal Neck: JVD flat. Carotid bruit absent  Cardiac:  normal S1, S2; RRR; no murmur Lungs:  clear to auscultation bilaterally, no wheezing, rhonchi or rales Abd: soft, nontender, no hepatomegaly Ext: Edema absent. Pulses 2+ Skin: warm and dry Neuro:  CNs 2-12 intact, no focal abnormalities noted  EKG:  Sinus bradycardia with left bundle branch block and biatrial abnormality.       Signed, Illene Labrador III, MD 06/17/2014 2:47 PM

## 2014-09-13 ENCOUNTER — Telehealth: Payer: Self-pay | Admitting: Interventional Cardiology

## 2014-09-13 NOTE — Telephone Encounter (Signed)
New message     Patient needs a referral to see Dr. Wynelle Link at  [facility].  Please give patient a call.

## 2014-09-13 NOTE — Telephone Encounter (Signed)
Returned pt call.adv her that the request for a  ref to Orthopedic needs to go to her pcp. She verbalized understanding.

## 2014-11-25 ENCOUNTER — Telehealth: Payer: Self-pay

## 2014-11-25 NOTE — Telephone Encounter (Signed)
Cardiac clearance placed in medical records nurse fax box, to be faxed to [facility] fax [phone]

## 2014-12-28 NOTE — H&P (Signed)
TOTAL KNEE ADMISSION H&P  Patient is being admitted for left total knee arthroplasty.  Subjective:  Chief Complaint:    Left knee primary OA / pain.  HPI: Leslie [name], 81 y.o. female, has a history of pain and functional disability in the left knee due to arthritis and has failed non-surgical conservative treatments for greater than 12 weeks to include corticosteriod injections, use of assistive devices and activity modification.  Onset of symptoms was gradual, starting years ago with gradually worsening course since that time. The patient noted no past surgery on the left knee(s).  Patient currently rates pain in the left knee(s) at 9 out of 10 with activity. Patient has worsening of pain with activity and weight bearing, pain that interferes with activities of daily living, pain with passive range of motion, crepitus and joint swelling.  Patient has evidence of periarticular osteophytes and joint space narrowing by imaging studies. There is no active infection.  Risks, benefits and expectations were discussed with the patient.  Risks including but not limited to the risk of anesthesia, blood clots, nerve damage, blood vessel damage, failure of the prosthesis, infection and up to and including death.  Patient understand the risks, benefits and expectations and wishes to proceed with surgery.   PCP: Garlan Fair, MD  D/C Plans:      Home with HHPT  Post-op Meds:       No Rx given  Tranexamic Acid:      To be given - topically  (hx of CVAs)  Decadron:      Is to be given  FYI:     Plavix post-op  Norco post-op  Morphine ok per patient  (NO Dilaudid)    Patient Active Problem List   Diagnosis Date Noted  . LBBB (left bundle branch block) 06/16/2014  . Chronic systolic HF (heart failure) 06/16/2014  . Ileus, postoperative 05/25/2012  . Hypothyroidism 05/25/2012  . HTN (hypertension) 05/25/2012  . Leukocytosis 05/25/2012   Past Medical History  Diagnosis Date  . Hypertension    . Heart failure, left-sided   . MI (myocardial infarction)   . Bundle branch block left   . Stroke     blurred vision residual  . Shortness of breath     with exertion  . GERD (gastroesophageal reflux disease)     tums  . Arthritis   . Anemia   . Depression   . CHF (congestive heart failure)     chronic combine CHF  . Nonischemic cardiomyopathy     '01 EF 30% with normal coronaries; EF 45-50% 03/2012    Past Surgical History  Procedure Laterality Date  . Hernia repair      hiatial  . Eye surgery      bilateral cataracts  . Detached retinia    . Tonsillectomy    . Cardiac catheterization      13 yrs. ago  . Lumbar laminectomy/decompression microdiscectomy  05/17/2012    Procedure: LUMBAR LAMINECTOMY/DECOMPRESSION MICRODISCECTOMY 2 LEVELS;  Surgeon: Ophelia Charter, MD;  Location: [facility];  Service: Neurosurgery;  Laterality: N/A;  Lumbar four laminectomy with bilateral Lumbar three laminotomies and removal of synovial cyst    No prescriptions prior to admission   Allergies  Allergen Reactions  . Sulfa Antibiotics Other (See Comments)    "urine crystallizes"  . Dilaudid [Hydromorphone Hcl] Nausea Only    Nausea   . Xopenex [Levalbuterol Hcl] Other (See Comments)    "gives me the shakes"  . Zofran [  Ondansetron Hcl] Nausea And Vomiting    Complaint of vomiting right after taken med    History  Substance Use Topics  . Smoking status: Never Smoker   . Smokeless tobacco: Never Used  . Alcohol Use: No    Family History  Problem Relation Age of Onset  . CAD Mother   . Hypertension Sister      Review of Systems  Constitutional: Negative.   HENT: Negative.   Eyes: Positive for double vision.  Respiratory: Negative.   Cardiovascular: Negative.   Gastrointestinal: Positive for heartburn.  Genitourinary: Positive for urgency and frequency.  Musculoskeletal: Positive for joint pain.  Skin: Negative.   Neurological: Negative.   Endo/Heme/Allergies: Negative.    Psychiatric/Behavioral: Positive for depression.    Objective:  Physical Exam  Constitutional: She is oriented to person, place, and time. She appears well-developed and well-nourished.  HENT:  Head: Normocephalic and atraumatic.  Eyes: Pupils are equal, round, and reactive to light.  Neck: Neck supple. No JVD present. No tracheal deviation present. No thyromegaly present.  Cardiovascular: Normal rate, regular rhythm, normal heart sounds and intact distal pulses.   Respiratory: Effort normal and breath sounds normal. No stridor. No respiratory distress. She has no wheezes.  GI: Soft. There is no tenderness. There is no guarding.  Musculoskeletal:       Left knee: She exhibits decreased range of motion, swelling and bony tenderness. She exhibits no ecchymosis, no deformity, no laceration and no erythema. Tenderness found.  Lymphadenopathy:    She has no cervical adenopathy.  Neurological: She is alert and oriented to person, place, and time.  Skin: Skin is warm and dry.  Psychiatric: She has a normal mood and affect.     Labs:  Estimated body mass index is 24.68 kg/(m^2) as calculated from the following:   Height as of 06/17/14: 5\' 7"  (1.702 m).   Weight as of 06/17/14: 71.487 kg (157 lb 9.6 oz).   Imaging Review Plain radiographs demonstrate severe degenerative joint disease of the left knee(s). The overall alignment is mild varus. The bone quality appears to be good for age and reported activity level.  Assessment/Plan:  End stage arthritis, left knee   The patient history, physical examination, clinical judgment of the provider and imaging studies are consistent with end stage degenerative joint disease of the left knee(s) and total knee arthroplasty is deemed medically necessary. The treatment options including medical management, injection therapy arthroscopy and arthroplasty were discussed at length. The risks and benefits of total knee arthroplasty were presented and  reviewed. The risks due to aseptic loosening, infection, stiffness, patella tracking problems, thromboembolic complications and other imponderables were discussed. The patient acknowledged the explanation, agreed to proceed with the plan and consent was signed. Patient is being admitted for inpatient treatment for surgery, pain control, PT, OT, prophylactic antibiotics, VTE prophylaxis, progressive ambulation and ADL's and discharge planning. The patient is planning to be discharged home with home health services.     [place] Pugh Carmello Cabiness   PA-C  12/28/2014, 10:37 AM

## 2015-01-10 ENCOUNTER — Telehealth: Payer: Self-pay | Admitting: Interventional Cardiology

## 2015-01-10 NOTE — Telephone Encounter (Signed)
New Prob     Pt is scheduled for a knee replacement 6/14 and calling to verify when she needs to start holding Plavix or any other medications. Please call.

## 2015-01-10 NOTE — Telephone Encounter (Signed)
Patient calling about when to start holding her Plavix for knee replacement surgery. Dr. Tamala Julian has already given a surgical clearance. Will forward to Dr. Tamala Julian for recommendation.

## 2015-01-10 NOTE — Telephone Encounter (Signed)
Called Leslie [name] at [facility] about Surgical clearance and recommendations form Dr. Tamala Julian.

## 2015-01-10 NOTE — Telephone Encounter (Signed)
Hold plavix if requested by surgeon, starting 6 days before surgery.

## 2015-01-10 NOTE — Telephone Encounter (Signed)
Faxed note to [facility].

## 2015-01-14 ENCOUNTER — Telehealth: Payer: Self-pay | Admitting: Interventional Cardiology

## 2015-01-14 NOTE — Telephone Encounter (Signed)
Called patient about Dr. Thompson Caul note. Per Dr. Tamala Julian, Hold plavix if requested by surgeon, starting 6 days before surgery. Informed patient of this note and that it was faxed to her surgeons office. Encouraged patient to call surgeon's office to see what they recommend and if they had any questions to give our office a call. Patient verbalized understanding.

## 2015-01-14 NOTE — Telephone Encounter (Signed)
Follow Up  Pt called states that the Surgeon has not told her what to hold for clearance. Informed pt to call their office as well, Can we please call the pt back to discuss what she will need to hold for the clearance, if possible//sr

## 2015-01-15 NOTE — Patient Instructions (Signed)
Leslie [name]  01/15/2015   Your procedure is scheduled on:     01/21/2015    Report to [facility] Main  Entrance and follow signs to               [place] at    1000 AM.  Call this number if you have problems the morning of surgery [phone]   Remember: ONLY 1 PERSON MAY GO WITH YOU TO SHORT STAY TO GET  READY MORNING OF [place].  Do not eat food or drink liquids :After Midnight.     Take these medicines the morning of surgery with A SIP OF WATER:    Lexapro, synthroid, metoprolol ( lopressor)                                You may not have any metal on your body including hair pins and              piercings  Do not wear jewelry, make-up, lotions, powders or perfumes, deodorant             Do not wear nail polish.  Do not shave  48 hours prior to surgery.               Do not bring valuables to the hospital. [place].  Contacts, dentures or bridgework may not be worn into surgery.  Leave suitcase in the car. After surgery it may be brought to your room.      Special Instructions: coughing and deep breathing exercises, leg exercises               Please read over the following fact sheets you were given: _____________________________________________________________________             [facility] - Preparing for Surgery Before surgery, you can play an important role.  Because skin is not sterile, your skin needs to be as free of germs as possible.  You can reduce the number of germs on your skin by washing with CHG (chlorahexidine gluconate) soap before surgery.  CHG is an antiseptic cleaner which kills germs and bonds with the skin to continue killing germs even after washing. Please DO NOT use if you have an allergy to CHG or antibacterial soaps.  If your skin becomes reddened/irritated stop using the CHG and inform your nurse when you arrive at Short Stay. Do not shave (including legs  and underarms) for at least 48 hours prior to the first CHG shower.  You may shave your face/neck. Please follow these instructions carefully:  1.  Shower with CHG Soap the night before surgery and the  morning of Surgery.  2.  If you choose to wash your hair, wash your hair first as usual with your  normal  shampoo.  3.  After you shampoo, rinse your hair and body thoroughly to remove the  shampoo.                           4.  Use CHG as you would any other liquid soap.  You can apply chg directly  to the skin and wash  Gently with a scrungie or clean washcloth.  5.  Apply the CHG Soap to your body ONLY FROM THE NECK DOWN.   Do not use on face/ open                           Wound or open sores. Avoid contact with eyes, ears mouth and genitals (private parts).                       Wash face,  Genitals (private parts) with your normal soap.             6.  Wash thoroughly, paying special attention to the area where your surgery  will be performed.  7.  Thoroughly rinse your body with warm water from the neck down.  8.  DO NOT shower/wash with your normal soap after using and rinsing off  the CHG Soap.                9.  Pat yourself dry with a clean towel.            10.  Wear clean pajamas.            11.  Place clean sheets on your bed the night of your first shower and do not  sleep with pets. Day of Surgery : Do not apply any lotions/deodorants the morning of surgery.  Please wear clean clothes to the hospital/surgery center.  FAILURE TO FOLLOW THESE INSTRUCTIONS MAY RESULT IN THE CANCELLATION OF YOUR SURGERY PATIENT SIGNATURE_________________________________  NURSE SIGNATURE__________________________________  ________________________________________________________________________  WHAT IS A BLOOD TRANSFUSION? Blood Transfusion Information  A transfusion is the replacement of blood or some of its parts. Blood is made up of multiple cells which provide different  functions.  Red blood cells carry oxygen and are used for blood loss replacement.  White blood cells fight against infection.  Platelets control bleeding.  Plasma helps clot blood.  Other blood products are available for specialized needs, such as hemophilia or other clotting disorders. BEFORE THE TRANSFUSION  Who gives blood for transfusions?   Healthy volunteers who are fully evaluated to make sure their blood is safe. This is blood bank blood. Transfusion therapy is the safest it has ever been in the practice of medicine. Before blood is taken from a donor, a complete history is taken to make sure that person has no history of diseases nor engages in risky social behavior (examples are intravenous drug use or sexual activity with multiple partners). The donor's travel history is screened to minimize risk of transmitting infections, such as malaria. The donated blood is tested for signs of infectious diseases, such as HIV and hepatitis. The blood is then tested to be sure it is compatible with you in order to minimize the chance of a transfusion reaction. If you or a relative donates blood, this is often done in anticipation of surgery and is not appropriate for emergency situations. It takes many days to process the donated blood. RISKS AND COMPLICATIONS Although transfusion therapy is very safe and saves many lives, the main dangers of transfusion include:  1. Getting an infectious disease. 2. Developing a transfusion reaction. This is an allergic reaction to something in the blood you were given. Every precaution is taken to prevent this. The decision to have a blood transfusion has been considered carefully by your caregiver before blood is given. Blood is not given unless the benefits outweigh  the risks. AFTER THE TRANSFUSION  Right after receiving a blood transfusion, you will usually feel much better and more energetic. This is especially true if your red blood cells have gotten low  (anemic). The transfusion raises the level of the red blood cells which carry oxygen, and this usually causes an energy increase.  The nurse administering the transfusion will monitor you carefully for complications. HOME CARE INSTRUCTIONS  No special instructions are needed after a transfusion. You may find your energy is better. Speak with your caregiver about any limitations on activity for underlying diseases you may have. SEEK MEDICAL CARE IF:   Your condition is not improving after your transfusion.  You develop redness or irritation at the intravenous (IV) site. SEEK IMMEDIATE MEDICAL CARE IF:  Any of the following symptoms occur over the next 12 hours:  Shaking chills.  You have a temperature by mouth above 102 F (38.9 C), not controlled by medicine.  Chest, back, or muscle pain.  People around you feel you are not acting correctly or are confused.  Shortness of breath or difficulty breathing.  Dizziness and fainting.  You get a rash or develop hives.  You have a decrease in urine output.  Your urine turns a dark color or changes to pink, red, or brown. Any of the following symptoms occur over the next 10 days:  You have a temperature by mouth above 102 F (38.9 C), not controlled by medicine.  Shortness of breath.  Weakness after normal activity.  The white part of the eye turns yellow (jaundice).  You have a decrease in the amount of urine or are urinating less often.  Your urine turns a dark color or changes to pink, red, or brown. Document Released: 07/23/2000 Document Revised: 10/18/2011 Document Reviewed: 03/11/2008 ExitCare Patient Information 2014 [place].  _______________________________________________________________________  Incentive Spirometer  An incentive spirometer is a tool that can help keep your lungs clear and active. This tool measures how well you are filling your lungs with each breath. Taking long deep breaths may help  reverse or decrease the chance of developing breathing (pulmonary) problems (especially infection) following:  A long period of time when you are unable to move or be active. BEFORE THE PROCEDURE   If the spirometer includes an indicator to show your best effort, your nurse or respiratory therapist will set it to a desired goal.  If possible, sit up straight or lean slightly forward. Try not to slouch.  Hold the incentive spirometer in an upright position. INSTRUCTIONS FOR USE  3. Sit on the edge of your bed if possible, or sit up as far as you can in bed or on a chair. 4. Hold the incentive spirometer in an upright position. 5. Breathe out normally. 6. Place the mouthpiece in your mouth and seal your lips tightly around it. 7. Breathe in slowly and as deeply as possible, raising the piston or the ball toward the top of the column. 8. Hold your breath for 3-5 seconds or for as long as possible. Allow the piston or ball to fall to the bottom of the column. 9. Remove the mouthpiece from your mouth and breathe out normally. 10. Rest for a few seconds and repeat Steps 1 through 7 at least 10 times every 1-2 hours when you are awake. Take your time and take a few normal breaths between deep breaths. 11. The spirometer may include an indicator to show your best effort. Use the indicator as a goal to work  toward during each repetition. 12. After each set of 10 deep breaths, practice coughing to be sure your lungs are clear. If you have an incision (the cut made at the time of surgery), support your incision when coughing by placing a pillow or rolled up towels firmly against it. Once you are able to get out of bed, walk around indoors and cough well. You may stop using the incentive spirometer when instructed by your caregiver.  RISKS AND COMPLICATIONS  Take your time so you do not get dizzy or light-headed.  If you are in pain, you may need to take or ask for pain medication before doing  incentive spirometry. It is harder to take a deep breath if you are having pain. AFTER USE  Rest and breathe slowly and easily.  It can be helpful to keep track of a log of your progress. Your caregiver can provide you with a simple table to help with this. If you are using the spirometer at home, follow these instructions: [place] IF:   You are having difficultly using the spirometer.  You have trouble using the spirometer as often as instructed.  Your pain medication is not giving enough relief while using the spirometer.  You develop fever of 100.5 F (38.1 C) or higher. SEEK IMMEDIATE MEDICAL CARE IF:   You cough up bloody sputum that had not been present before.  You develop fever of 102 F (38.9 C) or greater.  You develop worsening pain at or near the incision site. MAKE SURE YOU:   Understand these instructions.  Will watch your condition.  Will get help right away if you are not doing well or get worse. Document Released: 12/06/2006 Document Revised: 10/18/2011 Document Reviewed: 02/06/2007 [facility] Patient Information 2014 [place], [place].   ________________________________________________________________________

## 2015-01-17 ENCOUNTER — Encounter (HOSPITAL_COMMUNITY)
Admission: RE | Admit: 2015-01-17 | Discharge: 2015-01-17 | Disposition: A | Discharge status: Home / Self Care | Payer: Medicare Other | Source: Ambulatory Visit | Attending: Orthopedic Surgery | Admitting: Orthopedic Surgery

## 2015-01-17 ENCOUNTER — Encounter (HOSPITAL_COMMUNITY): Payer: Self-pay

## 2015-01-17 DIAGNOSIS — Z01818 Encounter for other preprocedural examination: Secondary | ICD-10-CM | POA: Insufficient documentation

## 2015-01-17 DIAGNOSIS — M179 Osteoarthritis of knee, unspecified: Secondary | ICD-10-CM | POA: Diagnosis not present

## 2015-01-17 HISTORY — DX: Hypothyroidism, unspecified: E03.9

## 2015-01-17 HISTORY — DX: Adverse effect of unspecified anesthetic, initial encounter: T41.45XA

## 2015-01-17 HISTORY — DX: Other complications of anesthesia, initial encounter: T88.59XA

## 2015-01-17 HISTORY — DX: Anxiety disorder, unspecified: F41.9

## 2015-01-17 HISTORY — DX: Headache: R51

## 2015-01-17 HISTORY — DX: Headache, unspecified: R51.9

## 2015-01-17 LAB — URINALYSIS, ROUTINE W REFLEX MICROSCOPIC
Bilirubin Urine: NEGATIVE
GLUCOSE, UA: NEGATIVE mg/dL
Ketones, ur: NEGATIVE mg/dL
Leukocytes, UA: NEGATIVE
Nitrite: NEGATIVE
Protein, ur: NEGATIVE mg/dL
SPECIFIC GRAVITY, URINE: 1.02 (ref 1.005–1.030)
Urobilinogen, UA: 0.2 mg/dL (ref 0.0–1.0)
pH: 5.5 (ref 5.0–8.0)

## 2015-01-17 LAB — APTT: aPTT: 31 seconds (ref 24–37)

## 2015-01-17 LAB — PROTIME-INR
INR: 0.96 (ref 0.00–1.49)
Prothrombin Time: 13 seconds (ref 11.6–15.2)

## 2015-01-17 LAB — BASIC METABOLIC PANEL
ANION GAP: 7 (ref 5–15)
BUN: 21 mg/dL — AB (ref 6–20)
CALCIUM: 9 mg/dL (ref 8.9–10.3)
CO2: 27 mmol/L (ref 22–32)
Chloride: 108 mmol/L (ref 101–111)
Creatinine, Ser: 0.99 mg/dL (ref 0.44–1.00)
GFR calc Af Amer: 60 mL/min — ABNORMAL LOW (ref >60)
GFR calc non Af Amer: 52 mL/min — ABNORMAL LOW (ref >60)
GLUCOSE: 93 mg/dL (ref 65–99)
POTASSIUM: 4.2 mmol/L (ref 3.5–5.1)
Sodium: 142 mmol/L (ref 135–145)

## 2015-01-17 LAB — CBC
HEMATOCRIT: 39.6 % (ref 36.0–46.0)
HEMOGLOBIN: 12.3 g/dL (ref 12.0–15.0)
MCH: 28.7 pg (ref 26.0–34.0)
MCHC: 31.1 g/dL (ref 30.0–36.0)
MCV: 92.3 fL (ref 78.0–100.0)
PLATELETS: 203 10*3/uL (ref 150–400)
RBC: 4.29 MIL/uL (ref 3.87–5.11)
RDW: 14.6 % (ref 11.5–15.5)
WBC: 3.9 10*3/uL — AB (ref 4.0–10.5)

## 2015-01-17 LAB — SURGICAL PCR SCREEN
MRSA, PCR: NEGATIVE
Staphylococcus aureus: NEGATIVE

## 2015-01-17 LAB — URINE MICROSCOPIC-ADD ON

## 2015-01-17 NOTE — Progress Notes (Signed)
DR Earle Gell - 01/01/2015  LOV 01/01/2015 on chart  Clearance on chart- Dr Earle Gell - also states getting cardiac clearance from Dr Daneen Schick.   EKG- 06/17/2014 - EPIC  LOV- 06/17/2014 on chart

## 2015-01-17 NOTE — Progress Notes (Signed)
BMP, U/A and micro results done 01/17/2015 faxed via EPIC to Dr Alvan Dame.

## 2015-01-17 NOTE — Progress Notes (Signed)
Orson Slick called for cardiac clearance to be faxed.   Also called Orson Slick and left message that patient stated left knee to be done not right knee and that surgery schedule along with consent states- " right knee".

## 2015-01-17 NOTE — Progress Notes (Signed)
Clearance placed on front of chart- Dr Daneen Schick.

## 2015-01-21 ENCOUNTER — Inpatient Hospital Stay (HOSPITAL_COMMUNITY)
Admission: RE | Admit: 2015-01-21 | Discharge: 2015-01-24 | DRG: 470 | Disposition: A | Discharge status: Home Health | Payer: Medicare Other | Source: Ambulatory Visit | Attending: Orthopedic Surgery | Admitting: Orthopedic Surgery

## 2015-01-21 ENCOUNTER — Encounter (HOSPITAL_COMMUNITY)
Admission: RE | Disposition: A | Discharge status: Home Health | Payer: Self-pay | Source: Ambulatory Visit | Attending: Orthopedic Surgery

## 2015-01-21 ENCOUNTER — Inpatient Hospital Stay (HOSPITAL_COMMUNITY): Payer: Medicare Other | Admitting: Anesthesiology

## 2015-01-21 ENCOUNTER — Encounter (HOSPITAL_COMMUNITY): Payer: Self-pay | Admitting: *Deleted

## 2015-01-21 DIAGNOSIS — I252 Old myocardial infarction: Secondary | ICD-10-CM

## 2015-01-21 DIAGNOSIS — Z8673 Personal history of transient ischemic attack (TIA), and cerebral infarction without residual deficits: Secondary | ICD-10-CM

## 2015-01-21 DIAGNOSIS — K219 Gastro-esophageal reflux disease without esophagitis: Secondary | ICD-10-CM | POA: Diagnosis present

## 2015-01-21 DIAGNOSIS — Z96652 Presence of left artificial knee joint: Secondary | ICD-10-CM

## 2015-01-21 DIAGNOSIS — M659 Synovitis and tenosynovitis, unspecified: Secondary | ICD-10-CM | POA: Diagnosis present

## 2015-01-21 DIAGNOSIS — M1712 Unilateral primary osteoarthritis, left knee: Principal | ICD-10-CM | POA: Diagnosis present

## 2015-01-21 DIAGNOSIS — I5022 Chronic systolic (congestive) heart failure: Secondary | ICD-10-CM | POA: Diagnosis present

## 2015-01-21 DIAGNOSIS — Z96659 Presence of unspecified artificial knee joint: Secondary | ICD-10-CM

## 2015-01-21 DIAGNOSIS — Z01812 Encounter for preprocedural laboratory examination: Secondary | ICD-10-CM | POA: Diagnosis not present

## 2015-01-21 DIAGNOSIS — M25562 Pain in left knee: Secondary | ICD-10-CM | POA: Diagnosis present

## 2015-01-21 DIAGNOSIS — Z8249 Family history of ischemic heart disease and other diseases of the circulatory system: Secondary | ICD-10-CM | POA: Diagnosis not present

## 2015-01-21 DIAGNOSIS — I10 Essential (primary) hypertension: Secondary | ICD-10-CM | POA: Diagnosis present

## 2015-01-21 DIAGNOSIS — E039 Hypothyroidism, unspecified: Secondary | ICD-10-CM | POA: Diagnosis present

## 2015-01-21 HISTORY — PX: TOTAL KNEE ARTHROPLASTY: SHX125

## 2015-01-21 LAB — TYPE AND SCREEN
ABO/RH(D): O POS
Antibody Screen: NEGATIVE

## 2015-01-21 SURGERY — ARTHROPLASTY, KNEE, TOTAL
Anesthesia: General | Site: Knee | Laterality: Left

## 2015-01-21 MED ORDER — KETOROLAC TROMETHAMINE 30 MG/ML IJ SOLN
INTRAMUSCULAR | Status: AC
  Filled 2015-01-21: qty 1

## 2015-01-21 MED ORDER — MENTHOL 3 MG MT LOZG: 1.0000 | LOZENGE | OROMUCOSAL | Status: DC | PRN

## 2015-01-21 MED ORDER — PHENOL 1.4 % MT LIQD: 1.0000 | OROMUCOSAL | Status: DC | PRN

## 2015-01-21 MED ORDER — PROPOFOL 10 MG/ML IV BOLUS
INTRAVENOUS | Status: DC | PRN
  Administered 2015-01-21: 120 mg via INTRAVENOUS

## 2015-01-21 MED ORDER — SODIUM CHLORIDE 0.9 % IJ SOLN
INTRAMUSCULAR | Status: AC
  Filled 2015-01-21: qty 50

## 2015-01-21 MED ORDER — MIDAZOLAM HCL 5 MG/5ML IJ SOLN
INTRAMUSCULAR | Status: DC | PRN
  Administered 2015-01-21: 2 mg via INTRAVENOUS

## 2015-01-21 MED ORDER — SODIUM CHLORIDE 0.9 % IJ SOLN
INTRAMUSCULAR | Status: DC | PRN
Start: 2015-01-21 — End: 2015-01-21
  Administered 2015-01-21: 49 mL

## 2015-01-21 MED ORDER — ROPIVACAINE HCL 5 MG/ML IJ SOLN
INTRAMUSCULAR | Status: AC
  Filled 2015-01-21: qty 30

## 2015-01-21 MED ORDER — FENTANYL CITRATE (PF) 100 MCG/2ML IJ SOLN
INTRAMUSCULAR | Status: DC | PRN
  Administered 2015-01-21 (×5): 50 ug via INTRAVENOUS

## 2015-01-21 MED ORDER — CISATRACURIUM BESYLATE (PF) 10 MG/5ML IV SOLN
INTRAVENOUS | Status: DC | PRN
  Administered 2015-01-21: 12 mg via INTRAVENOUS

## 2015-01-21 MED ORDER — POTASSIUM CHLORIDE 2 MEQ/ML IV SOLN
INTRAVENOUS | Status: DC
  Administered 2015-01-21: 15:00:00 via INTRAVENOUS
  Filled 2015-01-21 (×4): qty 1000

## 2015-01-21 MED ORDER — PROPOFOL 10 MG/ML IV BOLUS
INTRAVENOUS | Status: AC
  Filled 2015-01-21: qty 20

## 2015-01-21 MED ORDER — LIDOCAINE HCL (CARDIAC) 20 MG/ML IV SOLN
INTRAVENOUS | Status: DC | PRN
  Administered 2015-01-21: 50 mg via INTRAVENOUS

## 2015-01-21 MED ORDER — METOPROLOL SUCCINATE ER 50 MG PO TB24
50.0000 mg | ORAL_TABLET | Freq: Every morning | ORAL | Status: DC
  Administered 2015-01-22 – 2015-01-24 (×3): 50 mg via ORAL
  Filled 2015-01-21 (×3): qty 1

## 2015-01-21 MED ORDER — MEPERIDINE HCL 50 MG/ML IJ SOLN: 6.2500 mg | INTRAMUSCULAR | Status: DC | PRN

## 2015-01-21 MED ORDER — TRANEXAMIC ACID 1000 MG/10ML IV SOLN
2000.0000 mg | INTRAVENOUS | Status: DC | PRN
  Administered 2015-01-21: 2000 mg via TOPICAL

## 2015-01-21 MED ORDER — CLOPIDOGREL BISULFATE 75 MG PO TABS
75.0000 mg | ORAL_TABLET | Freq: Every day | ORAL | Status: DC
  Administered 2015-01-22 – 2015-01-24 (×3): 75 mg via ORAL
  Filled 2015-01-21 (×4): qty 1

## 2015-01-21 MED ORDER — NEOSTIGMINE METHYLSULFATE 10 MG/10ML IV SOLN
INTRAVENOUS | Status: DC | PRN
  Administered 2015-01-21: 2 mg via INTRAVENOUS

## 2015-01-21 MED ORDER — NEOSTIGMINE METHYLSULFATE 10 MG/10ML IV SOLN
INTRAVENOUS | Status: AC
  Filled 2015-01-21: qty 1

## 2015-01-21 MED ORDER — LIDOCAINE HCL (CARDIAC) 20 MG/ML IV SOLN
INTRAVENOUS | Status: AC
  Filled 2015-01-21: qty 5

## 2015-01-21 MED ORDER — POLYETHYLENE GLYCOL 3350 17 G PO PACK
17.0000 g | PACK | Freq: Two times a day (BID) | ORAL | Status: DC
  Administered 2015-01-21 – 2015-01-24 (×6): 17 g via ORAL

## 2015-01-21 MED ORDER — SODIUM CHLORIDE 0.9 % IR SOLN
Status: DC | PRN
  Administered 2015-01-21: 1000 mL

## 2015-01-21 MED ORDER — LIDOCAINE HCL 1 % IJ SOLN
INTRAMUSCULAR | Status: AC
  Filled 2015-01-21: qty 20

## 2015-01-21 MED ORDER — LACTATED RINGERS IV SOLN
INTRAVENOUS | Status: DC | PRN
  Administered 2015-01-21 (×2): via INTRAVENOUS

## 2015-01-21 MED ORDER — BUPIVACAINE-EPINEPHRINE (PF) 0.25% -1:200000 IJ SOLN
INTRAMUSCULAR | Status: DC | PRN
Start: 2015-01-21 — End: 2015-01-21
  Administered 2015-01-21: 10 mL

## 2015-01-21 MED ORDER — TRANEXAMIC ACID 1000 MG/10ML IV SOLN
2000.0000 mg | Freq: Once | INTRAVENOUS | Status: DC
  Filled 2015-01-21: qty 20

## 2015-01-21 MED ORDER — CHLORHEXIDINE GLUCONATE 4 % EX LIQD: 60.0000 mL | Freq: Once | CUTANEOUS | Status: DC

## 2015-01-21 MED ORDER — KETOROLAC TROMETHAMINE 30 MG/ML IJ SOLN
INTRAMUSCULAR | Status: DC | PRN
Start: 2015-01-21 — End: 2015-01-21
  Administered 2015-01-21: 30 mg

## 2015-01-21 MED ORDER — FUROSEMIDE 20 MG PO TABS
20.0000 mg | ORAL_TABLET | Freq: Every morning | ORAL | Status: DC
  Administered 2015-01-21 – 2015-01-24 (×4): 20 mg via ORAL
  Filled 2015-01-21 (×4): qty 1

## 2015-01-21 MED ORDER — METHOCARBAMOL 1000 MG/10ML IJ SOLN
500.0000 mg | Freq: Four times a day (QID) | INTRAVENOUS | Status: DC | PRN
  Administered 2015-01-21: 500 mg via INTRAVENOUS
  Filled 2015-01-21 (×2): qty 5

## 2015-01-21 MED ORDER — LEVOTHYROXINE SODIUM 75 MCG PO TABS
75.0000 ug | ORAL_TABLET | Freq: Every day | ORAL | Status: DC
  Administered 2015-01-22 – 2015-01-24 (×3): 75 ug via ORAL
  Filled 2015-01-21 (×4): qty 1

## 2015-01-21 MED ORDER — HYDROCODONE-ACETAMINOPHEN 7.5-325 MG PO TABS
1.0000 | ORAL_TABLET | ORAL | Status: DC
  Administered 2015-01-21 – 2015-01-22 (×2): 2 via ORAL
  Administered 2015-01-22 – 2015-01-24 (×10): 1 via ORAL
  Filled 2015-01-21 (×6): qty 1
  Filled 2015-01-21: qty 2
  Filled 2015-01-21: qty 1
  Filled 2015-01-21: qty 2
  Filled 2015-01-21 (×3): qty 1

## 2015-01-21 MED ORDER — MAGNESIUM CITRATE PO SOLN: 1.0000 | Freq: Once | ORAL | Status: AC | PRN

## 2015-01-21 MED ORDER — METOCLOPRAMIDE HCL 5 MG/ML IJ SOLN: 5.0000 mg | Freq: Three times a day (TID) | INTRAMUSCULAR | Status: DC | PRN

## 2015-01-21 MED ORDER — CEFAZOLIN SODIUM-DEXTROSE 2-3 GM-% IV SOLR
INTRAVENOUS | Status: AC
  Filled 2015-01-21: qty 50

## 2015-01-21 MED ORDER — DIPHENHYDRAMINE HCL 25 MG PO CAPS: 25.0000 mg | ORAL_CAPSULE | Freq: Four times a day (QID) | ORAL | Status: DC | PRN

## 2015-01-21 MED ORDER — FENTANYL CITRATE (PF) 100 MCG/2ML IJ SOLN: 25.0000 ug | INTRAMUSCULAR | Status: DC | PRN

## 2015-01-21 MED ORDER — DEXAMETHASONE SODIUM PHOSPHATE 10 MG/ML IJ SOLN
10.0000 mg | Freq: Once | INTRAMUSCULAR | Status: AC
  Administered 2015-01-22: 10 mg via INTRAVENOUS
  Filled 2015-01-21: qty 1

## 2015-01-21 MED ORDER — BUPIVACAINE-EPINEPHRINE (PF) 0.25% -1:200000 IJ SOLN
INTRAMUSCULAR | Status: AC
  Filled 2015-01-21: qty 30

## 2015-01-21 MED ORDER — ROPIVACAINE HCL 5 MG/ML IJ SOLN
INTRAMUSCULAR | Status: DC | PRN
  Administered 2015-01-21: 30 mL via PERINEURAL

## 2015-01-21 MED ORDER — GLYCOPYRROLATE 0.2 MG/ML IJ SOLN
INTRAMUSCULAR | Status: DC | PRN
  Administered 2015-01-21: .4 mg via INTRAVENOUS
  Administered 2015-01-21: .3 mg via INTRAVENOUS

## 2015-01-21 MED ORDER — ALUM & MAG HYDROXIDE-SIMETH 200-200-20 MG/5ML PO SUSP: 30.0000 mL | ORAL | Status: DC | PRN

## 2015-01-21 MED ORDER — MIDAZOLAM HCL 2 MG/2ML IJ SOLN
INTRAMUSCULAR | Status: AC
  Filled 2015-01-21: qty 2

## 2015-01-21 MED ORDER — DOCUSATE SODIUM 100 MG PO CAPS
100.0000 mg | ORAL_CAPSULE | Freq: Two times a day (BID) | ORAL | Status: DC
  Administered 2015-01-21 – 2015-01-24 (×6): 100 mg via ORAL

## 2015-01-21 MED ORDER — MORPHINE SULFATE 2 MG/ML IJ SOLN: 1.0000 mg | INTRAMUSCULAR | Status: DC | PRN

## 2015-01-21 MED ORDER — FENTANYL CITRATE (PF) 250 MCG/5ML IJ SOLN
INTRAMUSCULAR | Status: AC
  Filled 2015-01-21: qty 5

## 2015-01-21 MED ORDER — GLYCOPYRROLATE 0.2 MG/ML IJ SOLN
INTRAMUSCULAR | Status: AC
  Filled 2015-01-21: qty 3

## 2015-01-21 MED ORDER — LORAZEPAM 1 MG PO TABS
1.0000 mg | ORAL_TABLET | Freq: Every day | ORAL | Status: DC
  Administered 2015-01-22 – 2015-01-23 (×2): 1 mg via ORAL
  Filled 2015-01-21 (×2): qty 1

## 2015-01-21 MED ORDER — CISATRACURIUM BESYLATE 20 MG/10ML IV SOLN
INTRAVENOUS | Status: AC
  Filled 2015-01-21: qty 10

## 2015-01-21 MED ORDER — METHOCARBAMOL 500 MG PO TABS
500.0000 mg | ORAL_TABLET | Freq: Four times a day (QID) | ORAL | Status: DC | PRN
  Administered 2015-01-23 (×2): 500 mg via ORAL
  Filled 2015-01-21 (×2): qty 1

## 2015-01-21 MED ORDER — 0.9 % SODIUM CHLORIDE (POUR BTL) OPTIME
TOPICAL | Status: DC | PRN
  Administered 2015-01-21: 1000 mL

## 2015-01-21 MED ORDER — CEFAZOLIN SODIUM-DEXTROSE 2-3 GM-% IV SOLR
2.0000 g | INTRAVENOUS | Status: AC
  Administered 2015-01-21: 2 g via INTRAVENOUS

## 2015-01-21 MED ORDER — CEFAZOLIN SODIUM-DEXTROSE 2-3 GM-% IV SOLR
2.0000 g | Freq: Four times a day (QID) | INTRAVENOUS | Status: AC
  Administered 2015-01-21 (×2): 2 g via INTRAVENOUS
  Filled 2015-01-21 (×2): qty 50

## 2015-01-21 MED ORDER — PROMETHAZINE HCL 25 MG/ML IJ SOLN: 6.2500 mg | Freq: Four times a day (QID) | INTRAMUSCULAR | Status: DC | PRN

## 2015-01-21 MED ORDER — BISACODYL 10 MG RE SUPP: 10.0000 mg | Freq: Every day | RECTAL | Status: DC | PRN

## 2015-01-21 MED ORDER — FERROUS SULFATE 325 (65 FE) MG PO TABS
325.0000 mg | ORAL_TABLET | Freq: Three times a day (TID) | ORAL | Status: DC
  Administered 2015-01-21 – 2015-01-24 (×7): 325 mg via ORAL
  Filled 2015-01-21 (×11): qty 1

## 2015-01-21 MED ORDER — METOCLOPRAMIDE HCL 10 MG PO TABS: 5.0000 mg | ORAL_TABLET | Freq: Three times a day (TID) | ORAL | Status: DC | PRN

## 2015-01-21 MED ORDER — DEXAMETHASONE SODIUM PHOSPHATE 10 MG/ML IJ SOLN
10.0000 mg | Freq: Once | INTRAMUSCULAR | Status: AC
  Administered 2015-01-21: 10 mg via INTRAVENOUS

## 2015-01-21 MED ORDER — ESCITALOPRAM OXALATE 10 MG PO TABS
10.0000 mg | ORAL_TABLET | Freq: Every day | ORAL | Status: DC
Start: 2015-01-22 — End: 2015-01-24
  Administered 2015-01-22 – 2015-01-24 (×3): 10 mg via ORAL
  Filled 2015-01-21 (×3): qty 1

## 2015-01-21 MED ORDER — PROMETHAZINE HCL 25 MG PO TABS: 12.5000 mg | ORAL_TABLET | Freq: Four times a day (QID) | ORAL | Status: DC | PRN

## 2015-01-21 SURGICAL SUPPLY — 60 items
BAG DECANTER FOR FLEXI CONT (MISCELLANEOUS) ×2 IMPLANT
BAG SPEC THK2 15X12 ZIP CLS (MISCELLANEOUS)
BAG ZIPLOCK 12X15 (MISCELLANEOUS) IMPLANT
BANDAGE ELASTIC 6 VELCRO ST LF (GAUZE/BANDAGES/DRESSINGS) ×3 IMPLANT
BANDAGE ESMARK 6X9 LF (GAUZE/BANDAGES/DRESSINGS) ×1 IMPLANT
BLADE SAW SGTL 13.0X1.19X90.0M (BLADE) ×3 IMPLANT
BNDG CMPR 9X6 STRL LF SNTH (GAUZE/BANDAGES/DRESSINGS) ×1
BNDG ESMARK 6X9 LF (GAUZE/BANDAGES/DRESSINGS) ×3
BOWL SMART MIX CTS (DISPOSABLE) ×3 IMPLANT
CAPT KNEE TOTAL 3 ATTUNE ×2 IMPLANT
CEMENT HV SMART SET (Cement) ×4 IMPLANT
CUFF TOURN SGL QUICK 34 (TOURNIQUET CUFF) ×3
CUFF TRNQT CYL 34X4X40X1 (TOURNIQUET CUFF) ×1 IMPLANT
DECANTER SPIKE VIAL GLASS SM (MISCELLANEOUS) ×3 IMPLANT
DRAPE EXTREMITY T 121X128X90 (DRAPE) ×3 IMPLANT
DRAPE POUCH INSTRU U-SHP 10X18 (DRAPES) ×3 IMPLANT
DRAPE U-SHAPE 47X51 STRL (DRAPES) ×3 IMPLANT
DRSG AQUACEL AG ADV 3.5X10 (GAUZE/BANDAGES/DRESSINGS) ×3 IMPLANT
DURAPREP 26ML APPLICATOR (WOUND CARE) ×6 IMPLANT
ELECT REM PT RETURN 9FT ADLT (ELECTROSURGICAL) ×3
ELECTRODE REM PT RTRN 9FT ADLT (ELECTROSURGICAL) ×1 IMPLANT
FACESHIELD WRAPAROUND (MASK) ×15 IMPLANT
FACESHIELD WRAPAROUND OR TEAM (MASK) ×5 IMPLANT
GLOVE BIO SURGEON STRL SZ7.5 (GLOVE) ×2 IMPLANT
GLOVE BIOGEL PI IND STRL 6.5 (GLOVE) IMPLANT
GLOVE BIOGEL PI IND STRL 7.5 (GLOVE) ×1 IMPLANT
GLOVE BIOGEL PI IND STRL 8.5 (GLOVE) ×1 IMPLANT
GLOVE BIOGEL PI INDICATOR 6.5 (GLOVE) ×4
GLOVE BIOGEL PI INDICATOR 7.5 (GLOVE) ×4
GLOVE BIOGEL PI INDICATOR 8.5 (GLOVE) ×2
GLOVE ECLIPSE 8.0 STRL XLNG CF (GLOVE) ×5 IMPLANT
GLOVE ORTHO TXT STRL SZ7.5 (GLOVE) ×6 IMPLANT
GLOVE SURG SS PI 6.5 STRL IVOR (GLOVE) ×2 IMPLANT
GOWN SPEC L3 XXLG W/TWL (GOWN DISPOSABLE) ×3 IMPLANT
GOWN STRL REUS W/TWL LRG LVL3 (GOWN DISPOSABLE) ×5 IMPLANT
GOWN STRL REUS W/TWL XL LVL3 (GOWN DISPOSABLE) ×2 IMPLANT
HANDPIECE INTERPULSE COAX TIP (DISPOSABLE) ×3
KIT BASIN OR (CUSTOM PROCEDURE TRAY) ×3 IMPLANT
LIQUID BAND (GAUZE/BANDAGES/DRESSINGS) ×3 IMPLANT
MANIFOLD NEPTUNE II (INSTRUMENTS) ×3 IMPLANT
NDL SAFETY ECLIPSE 18X1.5 (NEEDLE) ×1 IMPLANT
NEEDLE HYPO 18GX1.5 SHARP (NEEDLE) ×6
PACK TOTAL JOINT (CUSTOM PROCEDURE TRAY) ×3 IMPLANT
PEN SKIN MARKING BROAD (MISCELLANEOUS) ×3 IMPLANT
POSITIONER SURGICAL ARM (MISCELLANEOUS) ×3 IMPLANT
SET HNDPC FAN SPRY TIP SCT (DISPOSABLE) ×1 IMPLANT
SET PAD KNEE POSITIONER (MISCELLANEOUS) ×3 IMPLANT
SUCTION FRAZIER 12FR DISP (SUCTIONS) ×3 IMPLANT
SUT MNCRL AB 4-0 PS2 18 (SUTURE) ×3 IMPLANT
SUT VIC AB 1 CT1 36 (SUTURE) ×3 IMPLANT
SUT VIC AB 2-0 CT1 27 (SUTURE) ×9
SUT VIC AB 2-0 CT1 TAPERPNT 27 (SUTURE) ×3 IMPLANT
SUT VLOC 180 0 24IN GS25 (SUTURE) ×3 IMPLANT
SYR 50ML LL SCALE MARK (SYRINGE) ×5 IMPLANT
TOWEL OR 17X26 10 PK STRL BLUE (TOWEL DISPOSABLE) ×3 IMPLANT
TOWEL OR NON WOVEN STRL DISP B (DISPOSABLE) ×2 IMPLANT
TRAY FOLEY W/METER SILVER 14FR (SET/KITS/TRAYS/PACK) ×3 IMPLANT
WATER STERILE IRR 1500ML POUR (IV SOLUTION) ×3 IMPLANT
WRAP KNEE MAXI GEL POST OP (GAUZE/BANDAGES/DRESSINGS) ×3 IMPLANT
YANKAUER SUCT BULB TIP 10FT TU (MISCELLANEOUS) ×3 IMPLANT

## 2015-01-21 NOTE — Anesthesia Preprocedure Evaluation (Signed)
Anesthesia Evaluation  Patient identified by MRN, date of birth, ID band Patient awake    Reviewed: Allergy & Precautions, NPO status , Patient's Chart, lab work & pertinent test results  Airway Mallampati: II  TM Distance: >3 FB Neck ROM: Full    Dental no notable dental hx.    Pulmonary neg pulmonary ROS,  breath sounds clear to auscultation  Pulmonary exam normal       Cardiovascular hypertension, +CHF Normal cardiovascular examRhythm:Regular Rate:Normal  LBBB NICM   Neuro/Psych CVA, Residual Symptoms negative psych ROS   GI/Hepatic negative GI ROS, Neg liver ROS,   Endo/Other  negative endocrine ROS  Renal/GU negative Renal ROS  negative genitourinary   Musculoskeletal negative musculoskeletal ROS (+)   Abdominal   Peds negative pediatric ROS (+)  Hematology negative hematology ROS (+)   Anesthesia Other Findings   Reproductive/Obstetrics negative OB ROS                             Anesthesia Physical Anesthesia Plan  ASA: III  Anesthesia Plan: General   Post-op Pain Management:    Induction: Intravenous  Airway Management Planned: Oral ETT  Additional Equipment:   Intra-op Plan:   Post-operative Plan: Extubation in OR  Informed Consent: I have reviewed the patients History and Physical, chart, labs and discussed the procedure including the risks, benefits and alternatives for the proposed anesthesia with the patient or authorized representative who has indicated his/her understanding and acceptance.   Dental advisory given  Plan Discussed with: CRNA  Anesthesia Plan Comments: (Only off plavix for 5 days. Not a candidate for SAB.)        Anesthesia Quick Evaluation

## 2015-01-21 NOTE — Interval H&P Note (Signed)
History and Physical Interval Note:  01/21/2015 8:34 AM  Leslie [name]  has presented today for surgery, with the diagnosis of OA LEFT KNEE  The various methods of treatment have been discussed with the patient and family. After consideration of risks, benefits and other options for treatment, the patient has consented to  Procedure(s): LEFT TOTAL KNEE ARTHROPLASTY (Left) as a surgical intervention .  The patient's history has been reviewed, patient examined, no change in status, stable for surgery.  I have reviewed the patient's chart and labs.  Questions were answered to the patient's satisfaction.     Mauri Pole

## 2015-01-21 NOTE — Anesthesia Postprocedure Evaluation (Signed)
  Anesthesia Post-op Note  Patient: Leslie [name]  Procedure(s) Performed: Procedure(s) (LRB): LEFT TOTAL KNEE ARTHROPLASTY (Left)  Patient Location: PACU  Anesthesia Type: GA combined with regional for post-op pain  Level of Consciousness: awake and alert   Airway and Oxygen Therapy: Patient Spontanous Breathing  Post-op Pain: mild  Post-op Assessment: Post-op Vital signs reviewed, Patient's Cardiovascular Status Stable, Respiratory Function Stable, Patent Airway and No signs of Nausea or vomiting  Last Vitals:  Filed Vitals:   01/21/15 1536  BP: 131/48  [name]: 66  Temp: 36.4 C  Resp: 16    Post-op Vital Signs: stable   Complications: No apparent anesthesia complications

## 2015-01-21 NOTE — Discharge Instructions (Signed)

## 2015-01-21 NOTE — Anesthesia Procedure Notes (Addendum)
Procedure Name: Intubation Date/Time: 01/21/2015 9:50 AM Performed by: Chyrel Masson Pre-anesthesia Checklist: Patient identified, Emergency Drugs available, Suction available, Patient being monitored and Timeout performed Patient Re-evaluated:Patient Re-evaluated prior to inductionOxygen Delivery Method: Circle system utilized and Simple face mask Preoxygenation: Pre-oxygenation with 100% oxygen Intubation Type: IV induction Ventilation: Mask ventilation without difficulty Laryngoscope Size: Mac and 4 Grade View: Grade I Tube type: Oral Tube size: 7.5 mm Number of attempts: 1 Airway Equipment and Method: Stylet Placement Confirmation: ETT inserted through vocal cords under direct vision,  positive ETCO2 and breath sounds checked- equal and bilateral Secured at: 21 cm Tube secured with: Tape Dental Injury: Teeth and Oropharynx as per pre-operative assessment    Anesthesia Regional Block:  Femoral nerve block  Pre-Anesthetic Checklist: ,, timeout performed, Correct Patient, Correct Site, Correct Laterality, Correct Procedure, Correct Position, site marked, Risks and benefits discussed,  Surgical consent,  Pre-op evaluation,  At surgeon's request and post-op pain management  Laterality: Left and Lower  Prep: chloraprep       Needles:  Injection technique: Single-shot  Needle Type: Stimiplex     Needle Length: 10cm 10 cm Needle Gauge: 21 and 21 G    Additional Needles:  Procedures: ultrasound guided (picture in chart) Femoral nerve block Narrative:   Performed by: Personally   Additional Notes: Patient tolerated the procedure well without complications

## 2015-01-21 NOTE — Transfer of Care (Signed)
Immediate Anesthesia Transfer of Care Note  Patient: Leslie [name]  Procedure(s) Performed: Procedure(s): LEFT TOTAL KNEE ARTHROPLASTY (Left)  Patient Location: PACU  Anesthesia Type:General  Level of Consciousness:  sedated, patient cooperative and responds to stimulation  Airway & Oxygen Therapy:Patient Spontanous Breathing and Patient connected to face mask oxgen  Post-op Assessment:  Report given to PACU RN and Post -op Vital signs reviewed and stable  Post vital signs:  Reviewed and stable  Last Vitals:  Filed Vitals:   01/21/15 0930  BP:   [name]: 54  Temp:   Resp: 14    Complications: No apparent anesthesia complications

## 2015-01-21 NOTE — OR Nursing (Signed)
Attempted to insert a foley catheter but was unsuccessful. Made two attempts then decided to stop. Notified anesthesia and Dr.Olin that no catheter was inserted.

## 2015-01-21 NOTE — Op Note (Signed)
NAME:  Leslie [name] RECORD NO.:  [id number]                             FACILITY:  [facility]      PHYSICIAN:  Pietro Cassis. Alvan Dame, M.D.  DATE OF BIRTH:  08/03/1931      DATE OF PROCEDURE:  01/21/2015                                     OPERATIVE REPORT         PREOPERATIVE DIAGNOSIS:  Left knee osteoarthritis.      POSTOPERATIVE DIAGNOSIS:  Left knee osteoarthritis.      FINDINGS:  The patient was noted to have complete loss of cartilage and   bone-on-bone arthritis with associated osteophytes in all three compartments of   the knee with a significant synovitis and associated effusion.      PROCEDURE:  Left total knee replacement.      COMPONENTS USED:  DePuy Attune rotating platform posterior stabilized knee   system, a size 6 femur, 5 tibia, 10 mm PS AOX insert, and 35 anatomic patellar   button.      SURGEON:  Pietro Cassis. Alvan Dame, M.D.      ASSISTANT:  Danae Orleans, PA-C.      ANESTHESIA:  General and Regional.      SPECIMENS:  None.      COMPLICATION:  None.      DRAINS:  None.  EBL: <150cc      TOURNIQUET TIME:   Total Tourniquet Time Documented: Thigh (Left) - 32 minutes Total: Thigh (Left) - 32 minutes  .      The patient was stable to the recovery room.      INDICATION FOR PROCEDURE:  Leslie [name] is a 81 y.o. female patient of   mine.  The patient had been seen, evaluated, and treated conservatively in the   office with medication, activity modification, and injections.  The patient had   radiographic changes of bone-on-bone arthritis with endplate sclerosis and osteophytes noted.      The patient failed conservative measures including medication, injections, and activity modification, and at this point was ready for more definitive measures.   Based on the radiographic changes and failed conservative measures, the patient   decided to proceed with total knee replacement.  Risks of infection,   DVT, component failure, need for  revision surgery, postop course, and   expectations were all   discussed and reviewed.  Consent was obtained for benefit of pain   relief.      PROCEDURE IN DETAIL:  The patient was brought to the operative theater.   Once adequate anesthesia, preoperative antibiotics, 2 gm of Ancef and 10mg  of Decadron (2gm of Tranexamic Acid administered topically upon capsular closure) administered, the patient was positioned supine with the left thigh tourniquet placed.  The  left lower extremity was prepped and draped in sterile fashion.  A time-   out was performed identifying the patient, planned procedure, and   extremity.      The left lower extremity was placed in the [facility] leg holder.  The leg was   exsanguinated, tourniquet elevated to 250 mmHg.  A midline incision was  made followed by median parapatellar arthrotomy.  Following initial   exposure, attention was first directed to the patella.  Precut   measurement was noted to be 21 mm.  I resected down to 14 mm and used a   35 patellar button to restore patellar height as well as cover the cut   surface.      The lug holes were drilled and a metal shim was placed to protect the   patella from retractors and saw blades.      At this point, attention was now directed to the femur.  The femoral   canal was opened with a drill, irrigated to try to prevent fat emboli.  An   intramedullary rod was passed at 3 degrees valgus, 9 mm of bone was   resected off the distal femur.  Following this resection, the tibia was   subluxated anteriorly.  Using the extramedullary guide, 2 mm of bone was resected off   the proximal medial tibia.  We confirmed the gap would be   stable medially and laterally with a 24mm insert as well as confirmed   the cut was perpendicular in the coronal plane, checking with an alignment rod.      Once this was done, I sized the femur to be a size 6 in the anterior-   posterior dimension, chose a narrow component based on  medial and   lateral dimension.  The size 6 rotation block was then pinned in   position anterior referenced using the C-clamp to set rotation.  The   anterior, posterior, and  chamfer cuts were made without difficulty nor   notching making certain that I was along the anterior cortex to help   with flexion gap stability.      The final box cut was made off the lateral aspect of distal femur.      At this point, the tibia was sized to be a size 5, the size 5 tray was   then pinned in position through the medial third of the tubercle,   drilled, and keel punched.  Trial reduction was now carried with a 6L femur,  5 tibia, a size 7 then up to size 10 mm insert, and the 35 patella botton.  The knee was brought to   extension, full extension with good flexion stability with the patella   tracking through the trochlea without application of pressure.  Given   all these findings, the trial components removed.  Final components were   opened and cement was mixed.  The knee was irrigated with normal saline   solution and pulse lavage.  The synovial lining was   then injected with 10cc of 0.25% Marcaine with epinephrine and 1 cc of Toradol plus 30cc of NS for a total of 41 cc.      The knee was irrigated.  Final implants were then cemented onto clean and   dried cut surfaces of bone with the knee brought to extension with a 10 mm trial insert.      Once the cement had fully cured, the excess cement was removed   throughout the knee.  I confirmed I was satisfied with the range of   motion and stability, and the final size 10 mm PS AOX insert was chosen.  It was   placed into the knee.      The tourniquet had been let down at 32 minutes.  No significant   hemostasis required.  The  extensor mechanism was then reapproximated using #1 Vicryl and #0 V-lock sutures with the knee   in flexion.  The   remaining wound was closed with 2-0 Vicryl and running 4-0 Monocryl.   The knee was cleaned, dried,  dressed sterilely using Dermabond and   Aquacel dressing.  The patient was then   brought to recovery room in stable condition, tolerating the procedure   well.   Please note that Physician Assistant, Danae Orleans, PA-C, was present for the entirety of the case, and was utilized for pre-operative positioning, peri-operative retractor management, general facilitation of the procedure.  He was also utilized for primary wound closure at the end of the case.              Pietro Cassis Alvan Dame, M.D.    01/21/2015 11:12 AM

## 2015-01-21 NOTE — Progress Notes (Signed)
AssistedDr. Marcell Barlow with left, femoral block. Side rails up, monitors on throughout procedure. See vital signs in flow sheet. Tolerated Procedure well.

## 2015-01-21 NOTE — Evaluation (Signed)
Physical Therapy Evaluation Patient Details Name: Leslie [name] MRN: [id number] DOB: 14-Apr-1931 Today's Date: 01/21/2015   History of Present Illness  81 yo female s/p L TKA 01/21/15. hx of LBBB, CVA, MI, HF, HTN, lumbar laminectomy/decompression.   Clinical Impression  On eval POD 0, pt required Min assist for bed mobility and Mod assist for standing. Stood EOB for 15-20 seconds-L knee buckling and would not support pt. Deferred further OOB activity. Assisted pt back to supine. Will have KI available on next visit in preparation for attempt at ambulation. Pt reports plan is for home with HHPT and family assisting as needed. Will continue to follow and progress activity as able.     Follow Up Recommendations Home health PT;Supervision/Assistance - 24 hour    Equipment Recommendations  None recommended by PT    Recommendations for Other Services       Precautions / Restrictions Precautions Precautions: Fall;Knee Restrictions Weight Bearing Restrictions: No LLE Weight Bearing: Weight bearing as tolerated      Mobility  Bed Mobility Overal bed mobility: Needs Assistance Bed Mobility: Supine to Sit;Sit to Supine     Supine to sit: Min assist Sit to supine: Min assist   General bed mobility comments: Assist for L LE. Increased time. VCs safety, technique.   Transfers Overall transfer level: Needs assistance Equipment used: Rolling walker (2 wheeled) Transfers: Sit to/from Stand;Lateral/Scoot Transfers Sit to Stand: Mod assist;From elevated surface        Lateral/Scoot Transfers: Min guard General transfer comment: Assist to rise, stabilize, control descent. VCs safety, technique, hand placement. Stood at [employer] ~15-20 seconds-L knee buckling. Deferred transfer/ambulation. Lateral scoot to HOB-Min guard assist.   Ambulation/Gait             General Gait Details: NT-L knee buckling-unsafet to attempt at this time. Will have KI ready on next visit in preparation for  transfers, ambulation  Stairs            Wheelchair Mobility    Modified Rankin (Stroke Patients Only)       Balance                                             Pertinent Vitals/Pain Pain Assessment: Faces Faces Pain Scale: Hurts a little bit Pain Location: L LE Pain Descriptors / Indicators: Nagging Pain Intervention(s): Monitored during session;Repositioned;Ice applied    Home Living Family/patient expects to be discharged to:: Private residence Living Arrangements: Alone Available Help at Discharge: Family ("most of the time") Type of Home: House Home Access: Stairs to enter Entrance Stairs-Rails: Left Entrance Stairs-Number of Steps: 3.. 1 step to [employer] Layout: One level Home Equipment: Environmental consultant - 2 wheels;Cane - single point;Shower seat      Prior Function Level of Independence: Independent               Hand Dominance        Extremity/Trunk Assessment   Upper Extremity Assessment: Generalized weakness           Lower Extremity Assessment: Generalized weakness      Cervical / Trunk Assessment: Normal  Communication   Communication: No difficulties  Cognition Arousal/Alertness: Awake/alert Behavior During Therapy: WFL for tasks assessed/performed Overall Cognitive Status: Within Functional Limits for tasks assessed  General Comments      Exercises        Assessment/Plan    PT Assessment Patient needs continued PT services  PT Diagnosis Difficulty walking;Acute pain   PT Problem List Decreased strength;Decreased range of motion;Decreased activity tolerance;Decreased balance;Decreased mobility;Decreased knowledge of use of DME;Pain  PT Treatment Interventions DME instruction;Gait training;Functional mobility training;Therapeutic activities;Therapeutic exercise;Patient/family education;Balance training   PT Goals (Current goals can be found in the Care Plan section) Acute Rehab PT  Goals Patient Stated Goal: to walk.  PT Goal Formulation: With patient Time For Goal Achievement: 01/28/15 Potential to Achieve Goals: Good    Frequency 7X/week   Barriers to discharge        Co-evaluation               End of Session Equipment Utilized During Treatment: Gait belt Activity Tolerance: Patient tolerated treatment well (Limited by L knee buckling-therapist limited any further OOB activity) Patient left: in bed;with call bell/phone within reach;with family/visitor present           Time: 3846-6599 PT Time Calculation (min) (ACUTE ONLY): 19 min   Charges:   PT Evaluation $Initial PT Evaluation Tier I: 1 Procedure     PT G Codes:        Weston Anna, MPT Pager: [phone]

## 2015-01-22 ENCOUNTER — Encounter (HOSPITAL_COMMUNITY): Payer: Self-pay | Admitting: Orthopedic Surgery

## 2015-01-22 LAB — BASIC METABOLIC PANEL
Anion gap: 5 (ref 5–15)
BUN: 21 mg/dL — AB (ref 6–20)
CALCIUM: 8.4 mg/dL — AB (ref 8.9–10.3)
CO2: 26 mmol/L (ref 22–32)
CREATININE: 0.9 mg/dL (ref 0.44–1.00)
Chloride: 109 mmol/L (ref 101–111)
GFR, EST NON AFRICAN AMERICAN: 58 mL/min — AB (ref >60)
Glucose, Bld: 127 mg/dL — ABNORMAL HIGH (ref 65–99)
Potassium: 5.2 mmol/L — ABNORMAL HIGH (ref 3.5–5.1)
Sodium: 140 mmol/L (ref 135–145)

## 2015-01-22 LAB — CBC
HCT: 30 % — ABNORMAL LOW (ref 36.0–46.0)
Hemoglobin: 9.4 g/dL — ABNORMAL LOW (ref 12.0–15.0)
MCH: 28.3 pg (ref 26.0–34.0)
MCHC: 31.3 g/dL (ref 30.0–36.0)
MCV: 90.4 fL (ref 78.0–100.0)
Platelets: 162 10*3/uL (ref 150–400)
RBC: 3.32 MIL/uL — AB (ref 3.87–5.11)
RDW: 14.3 % (ref 11.5–15.5)
WBC: 8.1 10*3/uL (ref 4.0–10.5)

## 2015-01-22 NOTE — Progress Notes (Signed)
Occupational Therapy Treatment Patient Details Name: Leslie [name] MRN: [id number] DOB: 01-24-1931 Today's Date: 01/22/2015    History of present illness 81 yo female s/p L TKA 01/21/15. hx of LBBB, CVA, MI, HF, HTN, lumbar laminectomy/decompression.    OT comments  Pt is making good progress in OT from this morning.  Will plan to see her tomorrow prior to discharge.  Follow Up Recommendations  Home health OT;Supervision/Assistance - 24 hour    Equipment Recommendations  3 in 1 bedside comode    Recommendations for Other Services      Precautions / Restrictions Precautions Precautions: Knee;Fall Precaution Comments: used L KI due to buckling  Restrictions Weight Bearing Restrictions: No LLE Weight Bearing: Weight bearing as tolerated       Mobility Bed Mobility Overal bed mobility: Needs Assistance Bed Mobility: Supine to Sit;Sit to Supine     Supine to sit: Min assist Sit to supine: Min assist   General bed mobility comments: assist for L LE.   Transfers Overall transfer level: Needs assistance Equipment used: Rolling walker (2 wheeled) Transfers: Sit to/from Stand Sit to Stand: Min assist         General transfer comment: assist to rise and stabilize.  Cues for UE/LE placement    Balance                                   ADL                           Toilet Transfer: Minimal assistance;Ambulation;BSC             General ADL Comments: son present for education.  Ambulated to bathroom with min A and educated son on [place].  Pt was able to let go of walker for hygiene.  Educated son on guarding pt.  Demonstrated shower transfer, but pt did not practice today.      Vision                     Perception     Praxis      Cognition   Behavior During Therapy: WFL for tasks assessed/performed Overall Cognitive Status: Within Functional Limits for tasks assessed                       Extremity/Trunk  Assessment               Exercises    Shoulder Instructions       General Comments      Pertinent Vitals/ Pain       No pain  Home Living                                          Prior Functioning/Environment              Frequency Min 2X/week     Progress Toward Goals  OT Goals(current goals can now be found in the care plan section)  Progress towards OT goals: Progressing toward goals     Plan      Co-evaluation                 End of Session     Activity Tolerance Patient tolerated treatment well   Patient  Left in chair;with call bell/phone within reach   Nurse Communication          Time: 7048-8891 OT Time Calculation (min): 22 min  Charges: OT General Charges $OT Visit: 1 Procedure OT Treatments $Self Care/Home Management : 8-22 mins  Kayla Weekes 01/22/2015, 3:39 PM  [name], OTR/L [phone] 01/22/2015

## 2015-01-22 NOTE — Progress Notes (Signed)
     Subjective: 1 Day Post-Op Procedure(s) (LRB): LEFT TOTAL KNEE ARTHROPLASTY (Left)   Patient reports pain as mild, pain controlled. No events throughout the night.   Objective:   VITALS:   Filed Vitals:   01/22/15 0415  BP: 134/44  Pulse: 52  Temp: 97.6 F (36.4 C)  Resp: 12    Dorsiflexion/Plantar flexion intact Incision: dressing C/D/I No cellulitis present Compartment soft  LABS  Recent Labs  01/22/15 0424  HGB 9.4*  HCT 30.0*  WBC 8.1  PLT 162     Recent Labs  01/22/15 0424  NA 140  K 5.2*  BUN 21*  CREATININE 0.90  GLUCOSE 127*     Assessment/Plan: 1 Day Post-Op Procedure(s) (LRB): LEFT TOTAL KNEE ARTHROPLASTY (Left) Foley cath d/c'ed Advance diet Up with therapy D/C IV fluids Discharge home with home health eventually, when ready       West Pugh. Iylah Dworkin   PAC  01/22/2015, 9:02 AM

## 2015-01-22 NOTE — Progress Notes (Signed)
Physical Therapy Treatment Patient Details Name: EVERLENA MACKLEY MRN: [id number] DOB: June 23, 1931 Today's Date: 01/22/2015    History of Present Illness 81 yo female s/p L TKA 01/21/15. hx of LBBB, CVA, MI, HF, HTN, lumbar laminectomy/decompression.     PT Comments    Progressing with mobility. Will plan to practice steps on tomorrow.   Follow Up Recommendations  Home health PT;Supervision/Assistance - 24 hour     Equipment Recommendations  None recommended by PT    Recommendations for Other Services OT consult     Precautions / Restrictions Precautions Precautions: Knee;Fall Precaution Comments: used L KI due to buckling  Restrictions Weight Bearing Restrictions: No LLE Weight Bearing: Weight bearing as tolerated    Mobility  Bed Mobility Overal bed mobility: Needs Assistance Bed Mobility: Supine to Sit;Sit to Supine     Supine to sit: Min assist Sit to supine: Min assist   General bed mobility comments: assist for L LE.   Transfers Overall transfer level: Needs assistance Equipment used: Rolling walker (2 wheeled) Transfers: Sit to/from Stand Sit to Stand: Min assist         General transfer comment: Assist to rise, stabilize, control descent. Multimodal cues for safety, technique, hand placement. LE placement. Increased time and effort to rise from sitting.   Ambulation/Gait Ambulation/Gait assistance: Min assist Ambulation Distance (Feet): 100 Feet Assistive device: Rolling walker (2 wheeled) Gait Pattern/deviations: Step-to pattern;Trunk flexed;Antalgic     General Gait Details: VCs safety, technique, sequence, posture. Intermittent assist to stabilize.    Stairs            Wheelchair Mobility    Modified Rankin (Stroke Patients Only)       Balance                                    Cognition Arousal/Alertness: Awake/alert Behavior During Therapy: WFL for tasks assessed/performed Overall Cognitive Status: Within  Functional Limits for tasks assessed                      Exercises Total Joint Exercises  Quad Sets: AROM;Both;10 reps;Supine    General Comments        Pertinent Vitals/Pain Pain Assessment: Faces Faces Pain Scale: Hurts a little bit Pain Location: L knee with activity Pain Descriptors / Indicators: Sore Pain Intervention(s): Monitored during session;Repositioned    Home Living                      Prior Function            PT Goals (current goals can now be found in the care plan section) Progress towards PT goals: Progressing toward goals    Frequency  7X/week    PT Plan Current plan remains appropriate    Co-evaluation             End of Session Equipment Utilized During Treatment: Gait belt;Left knee immobilizer Activity Tolerance: Patient tolerated treatment well Patient left: in bed;with call bell/phone within reach;with family/visitor present     Time: 4287-6811 PT Time Calculation (min) (ACUTE ONLY): 35 min  Charges:  $Gait Training: 23-37 mins $Therapeutic Exercise: 8-22 mins                    G Codes:      Weston Anna, MPT Pager: [phone]

## 2015-01-22 NOTE — Care Management Note (Signed)
Case Management Note  Patient Details  Name: JESSI PITSTICK MRN: [id number] Date of Birth: 28-Jan-1931  Subjective/Objective:                   LEFT TOTAL KNEE ARTHROPLASTY (Left) Action/Plan:  Discharge planning Expected Discharge Date:  01/22/15               Expected Discharge Plan:  [place]  In-House Referral:     Discharge planning Services  CM Consult  Post Acute Care Choice:  Home Health Choice offered to:  Patient  DME Arranged:  3-N-1 DME Agency:  [facility] Arranged:  PT, OT HH Agency:  [facility]  Status of Service:  Completed, signed off  Medicare Important Message Given:    Date Medicare IM Given:    Medicare IM give by:    Date Additional Medicare IM Given:    Additional Medicare Important Message give by:     If discussed at [place] of Stay Meetings, dates discussed:    Additional Comments: CM met with pt in room to offer choice of home health agency.  Pt chooses [facility] to render HHPT/OT.  Address and contact information verified by pt.  Referral emailed to [employer], Tim.  CM called [facility] DME rep, Lecretia to please deliver the 3n1 to room prior to discharge.  NO other CM needs were communicated. Dellie Catholic, RN 01/22/2015, 9:57 AM

## 2015-01-22 NOTE — Evaluation (Signed)
Occupational Therapy Evaluation Patient Details Name: Leslie [name] MRN: [id number] DOB: November 28, 1930 Today's Date: 01/22/2015    History of Present Illness 81 yo female s/p L TKA 01/21/15. hx of LBBB, CVA, MI, HF, HTN, lumbar laminectomy/decompression.    Clinical Impression   Pt was admitted for the above surgery.  She was independent with adls prior to admission and will benefit from skilled OT in acute setting.  Pt currently needs mod A for sit to stand and up to max A for LB adls.  Goals in acute are for supervision to min A.    Follow Up Recommendations  Home health OT;Supervision/Assistance - 24 hour (depending upon progress)    Equipment Recommendations  3 in 1 bedside comode    Recommendations for Other Services       Precautions / Restrictions Precautions Precautions: Fall;Knee Restrictions LLE Weight Bearing: Weight bearing as tolerated      Mobility Bed Mobility   Bed Mobility: Supine to Sit;Sit to Supine     Supine to sit: Min assist     General bed mobility comments: assist for LLE, HOB raised 20 degrees  Transfers   Equipment used: Rolling walker (2 wheeled) Transfers: Sit to/from Stand Sit to Stand: Mod assist         General transfer comment: slow transition from sit to stand, mod A to power up and min A to steady    Balance                                            ADL Overall ADL's : Needs assistance/impaired     Grooming: Set up;Sitting   Upper Body Bathing: Set up;Sitting   Lower Body Bathing: Moderate assistance;Sit to/from stand   Upper Body Dressing : Set up;Standing   Lower Body Dressing: Maximal assistance;Bed level   Toilet Transfer: Minimal assistance;Stand-pivot;RW (to recliner)   Toileting- Clothing Manipulation and Hygiene: Maximal assistance;Sit to/from stand         General ADL Comments: performed ADL: from EOB.  Pt was not comfortable letting go of walker with one hand for peri care.   Advanced LLE with cues to get to recliner.  Fatiques quickly.  used KI due to buckling yesterday     Vision     Perception     Praxis      Pertinent Vitals/Pain Faces Pain Scale: Hurts a little bit Pain Location: L knee Pain Descriptors / Indicators: Sore Pain Intervention(s): Monitored during session     Hand Dominance     Extremity/Trunk Assessment Upper Extremity Assessment Upper Extremity Assessment: Generalized weakness           Communication Communication Communication: No difficulties   Cognition Arousal/Alertness: Awake/alert Behavior During Therapy: WFL for tasks assessed/performed Overall Cognitive Status: Within Functional Limits for tasks assessed                     General Comments       Exercises       Shoulder Instructions      Home Living Family/patient expects to be discharged to:: Private residence Living Arrangements: Alone Available Help at Discharge: Family Type of Home: House Home Access: Stairs to enter [employer] of Steps: 3.. 1 step to [employer] Shower/Tub: Occupational psychologist: Standard     Home  Equipment: Gilford Rile - 2 wheels;Cane - single point;Shower seat   Additional Comments: daughter in law will stay in day and son at night      Prior Functioning/Environment Level of Independence: Independent             OT Diagnosis: Generalized weakness   OT Problem List: Decreased strength;Decreased activity tolerance;Decreased knowledge of use of DME or AE;Pain   OT Treatment/Interventions: Self-care/ADL training;DME and/or AE instruction;Patient/family education;Therapeutic activities    OT Goals(Current goals can be found in the care plan section) Acute Rehab OT Goals Patient Stated Goal: to walk.  OT Goal Formulation: With patient Time For Goal Achievement: 01/29/15 Potential to Achieve Goals: Good ADL Goals Pt Will Perform Grooming: with supervision;standing Pt Will  Transfer to Toilet: with min guard assist;ambulating;bedside commode Pt Will Perform Toileting - Clothing Manipulation and hygiene: sit to/from stand;with min assist Pt Will Perform Tub/Shower Transfer: Shower transfer;with min assist;shower seat;ambulating (vs verbalize sequence) Additional ADL Goal #1: family will verbalize understanding of assisting with adls and toilet transfers and be able to cue her for sequencing with sit to stand and ambulating for these activities  OT Frequency: Min 2X/week   Barriers to D/C:            Co-evaluation              End of Session    Activity Tolerance: Patient tolerated treatment well Patient left: in chair;with call bell/phone within reach   Time: 2449-7530 OT Time Calculation (min): 29 min Charges:  OT General Charges $OT Visit: 1 Procedure OT Evaluation $Initial OT Evaluation Tier I: 1 Procedure OT Treatments $Self Care/Home Management : 8-22 mins G-Codes:    Nayah Lukens Feb 17, 2015, 8:48 AM   [name], OTR/L [phone] 2015/02/17

## 2015-01-22 NOTE — Progress Notes (Signed)
Physical Therapy Treatment Patient Details Name: Leslie [name] MRN: [id number] DOB: Apr 16, 1931 Today's Date: 01/22/2015    History of Present Illness 81 yo female s/p L TKA 01/21/15. hx of LBBB, CVA, MI, HF, HTN, lumbar laminectomy/decompression.     PT Comments    Progressing slowly with mobility. L knee still buckling even with KI in place.   Follow Up Recommendations  Home health PT;Supervision/Assistance - 24 hour     Equipment Recommendations  None recommended by PT    Recommendations for Other Services OT consult     Precautions / Restrictions Precautions Precautions: Knee;Fall Precaution Comments: used L KI due to buckling  Restrictions Weight Bearing Restrictions: No LLE Weight Bearing: Weight bearing as tolerated    Mobility  Bed Mobility Overal bed mobility: Needs Assistance Bed Mobility: Sit to Supine       Sit to supine: Min assist   General bed mobility comments: assist for L LE.   Transfers Overall transfer level: Needs assistance Equipment used: Rolling walker (2 wheeled) Transfers: Sit to/from Stand Sit to Stand: Mod assist         General transfer comment: Assist to rise, stabilize, control descent. Multimodal cues for safety, technique, hand placement. LE placement. Increased time and effort to rise from sitting.   Ambulation/Gait Ambulation/Gait assistance: Min assist Ambulation Distance (Feet): 50 Feet Assistive device: Rolling walker (2 wheeled) Gait Pattern/deviations: Step-to pattern;Antalgic;Trunk flexed     General Gait Details: VCs safety, technique, sequence, posture. Cues for pt to focus of L knee ext during stance phase of gait. KI in place but L knees still noted to buckle.   Stairs            Wheelchair Mobility    Modified Rankin (Stroke Patients Only)       Balance                                    Cognition Arousal/Alertness: Awake/alert Behavior During Therapy: WFL for tasks  assessed/performed Overall Cognitive Status: Within Functional Limits for tasks assessed                      Exercises Total Joint Exercises Ankle Circles/Pumps: AROM;Both;10 reps;Supine Quad Sets: AROM;Both;10 reps;Supine Heel Slides: AAROM;Left;10 reps;Supine Hip ABduction/ADduction: AAROM;Left;10 reps;Supine Straight Leg Raises: AAROM;Left;10 reps;Supine (ext lag ~15 degrees) Goniometric ROM: 10-50 degrees    General Comments        Pertinent Vitals/Pain Pain Assessment: Faces Faces Pain Scale: Hurts a little bit Pain Location: L knee with activity Pain Descriptors / Indicators: Sore Pain Intervention(s): Monitored during session;Ice applied;Repositioned    Home Living                      Prior Function            PT Goals (current goals can now be found in the care plan section) Progress towards PT goals: Progressing toward goals (slowly)    Frequency  7X/week    PT Plan Current plan remains appropriate    Co-evaluation             End of Session Equipment Utilized During Treatment: Gait belt;Left knee immobilizer Activity Tolerance: Patient tolerated treatment well Patient left: in bed;with call bell/phone within reach;with family/visitor present     Time: 0865-7846 PT Time Calculation (min) (ACUTE ONLY): 40 min  Charges:  $Gait Training: 23-37 mins $Therapeutic Exercise:  8-22 mins                    G Codes:      Weston Anna, MPT Pager: [phone]

## 2015-01-23 LAB — BASIC METABOLIC PANEL
ANION GAP: 7 (ref 5–15)
BUN: 25 mg/dL — ABNORMAL HIGH (ref 6–20)
CO2: 25 mmol/L (ref 22–32)
Calcium: 8.5 mg/dL — ABNORMAL LOW (ref 8.9–10.3)
Chloride: 108 mmol/L (ref 101–111)
Creatinine, Ser: 0.98 mg/dL (ref 0.44–1.00)
GFR calc Af Amer: >60 mL/min (ref >60)
GFR, EST NON AFRICAN AMERICAN: 52 mL/min — AB (ref >60)
Glucose, Bld: 98 mg/dL (ref 65–99)
Potassium: 4.6 mmol/L (ref 3.5–5.1)
Sodium: 140 mmol/L (ref 135–145)

## 2015-01-23 LAB — CBC
HCT: 29.6 % — ABNORMAL LOW (ref 36.0–46.0)
Hemoglobin: 9.7 g/dL — ABNORMAL LOW (ref 12.0–15.0)
MCH: 30.2 pg (ref 26.0–34.0)
MCHC: 32.8 g/dL (ref 30.0–36.0)
MCV: 92.2 fL (ref 78.0–100.0)
Platelets: 170 10*3/uL (ref 150–400)
RBC: 3.21 MIL/uL — AB (ref 3.87–5.11)
RDW: 14.9 % (ref 11.5–15.5)
WBC: 8 10*3/uL (ref 4.0–10.5)

## 2015-01-23 MED ORDER — DOCUSATE SODIUM 100 MG PO CAPS: 100.0000 mg | ORAL_CAPSULE | Freq: Two times a day (BID) | ORAL | Status: DC

## 2015-01-23 MED ORDER — FERROUS SULFATE 325 (65 FE) MG PO TABS: 325.0000 mg | ORAL_TABLET | Freq: Three times a day (TID) | ORAL | Status: DC

## 2015-01-23 MED ORDER — TIZANIDINE HCL 4 MG PO TABS: 4.0000 mg | ORAL_TABLET | Freq: Four times a day (QID) | ORAL | Status: DC | PRN

## 2015-01-23 MED ORDER — HYDROCODONE-ACETAMINOPHEN 7.5-325 MG PO TABS: 1.0000 | ORAL_TABLET | ORAL | Status: DC | PRN

## 2015-01-23 MED ORDER — POLYETHYLENE GLYCOL 3350 17 G PO PACK: 17.0000 g | PACK | Freq: Two times a day (BID) | ORAL | Status: DC

## 2015-01-23 NOTE — Progress Notes (Signed)
OT Cancellation Note  Patient Details Name: Leslie [name] MRN: [id number] DOB: 1930-10-25   Cancelled Treatment:    Reason Eval/Treat Not Completed: Other -- On first attempt, no family present and session was to focus on family education with son regarding shower transfer. On second attempt, patient working with PT. On third attempt, son had gone home and patient was eating lunch. Patient reports she wants her son to be present for shower transfer practice. He will be back tomorrow morning and OT will see then for family education regarding shower transfer.   Leroi Haque A 01/23/2015, 12:12 PM

## 2015-01-23 NOTE — Progress Notes (Addendum)
     Subjective: 2 Days Post-Op Procedure(s) (LRB): LEFT TOTAL KNEE ARTHROPLASTY (Left)   Patient reports pain as mild, pain controlled. No events throughout the night.  Ready to be discharged home if she does well with PT and pain stays controlled.   Objective:   VITALS:   Filed Vitals:   01/23/15 0611  BP: 128/58  Pulse: 64  Temp: 98 F (36.7 C)  Resp: 16    Dorsiflexion/Plantar flexion intact Incision: dressing C/D/I No cellulitis present Compartment soft  LABS  Recent Labs  01/22/15 0424 01/23/15 0440  HGB 9.4* 9.7*  HCT 30.0* 29.6*  WBC 8.1 8.0  PLT 162 170     Recent Labs  01/22/15 0424 01/23/15 0440  NA 140 140  K 5.2* 4.6  BUN 21* 25*  CREATININE 0.90 0.98  GLUCOSE 127* 98     Assessment/Plan: 2 Days Post-Op Procedure(s) (LRB): LEFT TOTAL KNEE ARTHROPLASTY (Left)  Plavix and movement for anticoagulation. Up with therapy Discharge home with home health if she does well with PT. Follow up in 2 weeks at [facility]. Follow up with OLIN,Glynn Yepes D in 2 weeks.  Contact information:  [facility] [address] [place]   PAC  01/23/2015, 8:47 AM

## 2015-01-23 NOTE — Progress Notes (Signed)
Physical Therapy Treatment Patient Details Name: Leslie [name] MRN: [id number] DOB: 09-Nov-1930 Today's Date: 01/23/2015    History of Present Illness 81 yo female s/p L TKA 01/21/15. hx of LBBB, CVA, MI, HF, HTN, lumbar laminectomy/decompression.     PT Comments    POD # 2 family present for education and safe handling.  Educated on [place] and applied.  Assisted with amb in hallway.  Practiced stairs with family using one rail and one crutch.  Pt appears more fatigued today and unsteady.  Difficulty with stairs as her opposite knee is also "bad". Reported to RN pt is not performing as well as expected and family agrees.  Plan is to D/C to home tomorrow.   Follow Up Recommendations  Home health PT;Supervision/Assistance - 24 hour     Equipment Recommendations  None recommended by PT    Recommendations for Other Services       Precautions / Restrictions Precautions Precautions: Knee;Fall Precaution Comments: used L KI due to buckling  Restrictions Weight Bearing Restrictions: No LLE Weight Bearing: Weight bearing as tolerated    Mobility  Bed Mobility Overal bed mobility: Needs Assistance Bed Mobility: Supine to Sit     Supine to sit: Min assist     General bed mobility comments: assist for L LE plus increased time  Transfers Overall transfer level: Needs assistance Equipment used: Rolling walker (2 wheeled) Transfers: Sit to/from Stand Sit to Stand: Min assist         General transfer comment: assist to rise and stabilize.  Cues for UE/LE placement  Ambulation/Gait Ambulation/Gait assistance: Min assist Ambulation Distance (Feet): 55 Feet Assistive device: Rolling walker (2 wheeled) Gait Pattern/deviations: Step-to pattern;Trunk flexed Gait velocity: decreased   General Gait Details: VCs safety, technique, sequence, posture. Intermittent assist to stabilize. Decreased amb distance this session due to increased c/o fatigue.    Stairs Stairs: Yes Stairs  assistance: Min assist Stair Management: One rail Right;With crutches Number of Stairs: 4 General stair comments: with family present for education and safe handling.   Wheelchair Mobility    Modified Rankin (Stroke Patients Only)       Balance                                    Cognition Arousal/Alertness: Awake/alert (more quiet today per family) Behavior During Therapy: WFL for tasks assessed/performed Overall Cognitive Status: Within Functional Limits for tasks assessed                      Exercises      General Comments        Pertinent Vitals/Pain Pain Assessment: 0-10 Pain Score: 5  Pain Location: L knee Pain Descriptors / Indicators: Sore Pain Intervention(s): Monitored during session;Premedicated before session;Repositioned;Ice applied    Home Living                      Prior Function            PT Goals (current goals can now be found in the care plan section) Progress towards PT goals: Progressing toward goals    Frequency  7X/week    PT Plan      Co-evaluation             End of Session Equipment Utilized During Treatment: Gait belt;Left knee immobilizer Activity Tolerance: Patient limited by fatigue Patient left: in chair;with call  bell/phone within reach;with family/visitor present     Time: 0962-8366 PT Time Calculation (min) (ACUTE ONLY): 35 min  Charges:  $Gait Training: 8-22 mins $Therapeutic Activity: 8-22 mins                    G Codes:      Rica Koyanagi  PTA [facility]  Acute  Rehab Pager      [phone]

## 2015-01-24 NOTE — Progress Notes (Signed)
     Subjective: 3 Days Post-Op Procedure(s) (LRB): LEFT TOTAL KNEE ARTHROPLASTY (Left)   Patient reports pain as mild, pain controlled. No events throughout the night.  States that she hasn't had a BM yet, though she is passing gas.  If she continues to do well and has a BM she can be discharged home.  Objective:   VITALS:   Filed Vitals:   01/24/15 0600  BP: 159/59  Pulse: 68  Temp: 98 F (36.7 C)  Resp: 14    Dorsiflexion/Plantar flexion intact Incision: dressing C/D/I No cellulitis present Compartment soft  LABS  Recent Labs  01/22/15 0424 01/23/15 0440  HGB 9.4* 9.7*  HCT 30.0* 29.6*  WBC 8.1 8.0  PLT 162 170     Recent Labs  01/22/15 0424 01/23/15 0440  NA 140 140  K 5.2* 4.6  BUN 21* 25*  CREATININE 0.90 0.98  GLUCOSE 127* 98     Assessment/Plan: 3 Days Post-Op Procedure(s) (LRB): LEFT TOTAL KNEE ARTHROPLASTY (Left) Up with therapy Discharge home with home health  Follow up in 2 weeks at [facility]. Follow up with OLIN,Tawan Degroote D in 2 weeks.  Contact information:  [facility] [address] [place]   PAC  01/24/2015, 8:36 AM

## 2015-01-24 NOTE — Care Management (Signed)
Confirmed HH PT/OT referral with patient and [employer] rep. Patient received 3n1 to room, and she stated that she does not need a rolling walker as she has one at home already.

## 2015-01-24 NOTE — Progress Notes (Signed)
Physical Therapy Treatment Patient Details Name: Leslie [name] MRN: [id number] DOB: 03-05-1931 Today's Date: 01/24/2015    History of Present Illness 81 yo female s/p L TKA 01/21/15. hx of LBBB, CVA, MI, HF, HTN, lumbar laminectomy/decompression.     PT Comments    POD # 3  Assisted pt out of bathroom to recliner then performed all supine TKR TE's with instruction on proper tech to son.  Instructed on use of ICE.  Instructed on KI use for stairs and side sleeping.  Pt ready for D/C to home today with support from son.   Follow Up Recommendations  Home health PT;Supervision/Assistance - 24 hour (son will be there to assist)     Equipment Recommendations  None recommended by PT    Recommendations for Other Services       Precautions / Restrictions Precautions Precautions: Knee;Fall Precaution Comments: instructed KI use for stairs and side sleeping Restrictions Weight Bearing Restrictions: No LLE Weight Bearing: Weight bearing as tolerated    Mobility  Bed Mobility               General bed mobility comments: Pt OOB in recliner  Transfers                    Ambulation/Gait Ambulation/Gait assistance: Min guard Ambulation Distance (Feet): 8 Feet Assistive device: Rolling walker (2 wheeled) Gait Pattern/deviations: Step-to pattern;Decreased stance time - left;Trunk flexed Gait velocity: decreased   General Gait Details: increased time and decreased amb distance due to increased c/o fatigue.   Stairs Stairs:  (verbally educated/refreshed proper tech with stairs,.)          Wheelchair Mobility    Modified Rankin (Stroke Patients Only)       Balance                                    Cognition Arousal/Alertness: Awake/alert Behavior During Therapy: WFL for tasks assessed/performed Overall Cognitive Status: Within Functional Limits for tasks assessed                      Exercises   Total Knee Replacement TE's 10  reps B LE ankle pumps 10 reps towel squeezes 10 reps knee presses 10 reps heel slides  10 reps SAQ's 10 reps SLR's 10 reps ABD Followed by ICE     General Comments        Pertinent Vitals/Pain      Home Living                      Prior Function            PT Goals (current goals can now be found in the care plan section)      Frequency  7X/week    PT Plan      Co-evaluation             End of Session Equipment Utilized During Treatment: Gait belt;Left knee immobilizer Activity Tolerance: Patient tolerated treatment well Patient left: in chair;with call bell/phone within reach;with family/visitor present     Time: 11:45-12:15    Charges:  $Gait Training: 8-22 mins $Therapeutic Exercise: 8-22 mins                    G Codes:      Rica Koyanagi  PTA [facility]  Acute  Rehab Pager  [phone]  

## 2015-01-24 NOTE — Progress Notes (Signed)
OT Cancellation Note  Patient Details Name: Leslie [name] MRN: [id number] DOB: [birth year]   Cancelled Treatment:    Reason Eval/Treat Not Completed: Other (comment).  Pt feels she will sponge bathe at home due to very small, odd-shaped shower.  Pt/son with no further acute needs.  Will sign off.   Sierra Bissonette 01/24/2015, 10:32 AM  [name], OTR/L [phone] 01/24/2015

## 2015-01-27 NOTE — Discharge Summary (Signed)
Physician Discharge Summary  Patient ID: Leslie [name] MRN: [id number] DOB/AGE: 05/07/1931 81 y.o.  Admit date: 01/21/2015 Discharge date: 01/24/2015   Procedures:  Procedure(s) (LRB): LEFT TOTAL KNEE ARTHROPLASTY (Left)  Attending Physician:  Dr. Paralee Cancel   Admission Diagnoses:   Left knee primary OA / pain  Discharge Diagnoses:  Principal Problem:   S/P left TKA Active Problems:   S/P knee replacement  Past Medical History  Diagnosis Date  . Hypertension   . Heart failure, left-sided   . MI (myocardial infarction)   . Bundle branch block left   . Stroke     blurred vision residual  . GERD (gastroesophageal reflux disease)     tums  . Arthritis   . Anemia   . Depression   . CHF (congestive heart failure)     chronic combine CHF  . Nonischemic cardiomyopathy     '01 EF 30% with normal coronaries; EF 45-50% 03/2012  . Shortness of breath     hx of nothing current on 01/17/2015   . Hypothyroidism   . Anxiety   . Headache     hx of migraines   . Complication of anesthesia     " stomach did not wake up after back surgery "     HPI:    Leslie [name], 81 y.o. female, has a history of pain and functional disability in the left knee due to arthritis and has failed non-surgical conservative treatments for greater than 12 weeks to include corticosteriod injections, use of assistive devices and activity modification. Onset of symptoms was gradual, starting years ago with gradually worsening course since that time. The patient noted no past surgery on the left knee(s). Patient currently rates pain in the left knee(s) at 9 out of 10 with activity. Patient has worsening of pain with activity and weight bearing, pain that interferes with activities of daily living, pain with passive range of motion, crepitus and joint swelling. Patient has evidence of periarticular osteophytes and joint space narrowing by imaging studies. There is no active infection. Risks, benefits and  expectations were discussed with the patient. Risks including but not limited to the risk of anesthesia, blood clots, nerve damage, blood vessel damage, failure of the prosthesis, infection and up to and including death. Patient understand the risks, benefits and expectations and wishes to proceed with surgery.   PCP: Garlan Fair, MD   Discharged Condition: good  Hospital Course:  Patient underwent the above stated procedure on 01/21/2015. Patient tolerated the procedure well and brought to the recovery room in good condition and subsequently to the floor.  POD #1 BP: 134/44 ; [name]: 52 ; Temp: 97.6 F (36.4 C) ; Resp: 12 Patient reports pain as mild, pain controlled. No events throughout the night.  Dorsiflexion/plantar flexion intact, incision: dressing C/D/I, no cellulitis present and compartment soft.   LABS  Basename    HGB  9.4  HCT  30.0   POD #2  BP: 128/58 ; [name]: 64 ; Temp: 98 F (36.7 C) ; Resp: 16 Patient reports pain as mild, pain controlled. No events throughout the night.Passing gas, but no BM yet. Dorsiflexion/plantar flexion intact, incision: dressing C/D/I, no cellulitis present and compartment soft.   LABS  Basename    HGB  9.7  HCT  29.6   POD #3  BP: 159/59 ; [name]: 86 ; Temp: 98 F (36.7 C) ; Resp: 14 Patient reports pain as mild, pain controlled. No events throughout the night. States  that she hasn't had a BM yet, though she is passing gas. If she continues to do well and has a BM she can be discharged home. Dorsiflexion/plantar flexion intact, incision: dressing C/D/I, no cellulitis present and compartment soft.   LABS   No new labs  Discharge Exam: General appearance: alert, cooperative and no distress Extremities: Homans sign is negative, no sign of DVT, no edema, redness or tenderness in the calves or thighs and no ulcers, gangrene or trophic changes  Disposition: Home with follow up in 2 weeks   Follow-up Information    Follow up with  Mauri Pole, MD. Schedule an appointment as soon as possible for a visit in 2 weeks.   Specialty:  Orthopedic Surgery   Contact information:   [address] [place] [address] [phone]       Follow up with [facility].   Why:  home health physical therapy and occupational therapy   Contact information:   [place] [address] [phone]       Discharge Instructions    Call MD / Call 911    Complete by:  As directed   If you experience chest pain or shortness of breath, CALL 911 and be transported to the hospital emergency room.  If you develope a fever above 101 F, pus (white drainage) or increased drainage or redness at the wound, or calf pain, call your surgeon's office.     Change dressing    Complete by:  As directed   Maintain surgical dressing until follow up in the clinic. If the edges start to pull up, may reinforce with tape. If the dressing is no longer working, may remove and cover with gauze and tape, but must keep the area dry and clean.  Call with any questions or concerns.     Constipation Prevention    Complete by:  As directed   Drink plenty of fluids.  Prune juice may be helpful.  You may use a stool softener, such as Colace (over the counter) 100 mg twice a day.  Use MiraLax (over the counter) for constipation as needed.     Diet - low sodium heart healthy    Complete by:  As directed      Discharge instructions    Complete by:  As directed   Maintain surgical dressing until follow up in the clinic. If the edges start to pull up, may reinforce with tape. If the dressing is no longer working, may remove and cover with gauze and tape, but must keep the area dry and clean.  Follow up in 2 weeks at [facility]. Call with any questions or concerns.     Increase activity slowly as tolerated    Complete by:  As directed      TED hose    Complete by:  As directed   Use stockings (TED hose) for 2  weeks on both leg(s).  You may remove them at night for sleeping.     Weight bearing as tolerated    Complete by:  As directed   Laterality:  left  Extremity:  Lower             Medication List    TAKE these medications        CALCIUM 1200 PO  Take 1 tablet by mouth daily.     clopidogrel 75 MG tablet  Commonly known as:  PLAVIX  Take 75 mg by mouth daily.  1000     cyanocobalamin 1000 MCG/ML injection  Commonly known as:  (VITAMIN B-12)  Inject 1,000 mcg into the muscle every 30 (thirty) days.     docusate sodium 100 MG capsule  Commonly known as:  COLACE  Take 1 capsule (100 mg total) by mouth 2 (two) times daily.     escitalopram 10 MG tablet  Commonly known as:  LEXAPRO  Take 10 mg by mouth daily.     ferrous sulfate 325 (65 FE) MG tablet  Take 1 tablet (325 mg total) by mouth 3 (three) times daily after meals.     furosemide 20 MG tablet  Commonly known as:  LASIX  Take 20 mg by mouth every morning.     HYDROcodone-acetaminophen 7.5-325 MG per tablet  Commonly known as:  NORCO  Take 1-2 tablets by mouth every 4 (four) hours as needed for moderate pain.     levothyroxine 75 MCG tablet  Commonly known as:  SYNTHROID, LEVOTHROID  Take 75 mcg by mouth daily.     LORazepam 1 MG tablet  Commonly known as:  ATIVAN  Take 1 mg by mouth daily. Patient takes at nite     metoprolol succinate 50 MG 24 hr tablet  Commonly known as:  TOPROL-XL  Take 50 mg by mouth every morning. Take with or immediately following a meal.     polyethylene glycol packet  Commonly known as:  MIRALAX / GLYCOLAX  Take 17 g by mouth 2 (two) times daily.     quinapril 40 MG tablet  Commonly known as:  ACCUPRIL  Take 40 mg by mouth daily.     tiZANidine 4 MG tablet  Commonly known as:  ZANAFLEX  Take 1 tablet (4 mg total) by mouth every 6 (six) hours as needed for muscle spasms.         Signed: West Pugh. Autry Droege   PA-C  01/27/2015, 9:33 AM

## 2015-02-27 ENCOUNTER — Other Ambulatory Visit: Payer: Self-pay | Admitting: Gastroenterology

## 2015-02-27 ENCOUNTER — Ambulatory Visit
Admission: RE | Admit: 2015-02-27 | Discharge: 2015-02-27 | Disposition: A | Discharge status: Home / Self Care | Payer: Medicare Other | Source: Ambulatory Visit | Attending: Gastroenterology | Admitting: Gastroenterology

## 2015-02-27 ENCOUNTER — Other Ambulatory Visit: Payer: Self-pay | Admitting: Internal Medicine

## 2015-02-27 ENCOUNTER — Inpatient Hospital Stay
Admission: RE | Admit: 2015-02-27 | Discharge: 2015-02-27 | Disposition: A | Discharge status: Home / Self Care | Payer: Self-pay | Source: Ambulatory Visit | Attending: Internal Medicine | Admitting: Internal Medicine

## 2015-02-27 DIAGNOSIS — R9389 Abnormal findings on diagnostic imaging of other specified body structures: Secondary | ICD-10-CM

## 2015-02-27 DIAGNOSIS — R0602 Shortness of breath: Secondary | ICD-10-CM

## 2015-02-28 ENCOUNTER — Other Ambulatory Visit: Payer: Self-pay

## 2015-02-28 ENCOUNTER — Ambulatory Visit (HOSPITAL_COMMUNITY): Payer: Medicare Other | Attending: Cardiology

## 2015-02-28 ENCOUNTER — Other Ambulatory Visit: Payer: Self-pay | Admitting: Gastroenterology

## 2015-02-28 DIAGNOSIS — I358 Other nonrheumatic aortic valve disorders: Secondary | ICD-10-CM | POA: Diagnosis not present

## 2015-02-28 DIAGNOSIS — I351 Nonrheumatic aortic (valve) insufficiency: Secondary | ICD-10-CM | POA: Insufficient documentation

## 2015-02-28 DIAGNOSIS — I071 Rheumatic tricuspid insufficiency: Secondary | ICD-10-CM | POA: Diagnosis not present

## 2015-02-28 DIAGNOSIS — I371 Nonrheumatic pulmonary valve insufficiency: Secondary | ICD-10-CM | POA: Diagnosis not present

## 2015-02-28 DIAGNOSIS — I340 Nonrheumatic mitral (valve) insufficiency: Secondary | ICD-10-CM | POA: Diagnosis not present

## 2015-02-28 DIAGNOSIS — R0600 Dyspnea, unspecified: Secondary | ICD-10-CM

## 2015-02-28 DIAGNOSIS — I059 Rheumatic mitral valve disease, unspecified: Secondary | ICD-10-CM | POA: Insufficient documentation

## 2015-03-13 ENCOUNTER — Telehealth: Payer: Self-pay | Admitting: Physician Assistant

## 2015-03-13 NOTE — Telephone Encounter (Signed)
03/18/2015 Received medical records packet from [facility] for upcoming appointment with Tarri Fuller, PA.  Records given to [facility].

## 2015-03-17 ENCOUNTER — Inpatient Hospital Stay (HOSPITAL_COMMUNITY): Admission: RE | Admit: 2015-03-17 | Payer: Medicare Other | Source: Ambulatory Visit | Admitting: Orthopedic Surgery

## 2015-03-17 ENCOUNTER — Encounter (HOSPITAL_COMMUNITY): Admission: RE | Payer: Self-pay | Source: Ambulatory Visit

## 2015-03-17 SURGERY — ARTHROPLASTY, KNEE, TOTAL
Anesthesia: Spinal | Site: Knee | Laterality: Right

## 2015-03-18 ENCOUNTER — Ambulatory Visit: Payer: Medicare Other | Admitting: Physician Assistant

## 2015-03-21 NOTE — Telephone Encounter (Signed)
DO NOT NEED

## 2015-03-26 ENCOUNTER — Other Ambulatory Visit: Payer: Self-pay | Admitting: Gastroenterology

## 2015-03-26 DIAGNOSIS — Z1231 Encounter for screening mammogram for malignant neoplasm of breast: Secondary | ICD-10-CM

## 2015-04-02 ENCOUNTER — Ambulatory Visit
Admission: RE | Admit: 2015-04-02 | Discharge: 2015-04-02 | Disposition: A | Discharge status: Home / Self Care | Payer: Medicare Other | Source: Ambulatory Visit | Attending: Gastroenterology | Admitting: Gastroenterology

## 2015-04-02 DIAGNOSIS — Z1231 Encounter for screening mammogram for malignant neoplasm of breast: Secondary | ICD-10-CM

## 2015-04-03 ENCOUNTER — Other Ambulatory Visit: Payer: Self-pay | Admitting: Gastroenterology

## 2015-04-03 DIAGNOSIS — R928 Other abnormal and inconclusive findings on diagnostic imaging of breast: Secondary | ICD-10-CM

## 2015-04-08 ENCOUNTER — Other Ambulatory Visit: Payer: Medicare Other

## 2015-04-24 ENCOUNTER — Ambulatory Visit
Admission: RE | Admit: 2015-04-24 | Discharge: 2015-04-24 | Disposition: A | Discharge status: Home / Self Care | Payer: Medicare Other | Source: Ambulatory Visit | Attending: Gastroenterology | Admitting: Gastroenterology

## 2015-04-24 DIAGNOSIS — R928 Other abnormal and inconclusive findings on diagnostic imaging of breast: Secondary | ICD-10-CM

## 2015-06-04 ENCOUNTER — Telehealth: Payer: Self-pay | Admitting: *Deleted

## 2015-06-04 NOTE — Telephone Encounter (Signed)
Received cardiac clearance request from [facility] for R TKA scheduled 07/22/15. Placed in cardiac clearance folder for Dr. Thompson Caul review.

## 2015-06-05 ENCOUNTER — Ambulatory Visit (INDEPENDENT_AMBULATORY_CARE_PROVIDER_SITE_OTHER): Payer: Medicare Other | Admitting: Interventional Cardiology

## 2015-06-05 ENCOUNTER — Encounter: Payer: Self-pay | Admitting: Interventional Cardiology

## 2015-06-05 VITALS — BP 150/74 | HR 54 | Ht 68.0 in | Wt 158.1 lb

## 2015-06-05 DIAGNOSIS — I447 Left bundle-branch block, unspecified: Secondary | ICD-10-CM | POA: Diagnosis not present

## 2015-06-05 DIAGNOSIS — I5022 Chronic systolic (congestive) heart failure: Secondary | ICD-10-CM

## 2015-06-05 DIAGNOSIS — I10 Essential (primary) hypertension: Secondary | ICD-10-CM

## 2015-06-05 DIAGNOSIS — I5032 Chronic diastolic (congestive) heart failure: Secondary | ICD-10-CM | POA: Diagnosis not present

## 2015-06-05 DIAGNOSIS — Z01810 Encounter for preprocedural cardiovascular examination: Secondary | ICD-10-CM

## 2015-06-05 NOTE — Progress Notes (Signed)
Cardiology Office Note   Date:  06/05/2015   ID:  ATHEA HALEY, DOB 01-11-1931, MRN [id number]  PCP:  Garlan Fair, MD  Cardiologist:  Sinclair Grooms, MD   Chief Complaint  Patient presents with  . Congestive Heart Failure      History of Present Illness: Leslie [name] is a 81 y.o. female who presents for left bundle branch block, chronic diastolic heart failure (EF greater than 50%), history of CVA, and essential hypertension.  There are no cardiovascular complaints. She underwent successful left knee total replacement earlier this year.  She denies angina, orthopnea, palpitations, syncope, and edema. No episodes of PND.    Past Medical History  Diagnosis Date  . Hypertension   . Heart failure, left-sided ([place])   . MI (myocardial infarction) ([place])   . Bundle branch block left   . Stroke [facility])     blurred vision residual  . GERD (gastroesophageal reflux disease)     tums  . Arthritis   . Anemia   . Depression   . CHF (congestive heart failure) (HCC)     chronic combine CHF  . Nonischemic cardiomyopathy ([place])     '01 EF 30% with normal coronaries; EF 45-50% 03/2012  . Shortness of breath     hx of nothing current on 01/17/2015   . Hypothyroidism   . Anxiety   . Headache     hx of migraines   . Complication of anesthesia     " stomach did not wake up after back surgery "     Past Surgical History  Procedure Laterality Date  . Hernia repair      hiatial  . Eye surgery      bilateral cataracts  . Detached retinia    . Tonsillectomy    . Cardiac catheterization      13 yrs. ago  . Lumbar laminectomy/decompression microdiscectomy  05/17/2012    Procedure: LUMBAR LAMINECTOMY/DECOMPRESSION MICRODISCECTOMY 2 LEVELS;  Surgeon: Ophelia Charter, MD;  Location: [facility];  Service: Neurosurgery;  Laterality: N/A;  Lumbar four laminectomy with bilateral Lumbar three laminotomies and removal of synovial cyst  . Hiatal hernia surgery     . Total knee  arthroplasty Left 01/21/2015    Procedure: LEFT TOTAL KNEE ARTHROPLASTY;  Surgeon: Paralee Cancel, MD;  Location: [facility];  Service: Orthopedics;  Laterality: Left;     Current Outpatient Prescriptions  Medication Sig Dispense Refill  . Calcium Carbonate-Vit D-Min (CALCIUM 1200 PO) Take 1 tablet by mouth daily.    . clopidogrel (PLAVIX) 75 MG tablet Take 75 mg by mouth daily. 1000  3  . cyanocobalamin (,VITAMIN B-12,) 1000 MCG/ML injection Inject 1,000 mcg into the muscle every 30 (thirty) days.    Marland Kitchen escitalopram (LEXAPRO) 10 MG tablet Take 10 mg by mouth daily.    . furosemide (LASIX) 20 MG tablet Take 20 mg by mouth every morning.    Marland Kitchen levothyroxine (SYNTHROID, LEVOTHROID) 75 MCG tablet Take 75 mcg by mouth daily.  2  . LORazepam (ATIVAN) 1 MG tablet Take 1 mg by mouth daily. Patient takes at nite  5  . metoprolol succinate (TOPROL-XL) 50 MG 24 hr tablet Take 50 mg by mouth every morning. Take with or immediately following a meal.    . quinapril (ACCUPRIL) 40 MG tablet Take 40 mg by mouth daily.      No current facility-administered medications for this visit.    Allergies:   Sulfa antibiotics; Dilaudid; Xopenex;  and Zofran    Social History:  The patient  reports that she has never smoked. She has never used smokeless tobacco. She reports that she does not drink alcohol or use illicit drugs.   Family History:  The patient's family history includes CAD in her mother; Hypertension in her sister.    ROS:  Please see the history of present illness.   Otherwise, review of systems are positive for double vision, right knee pain, and exertional intolerance although not changed..   All other systems are reviewed and negative.    PHYSICAL EXAM: VS:  BP 150/74 mmHg  Pulse 54  Ht 5\' 8"  (1.727 m)  Wt 71.723 kg (158 lb 1.9 oz)  BMI 24.05 kg/m2 , BMI Body mass index is 24.05 kg/(m^2). GEN: Well nourished, well developed, in no acute distress HEENT: normal Neck: no JVD, carotid bruits, or  masses Cardiac: RRR.  There is no murmur, rub, or gallop. There is a split S2 that closes slightly with expiration. There is no edema. Respiratory:  clear to auscultation bilaterally, normal work of breathing. GI: soft, nontender, nondistended, + BS MS: no deformity or atrophy Skin: warm and dry, no rash Neuro:  Strength and sensation are intact Psych: euthymic mood, full affect   EKG:  EKG is ordered today. The ekg reveals normal sinus rhythm/sinus bradycardia with left bundle branch block   Recent Labs: 01/23/2015: BUN 25*; Creatinine, Ser 0.98; Hemoglobin 9.7*; Platelets 170; Potassium 4.6; Sodium 140    Lipid Panel No results found for: CHOL, TRIG, HDL, CHOLHDL, VLDL, LDLCALC, LDLDIRECT    Wt Readings from Last 3 Encounters:  06/05/15 71.723 kg (158 lb 1.9 oz)  01/21/15 72.122 kg (159 lb)  01/17/15 72.122 kg (159 lb)      Other studies Reviewed: Additional studies/ records that were reviewed today include: None. The findings include none.    ASSESSMENT AND PLAN:  1. Chronic systolic HF (heart failure) (HCC) Prior LVEF 35%. Most recent study done in July 2016 revealed EF of 55%. There is no evidence of volume overload. Systolic heart failure has improved to chronic diastolic heart failure.  - EKG 12-Lead  2. LBBB (left bundle branch block) Unchanged. - EKG 12-Lead  3. Essential hypertension Controlled with retaken blood pressure 132/68 mmHg - EKG 12-Lead  4. Preoperative cardiovascular exam Cleared for upcoming orthopedic surgery on right knee.  Current medicines are reviewed at length with the patient today.  The patient has the following concerns regarding medicines: None.  The following changes/actions have been instituted:    Maintain current medical regimen  Cleared for upcoming right knee total replacement by Dr.Olin  Labs/ tests ordered today include: none  No orders of the defined types were placed in this encounter.     Disposition:   FU  with HS in 1 year  Signed, Sinclair Grooms, MD  06/05/2015 12:18 PM    [address], [place], [place]  [address] Phone: [phone]; Fax: [phone]

## 2015-06-05 NOTE — Patient Instructions (Signed)
Your physician recommends that you continue on your current medications as directed. Please refer to the Current Medication list given to you today.  Your physician wants you to follow-up in: 1 year with Dr.Smith You will receive a reminder letter in the mail two months in advance. If you don't receive a letter, please call our office to schedule the follow-up appointment.  

## 2015-06-06 NOTE — Telephone Encounter (Signed)
cardiac clearance signed by Dr. Tamala Julian yesterday afternoon. Placed in nurse fax bin in medical records to be faxed.

## 2015-07-10 NOTE — H&P (Signed)
TOTAL KNEE ADMISSION H&P  Patient is being admitted for right total knee arthroplasty.  Subjective:  Chief Complaint:   Right knee primary OA / pain  HPI: Leslie [name], 81 y.o. female, has a history of pain and functional disability in the right knee due to arthritis and has failed non-surgical conservative treatments for greater than 12 weeks to include NSAID's and/or analgesics, corticosteriod injections, use of assistive devices and activity modification.  Onset of symptoms was gradual, starting years ago with gradually worsening course since that time. The patient noted prior procedures on the knee to include  arthroplasty on the left knee on 01/21/2015 per Dr. Alvan Dame.  Patient currently rates pain in the right knee(s) at 9 out of 10 with activity. Patient has worsening of pain with activity and weight bearing, pain that interferes with activities of daily living, pain with passive range of motion, crepitus and joint swelling.  Patient has evidence of periarticular osteophytes and joint space narrowing by imaging studies.  There is no active infection.  Risks, benefits and expectations were discussed with the patient.  Risks including but not limited to the risk of anesthesia, blood clots, nerve damage, blood vessel damage, failure of the prosthesis, infection and up to and including death.  Patient understand the risks, benefits and expectations and wishes to proceed with surgery.   PCP: Garlan Fair, MD  D/C Plans:      Home with HHPT  Post-op Meds:       No Rx given  Tranexamic Acid:      To be given - IV  Decadron:      Is to be given  FYI:     Plavix post-op  Norco post-op  Morphine ok per patient (No Dilaudid)  Concern for bowels to "wake up" prior to d/c home   Patient Active Problem List   Diagnosis Date Noted  . S/P left TKA 01/21/2015  . S/P knee replacement 01/21/2015  . LBBB (left bundle branch block) 06/16/2014  . Chronic diastolic HF (heart failure) ([place])  06/16/2014  . Ileus, postoperative 05/25/2012  . Hypothyroidism 05/25/2012  . HTN (hypertension) 05/25/2012  . Leukocytosis 05/25/2012   Past Medical History  Diagnosis Date  . Hypertension   . Heart failure, left-sided ([place])   . MI (myocardial infarction) ([place])   . Bundle branch block left   . Stroke [facility])     blurred vision residual  . GERD (gastroesophageal reflux disease)     tums  . Arthritis   . Anemia   . Depression   . CHF (congestive heart failure) (HCC)     chronic combine CHF  . Nonischemic cardiomyopathy ([place])     '01 EF 30% with normal coronaries; EF 45-50% 03/2012  . Shortness of breath     hx of nothing current on 01/17/2015   . Hypothyroidism   . Anxiety   . Headache     hx of migraines   . Complication of anesthesia     " stomach did not wake up after back surgery "     Past Surgical History  Procedure Laterality Date  . Hernia repair      hiatial  . Eye surgery      bilateral cataracts  . Detached retinia    . Tonsillectomy    . Cardiac catheterization      13 yrs. ago  . Lumbar laminectomy/decompression microdiscectomy  05/17/2012    Procedure: LUMBAR LAMINECTOMY/DECOMPRESSION MICRODISCECTOMY 2 LEVELS;  Surgeon: Ophelia Charter, MD;  Location: [facility];  Service: Neurosurgery;  Laterality: N/A;  Lumbar four laminectomy with bilateral Lumbar three laminotomies and removal of synovial cyst  . Hiatal hernia surgery     . Total knee arthroplasty Left 01/21/2015    Procedure: LEFT TOTAL KNEE ARTHROPLASTY;  Surgeon: Paralee Cancel, MD;  Location: [facility];  Service: Orthopedics;  Laterality: Left;    No prescriptions prior to admission   Allergies  Allergen Reactions  . Sulfa Antibiotics Other (See Comments)    "urine crystallizes"  . Dilaudid [Hydromorphone Hcl] Nausea Only    Nausea   . Xopenex [Levalbuterol Hcl] Other (See Comments)    "gives me the shakes"  . Zofran [Ondansetron Hcl] Nausea And Vomiting    Complaint of vomiting right after  taken med    Social History  Substance Use Topics  . Smoking status: Never Smoker   . Smokeless tobacco: Never Used  . Alcohol Use: No    Family History  Problem Relation Age of Onset  . CAD Mother   . Hypertension Sister      Review of Systems  Constitutional: Negative.   HENT: Positive for hearing loss.   Eyes: Negative.   Respiratory: Positive for shortness of breath (on exertion).   Cardiovascular: Negative.   Gastrointestinal: Positive for heartburn.  Genitourinary: Positive for urgency and frequency.  Musculoskeletal: Positive for joint pain.  Skin: Negative.   Neurological: Positive for headaches.  Endo/Heme/Allergies: Negative.   Psychiatric/Behavioral: Positive for depression. The patient is nervous/anxious.     Objective:  Physical Exam  Constitutional: She appears well-developed and well-nourished.  HENT:  Head: Normocephalic and atraumatic.  Eyes: Pupils are equal, round, and reactive to light.  Neck: Neck supple. No JVD present. No tracheal deviation present. No thyromegaly present.  Cardiovascular: Normal rate, regular rhythm, normal heart sounds and intact distal pulses.   Respiratory: Effort normal and breath sounds normal. No stridor. No respiratory distress. She has no wheezes.  GI: Soft. There is no tenderness. There is no guarding.  Musculoskeletal:       Right knee: She exhibits decreased range of motion, swelling and bony tenderness. She exhibits no ecchymosis, no deformity, no laceration and no erythema. Tenderness found.  Lymphadenopathy:    She has no cervical adenopathy.  Neurological: She is alert.  Skin: Skin is warm and dry.      Labs:  Estimated body mass index is 24.90 kg/(m^2) as calculated from the following:   Height as of 01/21/15: 5\' 7"  (1.702 m).   Weight as of 01/17/15: 72.122 kg (159 lb).   Imaging Review Plain radiographs demonstrate severe degenerative joint disease of the right knee(s).  The bone quality appears to be  good for age and reported activity level.  Assessment/Plan:  End stage arthritis, right knee   The patient history, physical examination, clinical judgment of the provider and imaging studies are consistent with end stage degenerative joint disease of the right knee(s) and total knee arthroplasty is deemed medically necessary. The treatment options including medical management, injection therapy arthroscopy and arthroplasty were discussed at length. The risks and benefits of total knee arthroplasty were presented and reviewed. The risks due to aseptic loosening, infection, stiffness, patella tracking problems, thromboembolic complications and other imponderables were discussed. The patient acknowledged the explanation, agreed to proceed with the plan and consent was signed. Patient is being admitted for inpatient treatment for surgery, pain control, PT, OT, prophylactic antibiotics, VTE prophylaxis, progressive ambulation and ADL's and discharge planning. The patient is  planning to be discharged home with home health services.      West Pugh Jayna Mulnix   PA-C  07/10/2015, 5:44 PM

## 2015-07-16 ENCOUNTER — Encounter (HOSPITAL_COMMUNITY)
Admission: RE | Admit: 2015-07-16 | Discharge: 2015-07-16 | Disposition: A | Discharge status: Home / Self Care | Payer: Medicare Other | Source: Ambulatory Visit | Attending: Orthopedic Surgery | Admitting: Orthopedic Surgery

## 2015-07-16 ENCOUNTER — Telehealth: Payer: Self-pay | Admitting: Interventional Cardiology

## 2015-07-16 ENCOUNTER — Encounter (HOSPITAL_COMMUNITY): Payer: Self-pay

## 2015-07-16 DIAGNOSIS — M179 Osteoarthritis of knee, unspecified: Secondary | ICD-10-CM | POA: Insufficient documentation

## 2015-07-16 DIAGNOSIS — Z01818 Encounter for other preprocedural examination: Secondary | ICD-10-CM | POA: Insufficient documentation

## 2015-07-16 HISTORY — DX: Burn of cornea and conjunctival sac, unspecified eye, initial encounter: T26.10XA

## 2015-07-16 LAB — URINALYSIS, ROUTINE W REFLEX MICROSCOPIC
BILIRUBIN URINE: NEGATIVE
Glucose, UA: NEGATIVE mg/dL
KETONES UR: NEGATIVE mg/dL
Leukocytes, UA: NEGATIVE
NITRITE: NEGATIVE
PH: 6.5 (ref 5.0–8.0)
PROTEIN: NEGATIVE mg/dL
Specific Gravity, Urine: 1.008 (ref 1.005–1.030)

## 2015-07-16 LAB — CBC
HEMATOCRIT: 41.4 % (ref 36.0–46.0)
HEMOGLOBIN: 13.1 g/dL (ref 12.0–15.0)
MCH: 28.7 pg (ref 26.0–34.0)
MCHC: 31.6 g/dL (ref 30.0–36.0)
MCV: 90.8 fL (ref 78.0–100.0)
Platelets: 226 10*3/uL (ref 150–400)
RBC: 4.56 MIL/uL (ref 3.87–5.11)
RDW: 14.4 % (ref 11.5–15.5)
WBC: 5.1 10*3/uL (ref 4.0–10.5)

## 2015-07-16 LAB — BASIC METABOLIC PANEL
ANION GAP: 6 (ref 5–15)
BUN: 19 mg/dL (ref 6–20)
CHLORIDE: 105 mmol/L (ref 101–111)
CO2: 30 mmol/L (ref 22–32)
Calcium: 9.4 mg/dL (ref 8.9–10.3)
Creatinine, Ser: 0.92 mg/dL (ref 0.44–1.00)
GFR calc non Af Amer: 56 mL/min — ABNORMAL LOW (ref >60)
GLUCOSE: 99 mg/dL (ref 65–99)
Potassium: 5.1 mmol/L (ref 3.5–5.1)
Sodium: 141 mmol/L (ref 135–145)

## 2015-07-16 LAB — URINE MICROSCOPIC-ADD ON: BACTERIA UA: NONE SEEN

## 2015-07-16 LAB — PROTIME-INR
INR: 0.98 (ref 0.00–1.49)
Prothrombin Time: 13.2 seconds (ref 11.6–15.2)

## 2015-07-16 LAB — SURGICAL PCR SCREEN
MRSA, PCR: NEGATIVE
Staphylococcus aureus: NEGATIVE

## 2015-07-16 LAB — APTT: aPTT: 30 seconds (ref 24–37)

## 2015-07-16 NOTE — Patient Instructions (Addendum)
Leslie [name]  07/16/2015   Your procedure is scheduled on:   07-22-2015 Tuesday  Enter through [facility]  Entrance and follow signs to [facility] to [place]. Arrive at  0700      AM ..  (Limit 1 person with you).  Call this number if you have problems the morning of surgery: [phone]  Or Presurgical Testing [phone] days before.   For Living Will and/or Health Care Power Attorney Forms: please provide copy for your medical record,may bring AM of surgery(Forms should be already notarized -we do not provide this service).(Yes/ information provided in 6'16 previous visit.).      Do not eat food/ or drink: After Midnight.      Take these medicines the morning of surgery with A SIP OF WATER-   (DO NOT TAKE ANY DIABETIC MEDS AM OF SURGERY) : Escitalopram. Levothyroxine. Metoprolol. Plavix use per MD instructions.    Do not wear jewelry, make-up or nail polish.  Do not wear deodorant, lotions, powders, or perfumes.   Do not shave legs and under arms- 48 hours(2 days) prior to first CHG shower.(Shaving face and neck okay.)  Do not bring valuables to the hospital.(Hospital is not responsible for lost valuables).  Contacts, dentures or removable bridgework, body piercing, hair pins may not be worn into surgery.  Leave suitcase in the car. After surgery it may be brought to your room.  For patients admitted to the hospital, checkout time is 11:00 AM the day of discharge.(Restricted visitors-Any Persons displaying flu-like symptoms or illness).    Patients discharged the day of surgery will not be allowed to drive home. Must have responsible person with you x 24 hours once discharged.  Name and phone number of your driver: son- Harman Mox [phone]      Please read over the following fact sheets that you were given:  CHG(Chlorhexidine Gluconate 4% Surgical Soap) use, MRSA Information, Blood Transfusion fact sheet, Incentive Spirometry  Instruction.  Remember : Type/Screen "Blue armbands" - may not be removed once applied(would result in being retested AM of surgery, if removed).         Leslie [name] - Preparing for Surgery Before surgery, you can play an important role.  Because skin is not sterile, your skin needs to be as free of germs as possible.  You can reduce the number of germs on your skin by washing with CHG (chlorahexidine gluconate) soap before surgery.  CHG is an antiseptic cleaner which kills germs and bonds with the skin to continue killing germs even after washing. Please DO NOT use if you have an allergy to CHG or antibacterial soaps.  If your skin becomes reddened/irritated stop using the CHG and inform your nurse when you arrive at Short Stay. Do not shave (including legs and underarms) for at least 48 hours prior to the first CHG shower.  You may shave your face/neck. Please follow these instructions carefully:  1.  Shower with CHG Soap the night before surgery and the  morning of Surgery.  2.  If you choose to wash your hair, wash your hair first as usual with your  normal  shampoo.  3.  After you shampoo, rinse your hair and body thoroughly to remove the  shampoo.                           4.  Use CHG as you would any other liquid  soap.  You can apply chg directly  to the skin and wash                       Gently with a scrungie or clean washcloth.  5.  Apply the CHG Soap to your body ONLY FROM THE NECK DOWN.   Do not use on face/ open                           Wound or open sores. Avoid contact with eyes, ears mouth and genitals (private parts).                       Wash face,  Genitals (private parts) with your normal soap.             6.  Wash thoroughly, paying special attention to the area where your surgery  will be performed.  7.  Thoroughly rinse your body with warm water from the neck down.  8.  DO NOT shower/wash with your normal soap after using and rinsing off  the CHG Soap.                 9.  Pat yourself dry with a clean towel.            10.  Wear clean pajamas.            11.  Place clean sheets on your bed the night of your first shower and do not  sleep with pets. Day of Surgery : Do not apply any lotions/deodorants the morning of surgery.  Please wear clean clothes to the hospital/surgery center.  FAILURE TO FOLLOW THESE INSTRUCTIONS MAY RESULT IN THE CANCELLATION OF YOUR SURGERY PATIENT SIGNATURE_________________________________  NURSE SIGNATURE__________________________________  ________________________________________________________________________   Leslie [name]  An incentive spirometer is a tool that can help keep your lungs clear and active. This tool measures how well you are filling your lungs with each breath. Taking long deep breaths may help reverse or decrease the chance of developing breathing (pulmonary) problems (especially infection) following:  A long period of time when you are unable to move or be active. BEFORE THE PROCEDURE   If the spirometer includes an indicator to show your best effort, your nurse or respiratory therapist will set it to a desired goal.  If possible, sit up straight or lean slightly forward. Try not to slouch.  Hold the incentive spirometer in an upright position. INSTRUCTIONS FOR USE   Sit on the edge of your bed if possible, or sit up as far as you can in bed or on a chair.  Hold the incentive spirometer in an upright position.  Breathe out normally.  Place the mouthpiece in your mouth and seal your lips tightly around it.  Breathe in slowly and as deeply as possible, raising the piston or the ball toward the top of the column.  Hold your breath for 3-5 seconds or for as long as possible. Allow the piston or ball to fall to the bottom of the column.  Remove the mouthpiece from your mouth and breathe out normally.  Rest for a few seconds and repeat Steps 1 through 7 at least 10 times every 1-2 hours  when you are awake. Take your time and take a few normal breaths between deep breaths.  The spirometer may include an indicator to show your best effort. Use the indicator as a goal  to work toward during each repetition.  After each set of 10 deep breaths, practice coughing to be sure your lungs are clear. If you have an incision (the cut made at the time of surgery), support your incision when coughing by placing a pillow or rolled up towels firmly against it. Once you are able to get out of bed, walk around indoors and cough well. You may stop using the incentive spirometer when instructed by your caregiver.  RISKS AND COMPLICATIONS  Take your time so you do not get dizzy or light-headed.  If you are in pain, you may need to take or ask for pain medication before doing incentive spirometry. It is harder to take a deep breath if you are having pain. AFTER USE  Rest and breathe slowly and easily.  It can be helpful to keep track of a log of your progress. Your caregiver can provide you with a simple table to help with this. If you are using the spirometer at home, follow these instructions: [place] IF:   You are having difficultly using the spirometer.  You have trouble using the spirometer as often as instructed.  Your pain medication is not giving enough relief while using the spirometer.  You develop fever of 100.5 F (38.1 C) or higher. SEEK IMMEDIATE MEDICAL CARE IF:   You cough up bloody sputum that had not been present before.  You develop fever of 102 F (38.9 C) or greater.  You develop worsening pain at or near the incision site. MAKE SURE YOU:   Understand these instructions.  Will watch your condition.  Will get help right away if you are not doing well or get worse. Document Released: 12/06/2006 Document Revised: 10/18/2011 Document Reviewed: 02/06/2007 ExitCare Patient Information 2014 ExitCare,  [place].   ________________________________________________________________________  WHAT IS A BLOOD TRANSFUSION? Blood Transfusion Information  A transfusion is the replacement of blood or some of its parts. Blood is made up of multiple cells which provide different functions.  Red blood cells carry oxygen and are used for blood loss replacement.  White blood cells fight against infection.  Platelets control bleeding.  Plasma helps clot blood.  Other blood products are available for specialized needs, such as hemophilia or other clotting disorders. BEFORE THE TRANSFUSION  Who gives blood for transfusions?   Healthy volunteers who are fully evaluated to make sure their blood is safe. This is blood bank blood. Transfusion therapy is the safest it has ever been in the practice of medicine. Before blood is taken from a donor, a complete history is taken to make sure that person has no history of diseases nor engages in risky social behavior (examples are intravenous drug use or sexual activity with multiple partners). The donor's travel history is screened to minimize risk of transmitting infections, such as malaria. The donated blood is tested for signs of infectious diseases, such as HIV and hepatitis. The blood is then tested to be sure it is compatible with you in order to minimize the chance of a transfusion reaction. If you or a relative donates blood, this is often done in anticipation of surgery and is not appropriate for emergency situations. It takes many days to process the donated blood. RISKS AND COMPLICATIONS Although transfusion therapy is very safe and saves many lives, the main dangers of transfusion include:   Getting an infectious disease.  Developing a transfusion reaction. This is an allergic reaction to something in the blood you were given. Every precaution is  taken to prevent this. The decision to have a blood transfusion has been considered carefully by your caregiver  before blood is given. Blood is not given unless the benefits outweigh the risks. AFTER THE TRANSFUSION  Right after receiving a blood transfusion, you will usually feel much better and more energetic. This is especially true if your red blood cells have gotten low (anemic). The transfusion raises the level of the red blood cells which carry oxygen, and this usually causes an energy increase.  The nurse administering the transfusion will monitor you carefully for complications. HOME CARE INSTRUCTIONS  No special instructions are needed after a transfusion. You may find your energy is better. Speak with your caregiver about any limitations on activity for underlying diseases you may have. SEEK MEDICAL CARE IF:   Your condition is not improving after your transfusion.  You develop redness or irritation at the intravenous (IV) site. SEEK IMMEDIATE MEDICAL CARE IF:  Any of the following symptoms occur over the next 12 hours:  Shaking chills.  You have a temperature by mouth above 102 F (38.9 C), not controlled by medicine.  Chest, back, or muscle pain.  People around you feel you are not acting correctly or are confused.  Shortness of breath or difficulty breathing.  Dizziness and fainting.  You get a rash or develop hives.  You have a decrease in urine output.  Your urine turns a dark color or changes to pink, red, or brown. Any of the following symptoms occur over the next 10 days:  You have a temperature by mouth above 102 F (38.9 C), not controlled by medicine.  Shortness of breath.  Weakness after normal activity.  The white part of the eye turns yellow (jaundice).  You have a decrease in the amount of urine or are urinating less often.  Your urine turns a dark color or changes to pink, red, or brown. Document Released: 07/23/2000 Document Revised: 10/18/2011 Document Reviewed: 03/11/2008 [facility] Patient Information 2014 [place],  [place].  _______________________________________________________________________

## 2015-07-16 NOTE — Telephone Encounter (Signed)
Not sure why she is on Plavix. Ask prescriber if okay

## 2015-07-16 NOTE — Pre-Procedure Instructions (Signed)
EKG 06-05-15, Echo 02-28-15 Epic. Clearance note Dr. Linard Millers , Dr, Earle Gell with chart.

## 2015-07-16 NOTE — Telephone Encounter (Signed)
Message fwd to [place] for approval to hold Plavix

## 2015-07-16 NOTE — Telephone Encounter (Signed)
New Prob   Request for surgical clearance:  1. What type of surgery is being performed? Total R knee   2. When is this surgery scheduled? 12/13   3. Are there any medications that need to be held prior to surgery and how long? Plavix, 5 days prior   4. Name of physician performing surgery? Dr. Alvan Dame   5. What is your office phone and fax number? Fax: [phone] Phone: [phone]   Pt has already received clearance from Dr. Tamala Julian but did not give indication regarding Plavix. Please call.

## 2015-07-17 NOTE — Telephone Encounter (Signed)
Returned call to [place] @ [place] her that Plavix was not prescribed by Dr.Smith, so he will not be able to give an ok to hold it. She will f/u with the pt

## 2015-07-22 ENCOUNTER — Inpatient Hospital Stay (HOSPITAL_COMMUNITY)
Admission: RE | Admit: 2015-07-22 | Discharge: 2015-07-24 | DRG: 470 | Disposition: A | Discharge status: Home Health | Payer: Medicare Other | Source: Ambulatory Visit | Attending: Orthopedic Surgery | Admitting: Orthopedic Surgery

## 2015-07-22 ENCOUNTER — Encounter (HOSPITAL_COMMUNITY): Payer: Self-pay | Admitting: *Deleted

## 2015-07-22 ENCOUNTER — Encounter (HOSPITAL_COMMUNITY)
Admission: RE | Disposition: A | Discharge status: Home Health | Payer: Self-pay | Source: Ambulatory Visit | Attending: Orthopedic Surgery

## 2015-07-22 ENCOUNTER — Inpatient Hospital Stay (HOSPITAL_COMMUNITY): Payer: Medicare Other | Admitting: Anesthesiology

## 2015-07-22 DIAGNOSIS — Z8673 Personal history of transient ischemic attack (TIA), and cerebral infarction without residual deficits: Secondary | ICD-10-CM

## 2015-07-22 DIAGNOSIS — M659 Synovitis and tenosynovitis, unspecified: Secondary | ICD-10-CM | POA: Diagnosis present

## 2015-07-22 DIAGNOSIS — Z7902 Long term (current) use of antithrombotics/antiplatelets: Secondary | ICD-10-CM

## 2015-07-22 DIAGNOSIS — Z96652 Presence of left artificial knee joint: Secondary | ICD-10-CM | POA: Diagnosis present

## 2015-07-22 DIAGNOSIS — K219 Gastro-esophageal reflux disease without esophagitis: Secondary | ICD-10-CM | POA: Diagnosis present

## 2015-07-22 DIAGNOSIS — E039 Hypothyroidism, unspecified: Secondary | ICD-10-CM | POA: Diagnosis present

## 2015-07-22 DIAGNOSIS — Z01812 Encounter for preprocedural laboratory examination: Secondary | ICD-10-CM | POA: Diagnosis not present

## 2015-07-22 DIAGNOSIS — I10 Essential (primary) hypertension: Secondary | ICD-10-CM | POA: Diagnosis present

## 2015-07-22 DIAGNOSIS — I5032 Chronic diastolic (congestive) heart failure: Secondary | ICD-10-CM | POA: Diagnosis present

## 2015-07-22 DIAGNOSIS — M1711 Unilateral primary osteoarthritis, right knee: Secondary | ICD-10-CM | POA: Diagnosis present

## 2015-07-22 DIAGNOSIS — Z96651 Presence of right artificial knee joint: Secondary | ICD-10-CM

## 2015-07-22 DIAGNOSIS — I428 Other cardiomyopathies: Secondary | ICD-10-CM | POA: Diagnosis present

## 2015-07-22 DIAGNOSIS — Z96659 Presence of unspecified artificial knee joint: Secondary | ICD-10-CM

## 2015-07-22 DIAGNOSIS — I252 Old myocardial infarction: Secondary | ICD-10-CM | POA: Diagnosis not present

## 2015-07-22 DIAGNOSIS — M25561 Pain in right knee: Secondary | ICD-10-CM | POA: Diagnosis present

## 2015-07-22 HISTORY — PX: TOTAL KNEE ARTHROPLASTY: SHX125

## 2015-07-22 LAB — TYPE AND SCREEN
ABO/RH(D): O POS
ANTIBODY SCREEN: NEGATIVE

## 2015-07-22 SURGERY — ARTHROPLASTY, KNEE, TOTAL
Anesthesia: Spinal | Site: Knee | Laterality: Right

## 2015-07-22 MED ORDER — KETOROLAC TROMETHAMINE 30 MG/ML IJ SOLN
INTRAMUSCULAR | Status: AC
  Filled 2015-07-22: qty 1

## 2015-07-22 MED ORDER — HYDROCODONE-ACETAMINOPHEN 7.5-325 MG PO TABS
1.0000 | ORAL_TABLET | ORAL | Status: DC
  Administered 2015-07-22 (×4): 1 via ORAL
  Administered 2015-07-23: 2 via ORAL
  Administered 2015-07-23 (×4): 1 via ORAL
  Administered 2015-07-23: 2 via ORAL
  Administered 2015-07-24 (×2): 1 via ORAL
  Filled 2015-07-22 (×13): qty 1
  Filled 2015-07-22: qty 2

## 2015-07-22 MED ORDER — CEFAZOLIN SODIUM-DEXTROSE 2-3 GM-% IV SOLR
2.0000 g | Freq: Four times a day (QID) | INTRAVENOUS | Status: AC
  Administered 2015-07-22 (×2): 2 g via INTRAVENOUS
  Filled 2015-07-22 (×2): qty 50

## 2015-07-22 MED ORDER — HYDROMORPHONE HCL 1 MG/ML IJ SOLN: 0.2500 mg | INTRAMUSCULAR | Status: DC | PRN

## 2015-07-22 MED ORDER — BUPIVACAINE-EPINEPHRINE (PF) 0.25% -1:200000 IJ SOLN
INTRAMUSCULAR | Status: DC | PRN
  Administered 2015-07-22: 30 mL

## 2015-07-22 MED ORDER — ASPIRIN EC 325 MG PO TBEC
325.0000 mg | DELAYED_RELEASE_TABLET | Freq: Every day | ORAL | Status: DC
  Administered 2015-07-23 – 2015-07-24 (×2): 325 mg via ORAL
  Filled 2015-07-22 (×3): qty 1

## 2015-07-22 MED ORDER — LORAZEPAM 1 MG PO TABS
1.0000 mg | ORAL_TABLET | Freq: Every day | ORAL | Status: DC
  Administered 2015-07-22 – 2015-07-23 (×2): 1 mg via ORAL
  Filled 2015-07-22 (×2): qty 1

## 2015-07-22 MED ORDER — CHLORHEXIDINE GLUCONATE 4 % EX LIQD
60.0000 mL | Freq: Once | CUTANEOUS | Status: DC
Start: 2015-07-22 — End: 2015-07-22

## 2015-07-22 MED ORDER — DIPHENHYDRAMINE HCL 25 MG PO CAPS: 25.0000 mg | ORAL_CAPSULE | Freq: Four times a day (QID) | ORAL | Status: DC | PRN

## 2015-07-22 MED ORDER — PHENOL 1.4 % MT LIQD
1.0000 | OROMUCOSAL | Status: DC | PRN
Start: 2015-07-22 — End: 2015-07-24
  Filled 2015-07-22: qty 177

## 2015-07-22 MED ORDER — BUPIVACAINE IN DEXTROSE 0.75-8.25 % IT SOLN
INTRATHECAL | Status: DC | PRN
  Administered 2015-07-22: 1.8 mL via INTRATHECAL

## 2015-07-22 MED ORDER — ESCITALOPRAM OXALATE 10 MG PO TABS
10.0000 mg | ORAL_TABLET | Freq: Every day | ORAL | Status: DC
  Administered 2015-07-23 – 2015-07-24 (×2): 10 mg via ORAL
  Filled 2015-07-22 (×2): qty 1

## 2015-07-22 MED ORDER — MIDAZOLAM HCL 2 MG/2ML IJ SOLN
INTRAMUSCULAR | Status: AC
  Filled 2015-07-22: qty 2

## 2015-07-22 MED ORDER — SODIUM CHLORIDE 0.9 % IJ SOLN
INTRAMUSCULAR | Status: AC
  Filled 2015-07-22: qty 50

## 2015-07-22 MED ORDER — MORPHINE SULFATE (PF) 2 MG/ML IV SOLN: 1.0000 mg | INTRAVENOUS | Status: DC | PRN

## 2015-07-22 MED ORDER — ALUM & MAG HYDROXIDE-SIMETH 200-200-20 MG/5ML PO SUSP: 30.0000 mL | ORAL | Status: DC | PRN

## 2015-07-22 MED ORDER — EPHEDRINE SULFATE 50 MG/ML IJ SOLN
INTRAMUSCULAR | Status: DC | PRN
  Administered 2015-07-22: 5 mg via INTRAVENOUS

## 2015-07-22 MED ORDER — KETOROLAC TROMETHAMINE 30 MG/ML IJ SOLN
INTRAMUSCULAR | Status: DC | PRN
  Administered 2015-07-22: 30 mg

## 2015-07-22 MED ORDER — METOCLOPRAMIDE HCL 10 MG PO TABS: 5.0000 mg | ORAL_TABLET | Freq: Three times a day (TID) | ORAL | Status: DC | PRN

## 2015-07-22 MED ORDER — BISACODYL 10 MG RE SUPP: 10.0000 mg | Freq: Every day | RECTAL | Status: DC | PRN

## 2015-07-22 MED ORDER — LIDOCAINE HCL (CARDIAC) 20 MG/ML IV SOLN
INTRAVENOUS | Status: AC
  Filled 2015-07-22: qty 5

## 2015-07-22 MED ORDER — ONDANSETRON HCL 4 MG/2ML IJ SOLN: 4.0000 mg | Freq: Four times a day (QID) | INTRAMUSCULAR | Status: DC | PRN

## 2015-07-22 MED ORDER — PROPOFOL 10 MG/ML IV BOLUS
INTRAVENOUS | Status: AC
  Filled 2015-07-22: qty 20

## 2015-07-22 MED ORDER — CEFAZOLIN SODIUM-DEXTROSE 2-3 GM-% IV SOLR
2.0000 g | INTRAVENOUS | Status: AC
  Administered 2015-07-22: 2 g via INTRAVENOUS

## 2015-07-22 MED ORDER — CLOPIDOGREL BISULFATE 75 MG PO TABS
75.0000 mg | ORAL_TABLET | Freq: Every day | ORAL | Status: DC
  Administered 2015-07-23 – 2015-07-24 (×2): 75 mg via ORAL
  Filled 2015-07-22 (×3): qty 1

## 2015-07-22 MED ORDER — METOPROLOL SUCCINATE ER 50 MG PO TB24
50.0000 mg | ORAL_TABLET | Freq: Every morning | ORAL | Status: DC
  Administered 2015-07-23 – 2015-07-24 (×2): 50 mg via ORAL
  Filled 2015-07-22 (×2): qty 1

## 2015-07-22 MED ORDER — SODIUM CHLORIDE 0.9 % IV SOLN
INTRAVENOUS | Status: DC
  Administered 2015-07-22 – 2015-07-23 (×2): via INTRAVENOUS
  Filled 2015-07-22 (×6): qty 1000

## 2015-07-22 MED ORDER — DOCUSATE SODIUM 100 MG PO CAPS
100.0000 mg | ORAL_CAPSULE | Freq: Two times a day (BID) | ORAL | Status: DC
  Administered 2015-07-22 – 2015-07-24 (×4): 100 mg via ORAL

## 2015-07-22 MED ORDER — METHOCARBAMOL 500 MG PO TABS
500.0000 mg | ORAL_TABLET | Freq: Four times a day (QID) | ORAL | Status: DC | PRN
  Administered 2015-07-22 – 2015-07-24 (×4): 500 mg via ORAL
  Filled 2015-07-22 (×4): qty 1

## 2015-07-22 MED ORDER — MIDAZOLAM HCL 5 MG/5ML IJ SOLN
INTRAMUSCULAR | Status: DC | PRN
  Administered 2015-07-22: 1 mg via INTRAVENOUS

## 2015-07-22 MED ORDER — DEXAMETHASONE SODIUM PHOSPHATE 10 MG/ML IJ SOLN
10.0000 mg | Freq: Once | INTRAMUSCULAR | Status: AC
  Administered 2015-07-22: 10 mg via INTRAVENOUS

## 2015-07-22 MED ORDER — LEVOTHYROXINE SODIUM 75 MCG PO TABS
75.0000 ug | ORAL_TABLET | Freq: Every day | ORAL | Status: DC
  Administered 2015-07-23 – 2015-07-24 (×2): 75 ug via ORAL
  Filled 2015-07-22 (×4): qty 1

## 2015-07-22 MED ORDER — POLYETHYLENE GLYCOL 3350 17 G PO PACK
17.0000 g | PACK | Freq: Two times a day (BID) | ORAL | Status: DC
  Administered 2015-07-23 – 2015-07-24 (×3): 17 g via ORAL

## 2015-07-22 MED ORDER — FENTANYL CITRATE (PF) 100 MCG/2ML IJ SOLN
INTRAMUSCULAR | Status: AC
  Filled 2015-07-22: qty 2

## 2015-07-22 MED ORDER — MAGNESIUM CITRATE PO SOLN: 1.0000 | Freq: Once | ORAL | Status: DC | PRN

## 2015-07-22 MED ORDER — BUPIVACAINE-EPINEPHRINE (PF) 0.25% -1:200000 IJ SOLN
INTRAMUSCULAR | Status: AC
Start: 2015-07-22 — End: 2015-07-22
  Filled 2015-07-22: qty 30

## 2015-07-22 MED ORDER — DEXAMETHASONE SODIUM PHOSPHATE 10 MG/ML IJ SOLN
INTRAMUSCULAR | Status: AC
  Filled 2015-07-22: qty 1

## 2015-07-22 MED ORDER — DEXAMETHASONE SODIUM PHOSPHATE 10 MG/ML IJ SOLN: 10.0000 mg | Freq: Once | INTRAMUSCULAR | Status: DC

## 2015-07-22 MED ORDER — MENTHOL 3 MG MT LOZG: 1.0000 | LOZENGE | OROMUCOSAL | Status: DC | PRN

## 2015-07-22 MED ORDER — CEFAZOLIN SODIUM-DEXTROSE 2-3 GM-% IV SOLR
INTRAVENOUS | Status: AC
  Filled 2015-07-22: qty 50

## 2015-07-22 MED ORDER — LACTATED RINGERS IV SOLN
INTRAVENOUS | Status: DC
  Administered 2015-07-22: 11:00:00 via INTRAVENOUS
  Administered 2015-07-22: 1000 mL via INTRAVENOUS

## 2015-07-22 MED ORDER — FENTANYL CITRATE (PF) 100 MCG/2ML IJ SOLN
INTRAMUSCULAR | Status: DC | PRN
  Administered 2015-07-22: 100 ug via INTRAVENOUS

## 2015-07-22 MED ORDER — PROPOFOL 500 MG/50ML IV EMUL
INTRAVENOUS | Status: DC | PRN
  Administered 2015-07-22: 50 ug/kg/min via INTRAVENOUS

## 2015-07-22 MED ORDER — TRANEXAMIC ACID 1000 MG/10ML IV SOLN
1000.0000 mg | Freq: Once | INTRAVENOUS | Status: AC
  Administered 2015-07-22: 1000 mg via INTRAVENOUS
  Filled 2015-07-22: qty 10

## 2015-07-22 MED ORDER — METHOCARBAMOL 1000 MG/10ML IJ SOLN
500.0000 mg | Freq: Four times a day (QID) | INTRAVENOUS | Status: DC | PRN
  Administered 2015-07-23: 500 mg via INTRAVENOUS
  Filled 2015-07-22 (×2): qty 5

## 2015-07-22 MED ORDER — FUROSEMIDE 20 MG PO TABS
20.0000 mg | ORAL_TABLET | Freq: Every morning | ORAL | Status: DC
  Administered 2015-07-22 – 2015-07-24 (×3): 20 mg via ORAL
  Filled 2015-07-22 (×3): qty 1

## 2015-07-22 MED ORDER — SODIUM CHLORIDE 0.9 % IJ SOLN
INTRAMUSCULAR | Status: DC | PRN
  Administered 2015-07-22: 30 mL

## 2015-07-22 MED ORDER — ONDANSETRON HCL 4 MG PO TABS: 4.0000 mg | ORAL_TABLET | Freq: Four times a day (QID) | ORAL | Status: DC | PRN

## 2015-07-22 MED ORDER — EPHEDRINE SULFATE 50 MG/ML IJ SOLN
INTRAMUSCULAR | Status: AC
  Filled 2015-07-22: qty 1

## 2015-07-22 MED ORDER — METOCLOPRAMIDE HCL 5 MG/ML IJ SOLN: 5.0000 mg | Freq: Three times a day (TID) | INTRAMUSCULAR | Status: DC | PRN

## 2015-07-22 MED ORDER — FERROUS SULFATE 325 (65 FE) MG PO TABS
325.0000 mg | ORAL_TABLET | Freq: Three times a day (TID) | ORAL | Status: DC
  Administered 2015-07-24: 325 mg via ORAL
  Filled 2015-07-22 (×9): qty 1

## 2015-07-22 MED ORDER — PROMETHAZINE HCL 25 MG/ML IJ SOLN: 6.2500 mg | INTRAMUSCULAR | Status: DC | PRN

## 2015-07-22 SURGICAL SUPPLY — 45 items
BAG DECANTER FOR FLEXI CONT (MISCELLANEOUS) IMPLANT
BAG SPEC THK2 15X12 ZIP CLS (MISCELLANEOUS)
BAG ZIPLOCK 12X15 (MISCELLANEOUS) IMPLANT
BANDAGE ELASTIC 6 VELCRO ST LF (GAUZE/BANDAGES/DRESSINGS) ×3 IMPLANT
BLADE SAW SGTL 13.0X1.19X90.0M (BLADE) ×3 IMPLANT
BOWL SMART MIX CTS (DISPOSABLE) ×3 IMPLANT
CAPT KNEE TOTAL 3 ATTUNE ×2 IMPLANT
CEMENT HV SMART SET (Cement) ×4 IMPLANT
CLOTH BEACON ORANGE TIMEOUT ST (SAFETY) ×3 IMPLANT
CUFF TOURN SGL QUICK 34 (TOURNIQUET CUFF) ×3
CUFF TRNQT CYL 34X4X40X1 (TOURNIQUET CUFF) ×1 IMPLANT
DECANTER SPIKE VIAL GLASS SM (MISCELLANEOUS) ×3 IMPLANT
DRAPE U-SHAPE 47X51 STRL (DRAPES) ×3 IMPLANT
DRSG AQUACEL AG ADV 3.5X10 (GAUZE/BANDAGES/DRESSINGS) ×3 IMPLANT
DURAPREP 26ML APPLICATOR (WOUND CARE) ×6 IMPLANT
ELECT REM PT RETURN 9FT ADLT (ELECTROSURGICAL) ×3
ELECTRODE REM PT RTRN 9FT ADLT (ELECTROSURGICAL) ×1 IMPLANT
GLOVE BIOGEL M 7.0 STRL (GLOVE) IMPLANT
GLOVE BIOGEL PI IND STRL 7.5 (GLOVE) ×1 IMPLANT
GLOVE BIOGEL PI IND STRL 8.5 (GLOVE) ×1 IMPLANT
GLOVE BIOGEL PI INDICATOR 7.5 (GLOVE) ×2
GLOVE BIOGEL PI INDICATOR 8.5 (GLOVE) ×2
GLOVE ECLIPSE 8.0 STRL XLNG CF (GLOVE) ×3 IMPLANT
GLOVE ORTHO TXT STRL SZ7.5 (GLOVE) ×6 IMPLANT
GOWN STRL REUS W/TWL LRG LVL3 (GOWN DISPOSABLE) ×3 IMPLANT
GOWN STRL REUS W/TWL XL LVL3 (GOWN DISPOSABLE) ×3 IMPLANT
HANDPIECE INTERPULSE COAX TIP (DISPOSABLE) ×3
LIQUID BAND (GAUZE/BANDAGES/DRESSINGS) ×3 IMPLANT
MANIFOLD NEPTUNE II (INSTRUMENTS) ×3 IMPLANT
PACK TOTAL KNEE CUSTOM (KITS) ×3 IMPLANT
POSITIONER SURGICAL ARM (MISCELLANEOUS) ×3 IMPLANT
SET HNDPC FAN SPRY TIP SCT (DISPOSABLE) ×1 IMPLANT
SET PAD KNEE POSITIONER (MISCELLANEOUS) ×3 IMPLANT
SUCTION FRAZIER 12FR DISP (SUCTIONS) ×3 IMPLANT
SUT MNCRL AB 4-0 PS2 18 (SUTURE) ×3 IMPLANT
SUT VIC AB 1 CT1 36 (SUTURE) ×3 IMPLANT
SUT VIC AB 2-0 CT1 27 (SUTURE) ×9
SUT VIC AB 2-0 CT1 TAPERPNT 27 (SUTURE) ×3 IMPLANT
SUT VLOC 180 0 24IN GS25 (SUTURE) ×3 IMPLANT
SYR 50ML LL SCALE MARK (SYRINGE) ×1 IMPLANT
TRAY FOLEY W/METER SILVER 14FR (SET/KITS/TRAYS/PACK) ×3 IMPLANT
TRAY FOLEY W/METER SILVER 16FR (SET/KITS/TRAYS/PACK) ×1 IMPLANT
WATER STERILE IRR 1500ML POUR (IV SOLUTION) ×3 IMPLANT
WRAP KNEE MAXI GEL POST OP (GAUZE/BANDAGES/DRESSINGS) ×3 IMPLANT
YANKAUER SUCT BULB TIP 10FT TU (MISCELLANEOUS) ×3 IMPLANT

## 2015-07-22 NOTE — Anesthesia Postprocedure Evaluation (Signed)
Anesthesia Post Note  Patient: Leslie [name]  Procedure(s) Performed: Procedure(s) (LRB): RIGHT TOTAL KNEE ARTHROPLASTY (Right)  Patient location during evaluation: PACU Anesthesia Type: Spinal Level of consciousness: oriented and awake and alert Pain management: pain level controlled Vital Signs Assessment: post-procedure vital signs reviewed and stable Respiratory status: spontaneous breathing, respiratory function stable and patient connected to nasal cannula oxygen Cardiovascular status: blood pressure returned to baseline and stable Postop Assessment: no headache and no backache Anesthetic complications: no    Last Vitals:  Filed Vitals:   07/22/15 1515 07/22/15 1615  BP: 145/52 159/53  [name]: 58 53  Temp: 36.5 C 36.3 C  Resp: 16 15    Last Pain:  Filed Vitals:   07/22/15 1627  PainSc: 2                  Tyquisha Sharps J

## 2015-07-22 NOTE — Progress Notes (Signed)
Leslie [name] in [place] aware pt will be in 1602 in 20 minutes.

## 2015-07-22 NOTE — Evaluation (Signed)
Physical Therapy Evaluation Patient Details Name: Leslie [name] MRN: [id number] DOB: 1930/12/13 Today's Date: 07/22/2015   History of Present Illness  81 y.o. female with h/o L TKA 01/21/15, LBBB, CVA, MI, HF, HTN, lumbar laminectomy admitted for R TKA 07/22/15.  Clinical Impression  Pt is s/p TKA resulting in the deficits listed below (see PT Problem List). Pt ambulated 25' with RW and min/guard assist. Performed TKA exercises with min A.  Good progress expected.  Pt will benefit from skilled PT to increase their independence and safety with mobility to allow discharge to the venue listed below.      Follow Up Recommendations Home health PT    Equipment Recommendations  None recommended by PT    Recommendations for Other Services       Precautions / Restrictions Precautions Precautions: Fall;Knee Restrictions Weight Bearing Restrictions: No Other Position/Activity Restrictions: WBAT      Mobility  Bed Mobility Overal bed mobility: Needs Assistance Bed Mobility: Supine to Sit     Supine to sit: Min assist     General bed mobility comments: min A to support RLE  Transfers Overall transfer level: Needs assistance Equipment used: Rolling walker (2 wheeled) Transfers: Sit to/from Stand Sit to Stand: Min guard         General transfer comment: verbal cues hand placement  Ambulation/Gait Ambulation/Gait assistance: Min guard Ambulation Distance (Feet): 25 Feet Assistive device: Rolling walker (2 wheeled) Gait Pattern/deviations: Step-to pattern   Gait velocity interpretation: Below normal speed for age/gender General Gait Details: steady with RW, verbal cues sequencing  Stairs            Wheelchair Mobility    Modified Rankin (Stroke Patients Only)       Balance Overall balance assessment: Modified Independent                                           Pertinent Vitals/Pain Pain Assessment: 0-10 Pain Score: 5  Pain Location:  R knee with activity Pain Descriptors / Indicators: Sore Pain Intervention(s): Premedicated before session;Monitored during session;Limited activity within patient's tolerance;Ice applied    Home Living Family/patient expects to be discharged to:: Private residence Living Arrangements: Alone Available Help at Discharge: Family Type of Home: House Home Access: Stairs to enter Entrance Stairs-Rails: Left Entrance Stairs-Number of Steps: 3.. 1 step to [employer] Layout: One level Home Equipment: Environmental consultant - 2 wheels;Cane - single point;Shower seat      Prior Function Level of Independence: Independent               Hand Dominance        Extremity/Trunk Assessment   Upper Extremity Assessment: Overall WFL for tasks assessed           Lower Extremity Assessment: RLE deficits/detail RLE Deficits / Details: 0-45* AAROM R knee, knee ext -3/5, SLR -3/5, ankle WNL    Cervical / Trunk Assessment: Normal  Communication   Communication: No difficulties  Cognition Arousal/Alertness: Awake/alert Behavior During Therapy: WFL for tasks assessed/performed Overall Cognitive Status: Within Functional Limits for tasks assessed                      General Comments      Exercises Total Joint Exercises Ankle Circles/Pumps: AROM;Both;10 reps;Supine Quad Sets: AROM;Both;10 reps;Supine Heel Slides: AAROM;Right;10 reps;Supine Long Arc Quad: AROM;Right;5 reps;Seated Goniometric ROM: 0-45* AAROM R  knee      Assessment/Plan    PT Assessment Patient needs continued PT services  PT Diagnosis Difficulty walking;Acute pain   PT Problem List Decreased strength;Decreased range of motion;Decreased activity tolerance;Pain;Decreased mobility  PT Treatment Interventions Gait training;Functional mobility training;Stair training;Patient/family education;Therapeutic activities;Therapeutic exercise   PT Goals (Current goals can be found in the Care Plan section) Acute Rehab PT  Goals Patient Stated Goal: return to independence PT Goal Formulation: With patient/family Time For Goal Achievement: 08/05/15 Potential to Achieve Goals: Good    Frequency 7X/week   Barriers to discharge        Co-evaluation               End of Session Equipment Utilized During Treatment: Gait belt Activity Tolerance: Patient tolerated treatment well Patient left: in chair;with call bell/phone within reach;with chair alarm set;with family/visitor present Nurse Communication: Mobility status         Time: [id number] PT Time Calculation (min) (ACUTE ONLY): 18 min   Charges:   PT Evaluation $Initial PT Evaluation Tier I: 1 Procedure     PT G CodesPhilomena Doheny 07/22/2015, 3:28 PM [phone]

## 2015-07-22 NOTE — Anesthesia Procedure Notes (Signed)
Spinal Patient location during procedure: OR Start time: 07/22/2015 10:22 AM End time: 07/22/2015 10:25 AM Staffing Resident/CRNA: Alfonso Patten J Performed by: resident/CRNA  Preanesthetic Checklist Completed: patient identified, site marked, surgical consent, pre-op evaluation, timeout performed, IV checked, risks and benefits discussed and monitors and equipment checked Spinal Block Patient position: sitting Prep: Betadine Patient monitoring: heart rate, continuous pulse ox and blood pressure Approach: midline Location: L3-4 Injection technique: single-shot Needle Needle type: Sprotte  Needle gauge: 24 G Needle length: 9 cm Additional Notes Expiration date of kit checked and confirmed. Patient tolerated procedure well, without complications, one attempt.  (+) CSF    (-)Heme noted - easy aspiration of CSF.

## 2015-07-22 NOTE — Progress Notes (Signed)
Utilization review completed.  

## 2015-07-22 NOTE — Discharge Instructions (Signed)

## 2015-07-22 NOTE — Anesthesia Preprocedure Evaluation (Addendum)
Anesthesia Evaluation  Patient identified by MRN, date of birth, ID band Patient awake    Reviewed: Allergy & Precautions, NPO status , Patient's Chart, lab work & pertinent test results  History of Anesthesia Complications (+) history of anesthetic complications  Airway Mallampati: II  TM Distance: >3 FB Neck ROM: Full    Dental no notable dental hx.    Pulmonary shortness of breath,    Pulmonary exam normal breath sounds clear to auscultation       Cardiovascular Exercise Tolerance: Good hypertension, Pt. on medications and Pt. on home beta blockers + Past MI and +CHF  Normal cardiovascular exam+ dysrhythmias  Rhythm:Regular Rate:Normal  ECHO 02-28-15: EF 55-60%   Neuro/Psych  Headaches, PSYCHIATRIC DISORDERS Anxiety Depression CVA    GI/Hepatic Neg liver ROS, GERD  ,  Endo/Other  Hypothyroidism   Renal/GU negative Renal ROS  negative genitourinary   Musculoskeletal  (+) Arthritis ,   Abdominal   Peds negative pediatric ROS (+)  Hematology  (+) anemia ,   Anesthesia Other Findings   Reproductive/Obstetrics negative OB ROS                            Anesthesia Physical Anesthesia Plan  ASA: III  Anesthesia Plan: Spinal   Post-op Pain Management:    Induction: Intravenous  Airway Management Planned: Natural Airway  Additional Equipment:   Intra-op Plan:   Post-operative Plan:   Informed Consent: I have reviewed the patients History and Physical, chart, labs and discussed the procedure including the risks, benefits and alternatives for the proposed anesthesia with the patient or authorized representative who has indicated his/her understanding and acceptance.   Dental advisory given  Plan Discussed with: CRNA  Anesthesia Plan Comments: (Discussed risks and benefits of and differences between spinal and general. Discussed risks of spinal including headache, backache,  failure, bleeding and hematoma, infection, and nerve damage. Patient consents to spinal. Questions answered. Coagulation studies and platelet count acceptable.  Off Plavix for 7 days and so a candidate for spinal. )       Anesthesia Quick Evaluation

## 2015-07-22 NOTE — Interval H&P Note (Signed)
History and Physical Interval Note:  07/22/2015 9:05 AM  Leslie [name]  has presented today for surgery, with the diagnosis of RIGHT KNEE OA  The various methods of treatment have been discussed with the patient and family. After consideration of risks, benefits and other options for treatment, the patient has consented to  Procedure(s): TOTAL KNEE ARTHROPLASTY (Right) as a surgical intervention .  The patient's history has been reviewed, patient examined, no change in status, stable for surgery.  I have reviewed the patient's chart and labs.  Questions were answered to the patient's satisfaction.     Mauri Pole

## 2015-07-22 NOTE — Transfer of Care (Signed)
Immediate Anesthesia Transfer of Care Note  Patient: Leslie [name]  Procedure(s) Performed: Procedure(s): RIGHT TOTAL KNEE ARTHROPLASTY (Right)  Patient Location: PACU  Anesthesia Type:SAB  Level of Consciousness: awake, alert  and oriented  Airway & Oxygen Therapy: Patient Spontanous Breathing and Patient connected to face mask oxygen  Post-op Assessment: Report given to RN and Post -op Vital signs reviewed and stable  Post vital signs: Reviewed and stable  Last Vitals:  Filed Vitals:   07/22/15 0754  BP: 147/60  [name]: 59  Temp: 36.4 C  Resp: 16    Complications: No apparent anesthesia complications

## 2015-07-22 NOTE — Op Note (Signed)
NAME:  Leslie [name] RECORD NO.:  [id number]                             FACILITY:  [facility]      PHYSICIAN:  Pietro Cassis. Alvan Dame, M.D.  DATE OF BIRTH:  [birth year]      DATE OF PROCEDURE:  07/22/2015                                     OPERATIVE REPORT         PREOPERATIVE DIAGNOSIS:  Right knee osteoarthritis.      POSTOPERATIVE DIAGNOSIS:  Right knee osteoarthritis.      FINDINGS:  The patient was noted to have complete loss of cartilage and   bone-on-bone arthritis with associated osteophytes in all three compartments of   the knee worse in the lateral compartment with a significant synovitis and associated effusion.      PROCEDURE:  Right total knee replacement.      COMPONENTS USED:  DePuy Attune rotating platform posterior stabilized knee   system, a size 7 femur, 6 tibia, 6 mm AOX PS insert, and 35 anatomic patellar   button.      SURGEON:  Pietro Cassis. Alvan Dame, M.D.      ASSISTANT:  Danae Orleans, PA-C.      ANESTHESIA:  Spinal.      SPECIMENS:  None.      COMPLICATION:  None.      DRAINS:  None  EBL: <50cc      TOURNIQUET TIME:   Total Tourniquet Time Documented: Thigh (Right) - 27 minutes Total: Thigh (Right) - 27 minutes      The patient was stable to the recovery room.      INDICATION FOR PROCEDURE:  Leslie [name] is a 81 y.o. female patient of   mine.  The patient had been seen, evaluated, and treated conservatively in the   office with medication, activity modification, and injections.  The patient had   radiographic changes of bone-on-bone arthritis with endplate sclerosis and osteophytes noted.      The patient failed conservative measures including medication, injections, and activity modification, and at this point was ready for more definitive measures.   Based on the radiographic changes and failed conservative measures, the patient   decided to proceed with total knee replacement.  Risks of infection,   DVT, component  failure, need for revision surgery, postop course, and   expectations were all   discussed and reviewed.  Consent was obtained for benefit of pain   relief.      PROCEDURE IN DETAIL:  The patient was brought to the operative theater.   Once adequate anesthesia, preoperative antibiotics, 2 gm of Ancef, 1 gm of Tranexamic Acid, and 10 mg of Decadron administered, the patient was positioned supine with the right thigh tourniquet placed.  The  right lower extremity was prepped and draped in sterile fashion.  A time-   out was performed identifying the patient, planned procedure, and   extremity.      The right lower extremity was placed in the [facility] leg holder.  The leg was   exsanguinated, tourniquet elevated to 250 mmHg.  A midline incision was   made  followed by median parapatellar arthrotomy.  Following initial   exposure, attention was first directed to the patella.  Precut   measurement was noted to be 21 mm.  I resected down to 13-14 mm and used a   35 patellar button to restore patellar height as well as cover the cut   surface.      The lug holes were drilled and a metal shim was placed to protect the   patella from retractors and saw blades.      At this point, attention was now directed to the femur.  The femoral   canal was opened with a drill, irrigated to try to prevent fat emboli.  An   intramedullary rod was passed at 3 degrees valgus, 9 mm of bone was   resected off the distal femur.  Following this resection, the tibia was   subluxated anteriorly.  Using the extramedullary guide, 2 mm of bone was resected off   the proximal lateral tibia.  We confirmed the gap would be   stable medially and laterally with a size 5 mm insert as well as confirmed   the cut was perpendicular in the coronal plane, checking with an alignment rod.      Once this was done, I sized the femur to be a size 7 in the anterior-   posterior dimension, chose a standard component based on medial and    lateral dimension.  The size 7 rotation block was then pinned in   position anterior referenced using the C-clamp to set rotation.  The   anterior, posterior, and  chamfer cuts were made without difficulty nor   notching making certain that I was along the anterior cortex to help   with flexion gap stability.      The final box cut was made off the lateral aspect of distal femur.      At this point, the tibia was sized to be a size 6, the size 6 tray was   then pinned in position through the medial third of the tubercle,   drilled, and keel punched.  Trial reduction was now carried with a 7 femur,  6 tibia, a size 5 then 6 mm insert, and the 35 patella botton.  The knee was brought to   extension, full extension with good flexion stability with the patella   tracking through the trochlea without application of pressure.  Femoral lug holes were drilled. Given   all these findings, the trial components removed.  Final components were   opened and cement was mixed.  The knee was irrigated with normal saline   solution and pulse lavage.  The synovial lining was   then injected with 30cc of 0.25% Marcaine with epinephrine and 1 cc of Toradol plus 30 cc of NS for a    total of 61 cc.      The knee was irrigated.  Final implants were then cemented onto clean and   dried cut surfaces of bone with the knee brought to extension with a size 6 mm trial insert.      Once the cement had fully cured, the excess cement was removed   throughout the knee.  I confirmed I was satisfied with the range of   motion and stability, and the final size 6 mm AOX PS insert was chosen.  It was   placed into the knee.      The tourniquet had been let down at 27 minutes.  No  significant   hemostasis required.  The   extensor mechanism was then reapproximated using #1 Vicryl and #0 V-lock sutures with the knee   in flexion.  The   remaining wound was closed with 2-0 Vicryl and running 4-0 Monocryl.   The knee was  cleaned, dried, dressed sterilely using Dermabond and   Aquacel dressing.  The patient was then   brought to recovery room in stable condition, tolerating the procedure   well.   Please note that Physician Assistant, Danae Orleans, PA-C, was present for the entirety of the case, and was utilized for pre-operative positioning, peri-operative retractor management, general facilitation of the procedure.  He was also utilized for primary wound closure at the end of the case.              Pietro Cassis Alvan Dame, M.D.    07/22/2015 11:53 AM

## 2015-07-23 LAB — CBC
HCT: 28.9 % — ABNORMAL LOW (ref 36.0–46.0)
Hemoglobin: 9.2 g/dL — ABNORMAL LOW (ref 12.0–15.0)
MCH: 29.1 pg (ref 26.0–34.0)
MCHC: 31.8 g/dL (ref 30.0–36.0)
MCV: 91.5 fL (ref 78.0–100.0)
PLATELETS: 172 10*3/uL (ref 150–400)
RBC: 3.16 MIL/uL — ABNORMAL LOW (ref 3.87–5.11)
RDW: 14 % (ref 11.5–15.5)
WBC: 10.2 10*3/uL (ref 4.0–10.5)

## 2015-07-23 LAB — BASIC METABOLIC PANEL
Anion gap: 5 (ref 5–15)
BUN: 25 mg/dL — AB (ref 6–20)
CO2: 27 mmol/L (ref 22–32)
CREATININE: 1.04 mg/dL — AB (ref 0.44–1.00)
Calcium: 8.4 mg/dL — ABNORMAL LOW (ref 8.9–10.3)
Chloride: 108 mmol/L (ref 101–111)
GFR calc Af Amer: 56 mL/min — ABNORMAL LOW (ref >60)
GFR, EST NON AFRICAN AMERICAN: 49 mL/min — AB (ref >60)
GLUCOSE: 126 mg/dL — AB (ref 65–99)
Potassium: 5.1 mmol/L (ref 3.5–5.1)
SODIUM: 140 mmol/L (ref 135–145)

## 2015-07-23 NOTE — Progress Notes (Signed)
Physical Therapy Treatment Patient Details Name: DUANE DEBOISE MRN: [id number] DOB: Apr 22, 1931 Today's Date: 07/23/2015    History of Present Illness 81 y.o. female with h/o L TKA 01/21/15, LBBB, CVA, MI, HF, HTN, lumbar laminectomy admitted for R TKA 07/22/15.    PT Comments    Patient is progressing, Knee is  Flexed somewhat. Encouraged to extend. Plans for DC tomorrow.  Follow Up Recommendations  Home health PT     Equipment Recommendations  None recommended by PT    Recommendations for Other Services       Precautions / Restrictions Precautions Precautions: Fall;Knee    Mobility  Bed Mobility   Bed Mobility: Supine to Sit;Sit to Supine     Supine to sit: Min guard Sit to supine: Min assist   General bed mobility comments: assist with R leg onto bed  Transfers Overall transfer level: Needs assistance Equipment used: Rolling walker (2 wheeled) Transfers: Sit to/from Stand           General transfer comment: supervision for safety, no cueing needed for hand placement or sequencing.   Ambulation/Gait Ambulation/Gait assistance: Min guard Ambulation Distance (Feet): 150 Feet Assistive device: Rolling walker (2 wheeled) Gait Pattern/deviations: Decreased stance time - right;Step-to pattern;Step-through pattern Gait velocity: decr,   General Gait Details: R knee with tendency to have  knee flexion during ambulation   Stairs            Wheelchair Mobility    Modified Rankin (Stroke Patients Only)       Balance                                    Cognition Arousal/Alertness: Awake/alert                          Exercises Total Joint Exercises Ankle Circles/Pumps: AROM;Both;10 reps;Supine Quad Sets: AROM;Both;10 reps;Supine Towel Squeeze: AROM;Both;10 reps;Supine Short Arc Quad: AAROM;Right;10 reps;Supine Heel Slides: AAROM;Right;10 reps;Supine Hip ABduction/ADduction: AAROM;Right;10 reps;Supine Straight Leg  Raises: Right;10 reps;Supine;AROM Long Arc Quad: AROM;Right;5 reps;Seated Goniometric ROM: 0-60 r knee    General Comments        Pertinent Vitals/Pain Pain Score: 4  Faces Pain Scale: Hurts little more Pain Location: R knee Pain Descriptors / Indicators: Aching Pain Intervention(s): Monitored during session;Premedicated before session;Ice applied;Repositioned    Home Living                      Prior Function            PT Goals (current goals can now be found in the care plan section) Progress towards PT goals: Progressing toward goals    Frequency  7X/week    PT Plan Current plan remains appropriate    Co-evaluation             End of Session   Activity Tolerance: Patient tolerated treatment well Patient left: in bed;with call bell/phone within reach;with bed alarm set     Time: 1519-1535 PT Time Calculation (min) (ACUTE ONLY): 16 min  Charges:  $Gait Training: 8-22 mins $Therapeutic Exercise: 8-22 mins                    G Codes:      [name] 07/23/2015, 4:53 PM Tresa Endo PT [phone]

## 2015-07-23 NOTE — Care Management Note (Addendum)
Case Management Note  Patient Details  Name: CHADE MONACELLI MRN: [id number] Date of Birth: [birth year]  Subjective/Objective:  81 y.o. F admitted 07/22/2015 for R TKA.   S/p L TKA 01/2015. Has all DME.                 Action/Plan: Anticipate discharge home 07/24/2015 with HHPT provided by [facility]. No further CM needs but will be available should additional discharge needs arise.   Expected Discharge Date:                  Expected Discharge Plan:     In-House Referral:     Discharge planning Services  CM Consult  Post Acute Care Choice:    Choice offered to:  Patient  DME Arranged:    DME Agency:  [place]  HH Arranged:  PT [facility] Agency:  [place]  Status of Service:  Completed, signed off  Medicare Important Message Given:    Date Medicare IM Given:    Medicare IM give by:    Date Additional Medicare IM Given:    Additional Medicare Important Message give by:     If discussed at [place] of Stay Meetings, dates discussed:    Additional Comments:  Delrae Sawyers, RN 07/23/2015, 9:22 AM

## 2015-07-23 NOTE — Progress Notes (Signed)
     Subjective: 1 Day Post-Op Procedure(s) (LRB): RIGHT TOTAL KNEE ARTHROPLASTY (Right)   Patient reports pain as mild, pain controlled. No events throughout the night.  Objective:   VITALS:   Filed Vitals:   07/23/15 0115 07/23/15 0541  BP: 136/55 133/57  Pulse: 61 60  Temp: 97.8 F (36.6 C) 98.1 F (36.7 C)  Resp: 16 16    Dorsiflexion/Plantar flexion intact Incision: dressing C/D/I No cellulitis present Compartment soft  LABS  Recent Labs  07/23/15 0520  HGB 9.2*  HCT 28.9*  WBC 10.2  PLT 172     Recent Labs  07/23/15 0520  NA 140  K 5.1  BUN 25*  CREATININE 1.04*  GLUCOSE 126*     Assessment/Plan: 1 Day Post-Op Procedure(s) (LRB): RIGHT TOTAL KNEE ARTHROPLASTY (Right) Foley cath d/c'ed Advance diet Up with therapy D/C IV fluids Discharge home with home health eventually, when ready      [place] P[place]. Brinlyn Cena   PAC  07/23/2015, 8:55 AM

## 2015-07-23 NOTE — Evaluation (Signed)
Occupational Therapy Evaluation Patient Details Name: Leslie [name] MRN: [id number] DOB: 07/27/1931 Today's Date: 07/23/2015    History of Present Illness 81 y.o. female with h/o L TKA 01/21/15, LBBB, CVA, MI, HF, HTN, lumbar laminectomy admitted for R TKA 07/22/15.   Clinical Impression   Patient admitted with above. Patient independent to mod I PTA. Patient currently functioning at an overall supervision level. Patient with right TKA ~6 months ago and patient exhibits good carryover from that surgery and good overall safety awareness for ADLs and functional mobility using RW. No additional OT needs identified, D/C from acute OT services and no additional follow-up OT needs at this time. All appropriate education provided to patient. Please re-order OT if needed.      Follow Up Recommendations  No OT follow up;Supervision - Intermittent    Equipment Recommendations  None recommended by OT    Recommendations for Other Services   None at this time  Precautions / Restrictions Precautions Precautions: Fall;Knee Restrictions Weight Bearing Restrictions: Yes RLE Weight Bearing: Weight bearing as tolerated    Mobility Bed Mobility General bed mobility comments: Pt found seated in recliner upon OT entering/exiting room  Transfers Overall transfer level: Needs assistance Equipment used: Rolling walker (2 wheeled) Transfers: Sit to/from Stand Sit to Stand: Supervision General transfer comment: supervision for safety, no cueing needed for hand placement or sequencing.     Balance Overall balance assessment: Modified Independent     ADL Overall ADL's : Needs assistance/impaired General ADL Comments: Pt overall supervision for ADLs and functional mobility. Min cueing needed for safety. Pt able to reach BLEs for LB ADLs. Pt ambulated into BR for toilet transfer using BSC and walk-in shower transfer (both simulated like at home).     Pertinent Vitals/Pain Pain Assessment:  Faces Faces Pain Scale: Hurts little more Pain Location: R knee with activity/movement  Pain Descriptors / Indicators: Sore Pain Intervention(s): Monitored during session;Repositioned;Ice applied     Hand Dominance Right   Extremity/Trunk Assessment Upper Extremity Assessment Upper Extremity Assessment: Overall WFL for tasks assessed   Lower Extremity Assessment Lower Extremity Assessment: Defer to PT evaluation   Cervical / Trunk Assessment Cervical / Trunk Assessment: Normal   Communication Communication Communication: No difficulties   Cognition Arousal/Alertness: Awake/alert Behavior During Therapy: WFL for tasks assessed/performed Overall Cognitive Status: Within Functional Limits for tasks assessed             Home Living Family/patient expects to be discharged to:: Private residence Living Arrangements: Alone Available Help at Discharge: Family;Personal care attendant;Available 24 hours/day Type of Home: House Home Access: Stairs to enter [employer] of Steps: 3.. 1 step to [employer]: Left Home Layout: One level     Bathroom Shower/Tub: Walk-in shower;Door   [employer] Toilet: Standard     Home Equipment: Environmental consultant - 2 wheels;Cane - single point;Shower seat;Bedside commode   Additional Comments: Pt reports that she has hired to [employer] to assist prn      Prior Functioning/Environment Level of Independence: Independent     OT Diagnosis: Generalized weakness   OT Problem List:  n/a, no acute OT needs identified    OT Treatment/Interventions:   n/a, no acute OT needs identified   OT Goals(Current goals can be found in the care plan section) Acute Rehab OT Goals Patient Stated Goal: go home soon OT Goal Formulation: All assessment and education complete, DC therapy  OT Frequency:  n/a, no acute OT needs identified    Barriers to D/C:  none known at this time    End of Session Equipment Utilized During Treatment: Gait  belt;Rolling walker  Activity Tolerance: Patient tolerated treatment well Patient left: in chair;with call bell/phone within reach;with chair alarm set   Time: 1117-1130 OT Time Calculation (min): 13 min Charges:  OT General Charges $OT Visit: 1 Procedure OT Evaluation $Initial OT Evaluation Tier I: 1 Procedure  Chrys Racer , MS, OTR/L, CLT Pager: [phone]  07/23/2015, 11:44 AM

## 2015-07-23 NOTE — Progress Notes (Signed)
Physical Therapy Treatment Patient Details Name: Leslie [name] MRN: [id number] DOB: [birth year] Today's Date: 07/23/2015    History of Present Illness 81 y.o. female with h/o L TKA 01/21/15, LBBB, CVA, MI, HF, HTN, lumbar laminectomy admitted for R TKA 07/22/15.    PT Comments    Patient is progressing nicely. Plans DC tomorrow  Follow Up Recommendations  Home health PT     Equipment Recommendations  None recommended by PT    Recommendations for Other Services       Precautions / Restrictions Precautions Precautions: Fall;Knee Restrictions Weight Bearing Restrictions: Yes RLE Weight Bearing: Weight bearing as tolerated    Mobility  Bed Mobility   Bed Mobility: Supine to Sit     Supine to sit: Min assist     General bed mobility comments: extra time tio scoot.  Transfers Overall transfer level: Needs assistance Equipment used: Rolling walker (2 wheeled) Transfers: Sit to/from Stand Sit to Stand: Supervision         General transfer comment: supervision for safety, no cueing needed for hand placement or sequencing.   Ambulation/Gait Ambulation/Gait assistance: Min guard Ambulation Distance (Feet): 120 Feet Assistive device: Rolling walker (2 wheeled) Gait Pattern/deviations: Step-to pattern;Step-through pattern     General Gait Details: steady with RW, verbal cues sequencing   Stairs            Wheelchair Mobility    Modified Rankin (Stroke Patients Only)       Balance Overall balance assessment: Modified Independent                                  Cognition Arousal/Alertness: Awake/alert Behavior During Therapy: WFL for tasks assessed/performed Overall Cognitive Status: Within Functional Limits for tasks assessed                      Exercises Total Joint Exercises Ankle Circles/Pumps: AROM;Both;10 reps;Supine Quad Sets: AROM;Both;10 reps;Supine Towel Squeeze: AROM;Both;10 reps;Supine Short Arc Quad:  AAROM;Right;10 reps;Supine Heel Slides: AAROM;Right;10 reps;Supine Hip ABduction/ADduction: AAROM;Right;10 reps;Supine Straight Leg Raises: Right;10 reps;Supine;AROM Long Arc Quad: AROM;Right;5 reps;Seated Goniometric ROM: 0-60 r knee    General Comments        Pertinent Vitals/Pain Pain Assessment: Faces Faces Pain Scale: Hurts little more Pain Location: R knee Pain Descriptors / Indicators: Tender Pain Intervention(s): Limited activity within patient's tolerance;Monitored during session;Premedicated before session;Repositioned;Ice applied    Home Living Family/patient expects to be discharged to:: Private residence Living Arrangements: Alone Available Help at Discharge: Family;Personal care attendant;Available 24 hours/day Type of Home: House Home Access: Stairs to enter Entrance Stairs-Rails: Left Home Layout: One level Home Equipment: Walker - 2 wheels;Cane - single point;Shower seat;Bedside commode Additional Comments: Pt reports that she has hired to [employer] to assist prn    Prior Function Level of Independence: Independent          PT Goals (current goals can now be found in the care plan section) Acute Rehab PT Goals Patient Stated Goal: go home soon Progress towards PT goals: Progressing toward goals    Frequency  7X/week    PT Plan Current plan remains appropriate    Co-evaluation             End of Session   Activity Tolerance: Patient tolerated treatment well Patient left: in chair;with call bell/phone within reach;with chair alarm set;with family/visitor present     Time: 1030-1056 PT Time Calculation (min) (ACUTE ONLY):  26 min  Charges:  $Gait Training: 8-22 mins $Therapeutic Exercise: 8-22 mins                    G Codes:      [name] 07/23/2015, 1:37 PM

## 2015-07-24 LAB — BASIC METABOLIC PANEL
ANION GAP: 4 — AB (ref 5–15)
BUN: 24 mg/dL — AB (ref 6–20)
CALCIUM: 8.4 mg/dL — AB (ref 8.9–10.3)
CO2: 28 mmol/L (ref 22–32)
Chloride: 107 mmol/L (ref 101–111)
Creatinine, Ser: 0.87 mg/dL (ref 0.44–1.00)
GFR calc Af Amer: >60 mL/min (ref >60)
GFR calc non Af Amer: >60 mL/min (ref >60)
GLUCOSE: 102 mg/dL — AB (ref 65–99)
POTASSIUM: 4.7 mmol/L (ref 3.5–5.1)
Sodium: 139 mmol/L (ref 135–145)

## 2015-07-24 LAB — CBC
HEMATOCRIT: 26.9 % — AB (ref 36.0–46.0)
Hemoglobin: 8.3 g/dL — ABNORMAL LOW (ref 12.0–15.0)
MCH: 28.8 pg (ref 26.0–34.0)
MCHC: 30.9 g/dL (ref 30.0–36.0)
MCV: 93.4 fL (ref 78.0–100.0)
Platelets: 161 10*3/uL (ref 150–400)
RBC: 2.88 MIL/uL — ABNORMAL LOW (ref 3.87–5.11)
RDW: 14.7 % (ref 11.5–15.5)
WBC: 5.9 10*3/uL (ref 4.0–10.5)

## 2015-07-24 MED ORDER — FERROUS SULFATE 325 (65 FE) MG PO TABS: 325.0000 mg | ORAL_TABLET | Freq: Three times a day (TID) | ORAL | Status: DC

## 2015-07-24 MED ORDER — TIZANIDINE HCL 4 MG PO TABS: 4.0000 mg | ORAL_TABLET | Freq: Four times a day (QID) | ORAL | Status: DC | PRN

## 2015-07-24 MED ORDER — DOCUSATE SODIUM 100 MG PO CAPS: 100.0000 mg | ORAL_CAPSULE | Freq: Two times a day (BID) | ORAL | Status: DC

## 2015-07-24 MED ORDER — HYDROCODONE-ACETAMINOPHEN 7.5-325 MG PO TABS: 1.0000 | ORAL_TABLET | ORAL | Status: DC | PRN

## 2015-07-24 MED ORDER — POLYETHYLENE GLYCOL 3350 17 G PO PACK: 17.0000 g | PACK | Freq: Two times a day (BID) | ORAL | Status: DC

## 2015-07-24 NOTE — Progress Notes (Signed)
Discussed with patient and son discharge instructions, both verbalized agreement and understanding.  Patient's IV was discontinued with no complications.  Patient to go down in wheelchair with all belongings to go home in private vehicle.

## 2015-07-24 NOTE — Progress Notes (Signed)
     Subjective: 2 Days Post-Op Procedure(s) (LRB): RIGHT TOTAL KNEE ARTHROPLASTY (Right)   Patient reports pain as mild, pain controlled. No events throughout the night. Discussed staying well hydrated and continueing to work hard with PT. Ready to be discharged home.  Objective:   VITALS:   Filed Vitals:   07/23/15 2015 07/24/15 0651  BP: 118/54 122/64  Pulse: 60 64  Temp: 98.8 F (37.1 C) 98.6 F (37 C)  Resp: 16 16    Dorsiflexion/Plantar flexion intact Incision: dressing C/D/I No cellulitis present Compartment soft  LABS  Recent Labs  07/23/15 0520 07/24/15 0410  HGB 9.2* 8.3*  HCT 28.9* 26.9*  WBC 10.2 5.9  PLT 172 161     Recent Labs  07/23/15 0520 07/24/15 0410  NA 140 139  K 5.1 4.7  BUN 25* 24*  CREATININE 1.04* 0.87  GLUCOSE 126* 102*     Assessment/Plan: 2 Days Post-Op Procedure(s) (LRB): RIGHT TOTAL KNEE ARTHROPLASTY (Right) Up with therapy Discharge home with home health  Follow up in 2 weeks at [facility]. Follow up with OLIN,Jaden Batchelder D in 2 weeks.  Contact information:  [facility] [address], [address] [place]   PAC  07/24/2015, 9:07 AM

## 2015-07-24 NOTE — Progress Notes (Signed)
Physical Therapy Treatment Patient Details Name: Leslie [name] MRN: [id number] DOB: 1930/10/19 Today's Date: 07/24/2015    History of Present Illness 81 y.o. female with h/o L TKA 01/21/15, LBBB, CVA, MI, HF, HTN, lumbar laminectomy admitted for R TKA 07/22/15.    PT Comments    Patient is progressing well. Edema of the right leg noted. Instructed to elevate  3 times a day, higher than her heart.  Follow Up Recommendations  Home health PT     Equipment Recommendations  None recommended by PT    Recommendations for Other Services       Precautions / Restrictions Precautions Precautions: Fall;Knee    Mobility  Bed Mobility         Supine to sit: Supervision        Transfers   Equipment used: Rolling walker (2 wheeled)   Sit to Stand: Supervision         General transfer comment: supervision for safety, no cueing needed for hand placement or sequencing.   Ambulation/Gait Ambulation/Gait assistance: Min guard Ambulation Distance (Feet): 50 Feet   Gait Pattern/deviations: Step-through pattern;Trunk flexed Gait velocity: decr,   General Gait Details: R knee with tendency to have  knee flexion during ambulation   Stairs Stairs: Yes Stairs assistance: Min assist Stair Management: Forwards;With cane;One rail Right Number of Stairs: 3 General stair comments: son present for instruction.  Wheelchair Mobility    Modified Rankin (Stroke Patients Only)       Balance                                    Cognition Arousal/Alertness: Awake/alert                          Exercises Total Joint Exercises Ankle Circles/Pumps: AROM;Both;10 reps;Supine Quad Sets: AROM;Both;10 reps;Supine Towel Squeeze: AROM;Both;10 reps;Supine Short Arc Quad: AAROM;Right;10 reps;Supine Heel Slides: AAROM;Right;10 reps;Supine Hip ABduction/ADduction: AAROM;Right;10 reps;Supine Straight Leg Raises: Right;10 reps;Supine;AROM Long Arc Quad:  AROM;Right;5 reps;Seated Goniometric ROM: 15-60 right knee    General Comments        Pertinent Vitals/Pain Pain Assessment: Faces Faces Pain Scale: Hurts little more Pain Location: R knee Pain Descriptors / Indicators: Discomfort;Tender Pain Intervention(s): Limited activity within patient's tolerance;Monitored during session;Premedicated before session;Repositioned    Home Living                      Prior Function            PT Goals (current goals can now be found in the care plan section) Progress towards PT goals: Progressing toward goals    Frequency  7X/week    PT Plan      Co-evaluation             End of Session   Activity Tolerance: Patient tolerated treatment well Patient left: with call bell/phone within reach;with bed alarm set;in chair     Time: [id number] PT Time Calculation (min) (ACUTE ONLY): 31 min  Charges:  $Gait Training: 8-22 mins $Therapeutic Exercise: 8-22 mins                    G Codes:      [name] 07/24/2015, 12:12 PM

## 2015-07-31 NOTE — Discharge Summary (Signed)
Physician Discharge Summary  Patient ID: Leslie [name] MRN: [id number] DOB/AGE: 08/17/1930 81 y.o.  Admit date: 07/22/2015 Discharge date: 07/24/2015   Procedures:  Procedure(s) (LRB): RIGHT TOTAL KNEE ARTHROPLASTY (Right)  Attending Physician:  Dr. Paralee Cancel   Admission Diagnoses:   Right knee primary OA / pain  Discharge Diagnoses:  Principal Problem:   S/P right TKA Active Problems:   S/P knee replacement  Past Medical History  Diagnosis Date  . Hypertension   . Heart failure, left-sided ([place])   . MI (myocardial infarction) ([place])   . Bundle branch block left   . Stroke [facility])     blurred vision residual  . GERD (gastroesophageal reflux disease)     tums  . Arthritis   . Anemia   . Depression   . CHF (congestive heart failure) ([facility])     chronic combine CHF  . Nonischemic cardiomyopathy ([place])     '01 EF 30% with normal coronaries; EF 45-50% 03/2012  . Shortness of breath     hx of nothing current on 01/17/2015   . Hypothyroidism   . Anxiety   . Headache     hx of migraines   . Complication of anesthesia     " stomach did not wake up after back surgery "   . Corneal burn     left eye-is almost healed"Cascade pod"    HPI:    Leslie [name], 81 y.o. female, has a history of pain and functional disability in the right knee due to arthritis and has failed non-surgical conservative treatments for greater than 12 weeks to include NSAID's and/or analgesics, corticosteriod injections, use of assistive devices and activity modification. Onset of symptoms was gradual, starting years ago with gradually worsening course since that time. The patient noted prior procedures on the knee to include arthroplasty on the left knee on 01/21/2015 per Dr. Alvan Dame. Patient currently rates pain in the right knee(s) at 9 out of 10 with activity. Patient has worsening of pain with activity and weight bearing, pain that interferes with activities of daily living, pain with passive range of  motion, crepitus and joint swelling. Patient has evidence of periarticular osteophytes and joint space narrowing by imaging studies. There is no active infection. Risks, benefits and expectations were discussed with the patient. Risks including but not limited to the risk of anesthesia, blood clots, nerve damage, blood vessel damage, failure of the prosthesis, infection and up to and including death. Patient understand the risks, benefits and expectations and wishes to proceed with surgery.   PCP: Garlan Fair, MD   Discharged Condition: good  Hospital Course:  Patient underwent the above stated procedure on 07/22/2015. Patient tolerated the procedure well and brought to the recovery room in good condition and subsequently to the floor.  POD #1 BP: 133/57 ; Pulse: 60 ; Temp: 98.1 F (36.7 C) ; Resp: 16 Patient reports pain as mild, pain controlled. No events throughout the night. Dorsiflexion/plantar flexion intact, incision: dressing C/D/I, no cellulitis present and compartment soft.   LABS  Basename    HGB     9.2  HCT     28.9   POD #2  BP: 122/64 ; Pulse: 64 ; Temp: 98.6 F (37 C) ; Resp: 16 Patient reports pain as mild, pain controlled. No events throughout the night. Discussed staying well hydrated and continueing to work hard with PT. Ready to be discharged home. Dorsiflexion/plantar flexion intact, incision: dressing C/D/I, no cellulitis present and compartment soft.  LABS  Basename    HGB     8.3  HCT     26.9    Discharge Exam: General appearance: alert, cooperative and no distress Extremities: Homans sign is negative, no sign of DVT, no edema, redness or tenderness in the calves or thighs and no ulcers, gangrene or trophic changes  Disposition: Home with follow up in 2 weeks   Follow-up Information    Follow up with Mauri Pole, MD. Schedule an appointment as soon as possible for a visit in 2 weeks.   Specialty:  Orthopedic Surgery   Contact information:    [address] [place] [address] [phone]       Follow up with [facility].   Why:  HHPT. Representative will be in touch to arrange first session.    Contact information:   [place] [address] [phone]       Discharge Instructions    Call MD / Call 911    Complete by:  As directed   If you experience chest pain or shortness of breath, CALL 911 and be transported to the hospital emergency room.  If you develope a fever above 101 F, pus (white drainage) or increased drainage or redness at the wound, or calf pain, call your surgeon's office.     Change dressing    Complete by:  As directed   Maintain surgical dressing until follow up in the clinic. If the edges start to pull up, may reinforce with tape. If the dressing is no longer working, may remove and cover with gauze and tape, but must keep the area dry and clean.  Call with any questions or concerns.     Constipation Prevention    Complete by:  As directed   Drink plenty of fluids.  Prune juice may be helpful.  You may use a stool softener, such as Colace (over the counter) 100 mg twice a day.  Use MiraLax (over the counter) for constipation as needed.     Diet - low sodium heart healthy    Complete by:  As directed      Discharge instructions    Complete by:  As directed   Maintain surgical dressing until follow up in the clinic. If the edges start to pull up, may reinforce with tape. If the dressing is no longer working, may remove and cover with gauze and tape, but must keep the area dry and clean.  Follow up in 2 weeks at [facility]. Call with any questions or concerns.     Increase activity slowly as tolerated    Complete by:  As directed   Weight bearing as tolerated with assist device (walker, cane, etc) as directed, use it as long as suggested by your surgeon or therapist, typically at least 4-6 weeks.     TED hose    Complete by:  As directed    Use stockings (TED hose) for 2 weeks on both leg(s).  You may remove them at night for sleeping.             Medication List    TAKE these medications        CALCIUM 1200 PO  Take 1 tablet by mouth daily.     clopidogrel 75 MG tablet  Commonly known as:  PLAVIX  Take 75 mg by mouth daily.     cyanocobalamin 1000 MCG/ML injection  Commonly known as:  (VITAMIN B-12)  Inject 1,000  mcg into the muscle every 30 (thirty) days.     docusate sodium 100 MG capsule  Commonly known as:  COLACE  Take 1 capsule (100 mg total) by mouth 2 (two) times daily.     escitalopram 10 MG tablet  Commonly known as:  LEXAPRO  Take 10 mg by mouth daily.     ferrous sulfate 325 (65 FE) MG tablet  Take 1 tablet (325 mg total) by mouth 3 (three) times daily after meals.     furosemide 20 MG tablet  Commonly known as:  LASIX  Take 20 mg by mouth every morning.     HYDROcodone-acetaminophen 7.5-325 MG tablet  Commonly known as:  NORCO  Take 1-2 tablets by mouth every 4 (four) hours as needed for moderate pain.     levothyroxine 75 MCG tablet  Commonly known as:  SYNTHROID, LEVOTHROID  Take 75 mcg by mouth daily.     LORazepam 1 MG tablet  Commonly known as:  ATIVAN  Take 1 mg by mouth at bedtime.     metoprolol succinate 50 MG 24 hr tablet  Commonly known as:  TOPROL-XL  Take 50 mg by mouth every morning. Take with or immediately following a meal.     polyethylene glycol packet  Commonly known as:  MIRALAX / GLYCOLAX  Take 17 g by mouth 2 (two) times daily.     quinapril 40 MG tablet  Commonly known as:  ACCUPRIL  Take 20 mg by mouth daily.     tiZANidine 4 MG tablet  Commonly known as:  ZANAFLEX  Take 1 tablet (4 mg total) by mouth every 6 (six) hours as needed for muscle spasms.         Signed: [place]. Narciso Stoutenburg   PA-C  07/31/2015, 10:18 AM

## 2015-08-12 DIAGNOSIS — E538 Deficiency of other specified B group vitamins: Secondary | ICD-10-CM | POA: Diagnosis not present

## 2015-08-12 DIAGNOSIS — M1711 Unilateral primary osteoarthritis, right knee: Secondary | ICD-10-CM | POA: Diagnosis not present

## 2015-08-14 DIAGNOSIS — M1711 Unilateral primary osteoarthritis, right knee: Secondary | ICD-10-CM | POA: Diagnosis not present

## 2015-08-21 DIAGNOSIS — M1711 Unilateral primary osteoarthritis, right knee: Secondary | ICD-10-CM | POA: Diagnosis not present

## 2015-08-26 DIAGNOSIS — M1711 Unilateral primary osteoarthritis, right knee: Secondary | ICD-10-CM | POA: Diagnosis not present

## 2015-08-28 DIAGNOSIS — M1711 Unilateral primary osteoarthritis, right knee: Secondary | ICD-10-CM | POA: Diagnosis not present

## 2015-09-10 DIAGNOSIS — Z96651 Presence of right artificial knee joint: Secondary | ICD-10-CM | POA: Diagnosis not present

## 2015-09-10 DIAGNOSIS — Z471 Aftercare following joint replacement surgery: Secondary | ICD-10-CM | POA: Diagnosis not present

## 2015-10-22 DIAGNOSIS — Z96651 Presence of right artificial knee joint: Secondary | ICD-10-CM | POA: Diagnosis not present

## 2015-10-22 DIAGNOSIS — Z471 Aftercare following joint replacement surgery: Secondary | ICD-10-CM | POA: Diagnosis not present

## 2015-11-18 DIAGNOSIS — I10 Essential (primary) hypertension: Secondary | ICD-10-CM | POA: Diagnosis not present

## 2015-11-18 DIAGNOSIS — H18413 Arcus senilis, bilateral: Secondary | ICD-10-CM | POA: Diagnosis not present

## 2015-11-18 DIAGNOSIS — H02839 Dermatochalasis of unspecified eye, unspecified eyelid: Secondary | ICD-10-CM | POA: Diagnosis not present

## 2015-11-18 DIAGNOSIS — H4923 Sixth [abducent] nerve palsy, bilateral: Secondary | ICD-10-CM | POA: Diagnosis not present

## 2016-01-08 DEATH — deceased

## 2016-05-06 DIAGNOSIS — M1990 Unspecified osteoarthritis, unspecified site: Secondary | ICD-10-CM | POA: Diagnosis not present

## 2016-05-06 DIAGNOSIS — Z8673 Personal history of transient ischemic attack (TIA), and cerebral infarction without residual deficits: Secondary | ICD-10-CM | POA: Diagnosis not present

## 2016-05-06 DIAGNOSIS — E538 Deficiency of other specified B group vitamins: Secondary | ICD-10-CM | POA: Diagnosis not present

## 2016-05-06 DIAGNOSIS — Z23 Encounter for immunization: Secondary | ICD-10-CM | POA: Diagnosis not present

## 2016-05-06 DIAGNOSIS — F329 Major depressive disorder, single episode, unspecified: Secondary | ICD-10-CM | POA: Diagnosis not present

## 2016-05-06 DIAGNOSIS — I429 Cardiomyopathy, unspecified: Secondary | ICD-10-CM | POA: Diagnosis not present

## 2016-05-06 DIAGNOSIS — Z0001 Encounter for general adult medical examination with abnormal findings: Secondary | ICD-10-CM | POA: Diagnosis not present

## 2016-05-06 DIAGNOSIS — I10 Essential (primary) hypertension: Secondary | ICD-10-CM | POA: Diagnosis not present

## 2016-05-06 DIAGNOSIS — E039 Hypothyroidism, unspecified: Secondary | ICD-10-CM | POA: Diagnosis not present

## 2016-05-25 DIAGNOSIS — H4923 Sixth [abducent] nerve palsy, bilateral: Secondary | ICD-10-CM | POA: Diagnosis not present

## 2016-05-25 DIAGNOSIS — H02839 Dermatochalasis of unspecified eye, unspecified eyelid: Secondary | ICD-10-CM | POA: Diagnosis not present

## 2016-05-25 DIAGNOSIS — H18413 Arcus senilis, bilateral: Secondary | ICD-10-CM | POA: Diagnosis not present

## 2016-05-25 DIAGNOSIS — I10 Essential (primary) hypertension: Secondary | ICD-10-CM | POA: Diagnosis not present

## 2016-06-14 DIAGNOSIS — E538 Deficiency of other specified B group vitamins: Secondary | ICD-10-CM | POA: Diagnosis not present

## 2016-07-05 ENCOUNTER — Encounter: Payer: Self-pay | Admitting: Interventional Cardiology

## 2016-07-12 ENCOUNTER — Ambulatory Visit: Payer: Medicare Other | Admitting: Interventional Cardiology

## 2016-07-19 DIAGNOSIS — E538 Deficiency of other specified B group vitamins: Secondary | ICD-10-CM | POA: Diagnosis not present

## 2016-08-19 DIAGNOSIS — E538 Deficiency of other specified B group vitamins: Secondary | ICD-10-CM | POA: Diagnosis not present

## 2016-10-14 ENCOUNTER — Ambulatory Visit (INDEPENDENT_AMBULATORY_CARE_PROVIDER_SITE_OTHER): Payer: Medicare Other | Admitting: Interventional Cardiology

## 2016-10-14 ENCOUNTER — Encounter: Payer: Self-pay | Admitting: Interventional Cardiology

## 2016-10-14 VITALS — BP 144/64 | HR 53 | Ht 67.0 in | Wt 159.6 lb

## 2016-10-14 DIAGNOSIS — I10 Essential (primary) hypertension: Secondary | ICD-10-CM

## 2016-10-14 DIAGNOSIS — I5032 Chronic diastolic (congestive) heart failure: Secondary | ICD-10-CM

## 2016-10-14 DIAGNOSIS — I447 Left bundle-branch block, unspecified: Secondary | ICD-10-CM

## 2016-10-14 DIAGNOSIS — E538 Deficiency of other specified B group vitamins: Secondary | ICD-10-CM | POA: Diagnosis not present

## 2016-10-14 LAB — COMPREHENSIVE METABOLIC PANEL
A/G RATIO: 1.8 (ref 1.2–2.2)
ALBUMIN: 4.2 g/dL (ref 3.5–4.7)
ALT: 8 IU/L (ref 0–32)
AST: 14 IU/L (ref 0–40)
Alkaline Phosphatase: 64 IU/L (ref 39–117)
BUN / CREAT RATIO: 14 (ref 12–28)
BUN: 14 mg/dL (ref 8–27)
Bilirubin Total: 0.5 mg/dL (ref 0.0–1.2)
CALCIUM: 9.4 mg/dL (ref 8.7–10.3)
CO2: 23 mmol/L (ref 18–29)
CREATININE: 0.98 mg/dL (ref 0.57–1.00)
Chloride: 104 mmol/L (ref 96–106)
GFR, EST AFRICAN AMERICAN: 61 mL/min/{1.73_m2} (ref >59)
GFR, EST NON AFRICAN AMERICAN: 53 mL/min/{1.73_m2} — AB (ref >59)
GLOBULIN, TOTAL: 2.3 g/dL (ref 1.5–4.5)
Glucose: 91 mg/dL (ref 65–99)
Potassium: 4.7 mmol/L (ref 3.5–5.2)
SODIUM: 143 mmol/L (ref 134–144)
Total Protein: 6.5 g/dL (ref 6.0–8.5)

## 2016-10-14 NOTE — Patient Instructions (Addendum)
Medication Instructions:  None  Labwork: CMET today  Testing/Procedures: None  Follow-Up: Your physician wants you to follow-up in: 1 year with Dr. Tamala Julian. You will receive a reminder letter in the mail two months in advance. If you don't receive a letter, please call our office to schedule the follow-up appointment.   Any Other Special Instructions Will Be Listed Below (If Applicable).     If you need a refill on your cardiac medications before your next appointment, please call your pharmacy.

## 2016-10-14 NOTE — Progress Notes (Signed)
Cardiology Office Note    Date:  10/14/2016   ID:  Leslie [name], DOB Jan 20, 1931, MRN [id number]  PCP:  Garlan Fair, MD  Cardiologist: Sinclair Grooms, MD   Chief Complaint  Patient presents with  . Congestive Heart Failure    History of Present Illness:  Leslie [name] is a 83 y.o. female who presents for left bundle branch block, chronic diastolic heart failure (EF greater than 50%), history of CVA, and essential hypertension.  She is doing well. She has been depressed because her son who has 2 teenage daughters lost his house and was living with her. It didn't work out as there was a lot of stress and discord. She finally had to asked him to leave. During this time she felt heaviness in her chest and was distraught. They have now left home and she feels much better. No dyspnea or chest discomfort.  She denies orthopnea, PND, lower extremity swelling, and medication side effects.   Past Medical History:  Diagnosis Date  . Anemia   . Anxiety   . Arthritis   . Bundle branch block left   . CHF (congestive heart failure) ([facility])    chronic combine CHF  . Complication of anesthesia    " stomach did not wake up after back surgery "   . Corneal burn    left eye-is almost healed"Cascade pod"  . Depression   . GERD (gastroesophageal reflux disease)    tums  . Headache    hx of migraines   . Heart failure, left-sided ([place])   . Hypertension   . Hypothyroidism   . MI (myocardial infarction)   . Nonischemic cardiomyopathy ([place])    '01 EF 30% with normal coronaries; EF 45-50% 03/2012  . Shortness of breath    hx of nothing current on 01/17/2015   . Stroke [facility])    blurred vision residual    Past Surgical History:  Procedure Laterality Date  . CARDIAC CATHETERIZATION     13 yrs. ago  . CATARACT EXTRACTION, BILATERAL Bilateral   . detached retinia    . DILATION AND CURETTAGE OF UTERUS    . EYE SURGERY     bilateral cataracts  . HERNIA REPAIR     hiatial  . hiatal  hernia surgery     . LUMBAR LAMINECTOMY/DECOMPRESSION MICRODISCECTOMY  05/17/2012   Procedure: LUMBAR LAMINECTOMY/DECOMPRESSION MICRODISCECTOMY 2 LEVELS;  Surgeon: Ophelia Charter, MD;  Location: [facility];  Service: Neurosurgery;  Laterality: N/A;  Lumbar four laminectomy with bilateral Lumbar three laminotomies and removal of synovial cyst  . TONSILLECTOMY    . TOTAL KNEE ARTHROPLASTY Left 01/21/2015   Procedure: LEFT TOTAL KNEE ARTHROPLASTY;  Surgeon: Paralee Cancel, MD;  Location: [facility];  Service: Orthopedics;  Laterality: Left;  . TOTAL KNEE ARTHROPLASTY Right 07/22/2015   Procedure: RIGHT TOTAL KNEE ARTHROPLASTY;  Surgeon: Paralee Cancel, MD;  Location: [facility];  Service: Orthopedics;  Laterality: Right;    Current Medications: Outpatient Medications Prior to Visit  Medication Sig Dispense Refill  . Calcium Carbonate-Vit D-Min (CALCIUM 1200 PO) Take 1 tablet by mouth daily.    . clopidogrel (PLAVIX) 75 MG tablet Take 75 mg by mouth daily.   3  . cyanocobalamin (,VITAMIN B-12,) 1000 MCG/ML injection Inject 1,000 mcg into the muscle every 30 (thirty) days.    Marland Kitchen escitalopram (LEXAPRO) 10 MG tablet Take 10 mg by mouth daily.    . furosemide (LASIX) 20 MG tablet Take 20 mg by  mouth every morning.    Marland Kitchen levothyroxine (SYNTHROID, LEVOTHROID) 75 MCG tablet Take 75 mcg by mouth daily.  2  . metoprolol succinate (TOPROL-XL) 50 MG 24 hr tablet Take 50 mg by mouth every morning. Take with or immediately following a meal.    . quinapril (ACCUPRIL) 40 MG tablet Take 20 mg by mouth daily.     Marland Kitchen docusate sodium (COLACE) 100 MG capsule Take 1 capsule (100 mg total) by mouth 2 (two) times daily. (Patient not taking: Reported on 10/14/2016) 10 capsule 0  . ferrous sulfate 325 (65 FE) MG tablet Take 1 tablet (325 mg total) by mouth 3 (three) times daily after meals. (Patient not taking: Reported on 10/14/2016)  3  . HYDROcodone-acetaminophen (NORCO) 7.5-325 MG tablet Take 1-2 tablets by mouth every 4 (four) hours  as needed for moderate pain. (Patient not taking: Reported on 10/14/2016) 100 tablet 0  . LORazepam (ATIVAN) 1 MG tablet Take 1 mg by mouth at bedtime.   5  . polyethylene glycol (MIRALAX / GLYCOLAX) packet Take 17 g by mouth 2 (two) times daily. (Patient not taking: Reported on 10/14/2016) 14 each 0  . tiZANidine (ZANAFLEX) 4 MG tablet Take 1 tablet (4 mg total) by mouth every 6 (six) hours as needed for muscle spasms. (Patient not taking: Reported on 10/14/2016) 40 tablet 0   No facility-administered medications prior to visit.      Allergies:   Sulfa antibiotics; Dilaudid [hydromorphone hcl]; and Xopenex [levalbuterol hcl]   Social History   Social History  . Marital status: Widowed    Spouse name: N/A  . Number of children: N/A  . Years of education: N/A   Social History Main Topics  . Smoking status: Never Smoker  . Smokeless tobacco: Never Used  . Alcohol use No  . Drug use: No  . Sexual activity: Not Asked   Other Topics Concern  . None   Social History Narrative  . None     Family History:  The patient's family history includes CAD in her mother; Hypertension in her sister.   ROS:   Please see the history of present illness.    Vision disturbance, depression, dizziness.  All other systems reviewed and are negative.   PHYSICAL EXAM:   VS:  BP (!) 144/64 (BP Location: Left Arm)   Pulse (!) 53   Ht 5\' 7"  (1.702 m)   Wt 159 lb 9.6 oz (72.4 kg)   BMI 25.00 kg/m    GEN: Well nourished, well developed, in no acute distress  HEENT: normal  Neck: no JVD, carotid bruits, or masses Cardiac: RRR; no murmurs, rubs, or gallops,no edema  Respiratory:  clear to auscultation bilaterally, normal work of breathing GI: soft, nontender, nondistended, + BS MS: no deformity or atrophy  Skin: warm and dry, no rash Neuro:  Alert and Oriented x 3, Strength and sensation are intact Psych: euthymic mood, full affect  Wt Readings from Last 3 Encounters:  10/14/16 159 lb 9.6 oz (72.4  kg)  07/22/15 158 lb (71.7 kg)  07/16/15 158 lb (71.7 kg)      Studies/Labs Reviewed:   EKG:  EKG  Sinus bradycardia at 53 bpm, left axis deviation, left bundle branch block with QRS duration 152 ms.  Recent Labs: No results found for requested labs within last 8760 hours.   Lipid Panel No results found for: CHOL, TRIG, HDL, CHOLHDL, VLDL, LDLCALC, LDLDIRECT  Additional studies/ records that were reviewed today include:  Echocardiogram 2016:  Study Conclusions   - Left ventricle: The cavity size was normal. There was mild focal   basal hypertrophy of the septum. Systolic function was normal.   The estimated ejection fraction was in the range of 55% to 60%.   Wall motion was normal; there were no regional wall motion   abnormalities. There was an increased relative contribution of   atrial contraction to ventricular filling. Doppler parameters are   consistent with abnormal left ventricular relaxation (grade 1   diastolic dysfunction). - Ventricular septum: Septal motion showed paradox. These changes   are consistent with intraventricular conduction delay. - Aortic valve: Trileaflet; normal thickness, mildly calcified   leaflets. There was mild regurgitation. - Mitral valve: Calcified annulus. There was trivial regurgitation. - Tricuspid valve: There was trivial regurgitation. - Pulmonic valve: There was trivial regurgitation.     ASSESSMENT:    1. Chronic diastolic HF (heart failure) ([place])   2. Essential hypertension   3. LBBB (left bundle branch block)      PLAN:  In order of problems listed above:  1. Evidence of volume overload. Will do a comprehensive metabolic panel as this is not been checked in quite some time. 2. Adequate control. 2 g sodium diet and is reiterated. 3. No change compared to prior tracings.  Continue conservative clinical follow-up. Needs to have basic metabolic panel performed with her diuretic and ACE inhibitor therapy.    Medication  Adjustments/Labs and Tests Ordered: Current medicines are reviewed at length with the patient today.  Concerns regarding medicines are outlined above.  Medication changes, Labs and Tests ordered today are listed in the Patient Instructions below. There are no Patient Instructions on file for this visit.   Signed, Sinclair Grooms, MD  10/14/2016 11:52 AM    [facility] [place], [place], [place]  [address] Phone: [phone]; Fax: [phone]

## 2016-10-17 DIAGNOSIS — J069 Acute upper respiratory infection, unspecified: Secondary | ICD-10-CM | POA: Diagnosis not present

## 2016-11-15 DIAGNOSIS — E538 Deficiency of other specified B group vitamins: Secondary | ICD-10-CM | POA: Diagnosis not present

## 2016-12-17 DIAGNOSIS — E538 Deficiency of other specified B group vitamins: Secondary | ICD-10-CM | POA: Diagnosis not present

## 2016-12-27 ENCOUNTER — Emergency Department (HOSPITAL_COMMUNITY): Payer: Medicare Other

## 2016-12-27 ENCOUNTER — Encounter (HOSPITAL_COMMUNITY): Payer: Self-pay | Admitting: Emergency Medicine

## 2016-12-27 ENCOUNTER — Emergency Department (HOSPITAL_COMMUNITY)
Admission: EM | Admit: 2016-12-27 | Discharge: 2016-12-27 | Disposition: A | Discharge status: Home / Self Care | Payer: Medicare Other | Attending: Emergency Medicine | Admitting: Emergency Medicine

## 2016-12-27 DIAGNOSIS — Y999 Unspecified external cause status: Secondary | ICD-10-CM | POA: Diagnosis not present

## 2016-12-27 DIAGNOSIS — I5032 Chronic diastolic (congestive) heart failure: Secondary | ICD-10-CM | POA: Diagnosis not present

## 2016-12-27 DIAGNOSIS — I252 Old myocardial infarction: Secondary | ICD-10-CM | POA: Diagnosis not present

## 2016-12-27 DIAGNOSIS — Z8673 Personal history of transient ischemic attack (TIA), and cerebral infarction without residual deficits: Secondary | ICD-10-CM | POA: Diagnosis not present

## 2016-12-27 DIAGNOSIS — Y9289 Other specified places as the place of occurrence of the external cause: Secondary | ICD-10-CM | POA: Insufficient documentation

## 2016-12-27 DIAGNOSIS — Z96653 Presence of artificial knee joint, bilateral: Secondary | ICD-10-CM | POA: Insufficient documentation

## 2016-12-27 DIAGNOSIS — Z79899 Other long term (current) drug therapy: Secondary | ICD-10-CM | POA: Insufficient documentation

## 2016-12-27 DIAGNOSIS — W19XXXA Unspecified fall, initial encounter: Secondary | ICD-10-CM | POA: Diagnosis not present

## 2016-12-27 DIAGNOSIS — I110 Hypertensive heart disease with heart failure: Secondary | ICD-10-CM | POA: Insufficient documentation

## 2016-12-27 DIAGNOSIS — E039 Hypothyroidism, unspecified: Secondary | ICD-10-CM | POA: Insufficient documentation

## 2016-12-27 DIAGNOSIS — S82831A Other fracture of upper and lower end of right fibula, initial encounter for closed fracture: Secondary | ICD-10-CM

## 2016-12-27 DIAGNOSIS — Y9389 Activity, other specified: Secondary | ICD-10-CM | POA: Insufficient documentation

## 2016-12-27 DIAGNOSIS — S89301A Unspecified physeal fracture of lower end of right fibula, initial encounter for closed fracture: Secondary | ICD-10-CM | POA: Diagnosis not present

## 2016-12-27 DIAGNOSIS — S99911A Unspecified injury of right ankle, initial encounter: Secondary | ICD-10-CM | POA: Diagnosis present

## 2016-12-27 DIAGNOSIS — S8264XA Nondisplaced fracture of lateral malleolus of right fibula, initial encounter for closed fracture: Secondary | ICD-10-CM | POA: Insufficient documentation

## 2016-12-27 NOTE — Progress Notes (Signed)
Orthopedic Tech Progress Note Patient Details:  Leslie [name] 1931/02/10 [id number]    applied cam walker to pt right foot. pt tolerated well. ambublated fair with cam walker. (right foot)    Kristopher Oppenheim 12/27/2016, 5:11 PM

## 2016-12-27 NOTE — ED Triage Notes (Signed)
Pt st's she was here with another pt and when she stood up her leg gave way with her causing her to fall.  Pt has pain and swelling to right ankle.

## 2016-12-27 NOTE — ED Notes (Signed)
Ortho paged to apply cam walker.

## 2016-12-27 NOTE — ED Provider Notes (Signed)
[facility] DEPT Provider Note   CSN: [id number] Arrival date & time: 12/27/16  1438  By signing my name below, Alexia Dara Lords, attest that this documentation has been prepared under the direction and in the presence of Davonna Belling, MD.  Electronically Signed: Wynelle Beckmann, [place] 12/27/2016. 4:37 PM.  History   Chief Complaint Chief Complaint  Patient presents with  . Ankle Pain   The history is provided by the patient. No language interpreter was used.   HPI Comments:  Leslie [name] is a 83 y.o. female who presents to the Emergency Department complaining of sudden onset right ankle pain s/p fall that occurred today. She reports falling in the ED while waiting for a friend when her leg buckled and twisted. Pt reports associated swelling to the ankle. She notes a history of 2 prior fractures to the same ankle. She denies head injury or LOC. She denies any other injuries at this time.   Past Medical History:  Diagnosis Date  . Anemia   . Anxiety   . Arthritis   . Bundle branch block left   . CHF (congestive heart failure) (HCC)    chronic combine CHF  . Complication of anesthesia    " stomach did not wake up after back surgery "   . Corneal burn    left eye-is almost healed"Cascade pod"  . Depression   . GERD (gastroesophageal reflux disease)    tums  . Headache    hx of migraines   . Heart failure, left-sided ([place])   . Hypertension   . Hypothyroidism   . MI (myocardial infarction) ([place])   . Nonischemic cardiomyopathy ([place])    '01 EF 30% with normal coronaries; EF 45-50% 03/2012  . Shortness of breath    hx of nothing current on 01/17/2015   . Stroke [facility])    blurred vision residual    Patient Active Problem List   Diagnosis Date Noted  . S/P right TKA 07/22/2015  . S/P left TKA 01/21/2015  . LBBB (left bundle branch block) 06/16/2014  . Chronic diastolic HF (heart failure) ([place]) 06/16/2014  . Ileus, postoperative ([place]) 05/25/2012  . Hypothyroidism  05/25/2012  . HTN (hypertension) 05/25/2012    Past Surgical History:  Procedure Laterality Date  . CARDIAC CATHETERIZATION     13 yrs. ago  . CATARACT EXTRACTION, BILATERAL Bilateral   . detached retinia    . DILATION AND CURETTAGE OF UTERUS    . EYE SURGERY     bilateral cataracts  . HERNIA REPAIR     hiatial  . hiatal hernia surgery     . LUMBAR LAMINECTOMY/DECOMPRESSION MICRODISCECTOMY  05/17/2012   Procedure: LUMBAR LAMINECTOMY/DECOMPRESSION MICRODISCECTOMY 2 LEVELS;  Surgeon: Ophelia Charter, MD;  Location: [place] [facility];  Service: Neurosurgery;  Laterality: N/A;  Lumbar four laminectomy with bilateral Lumbar three laminotomies and removal of synovial cyst  . TONSILLECTOMY    . TOTAL KNEE ARTHROPLASTY Left 01/21/2015   Procedure: LEFT TOTAL KNEE ARTHROPLASTY;  Surgeon: Paralee Cancel, MD;  Location: [facility];  Service: Orthopedics;  Laterality: Left;  . TOTAL KNEE ARTHROPLASTY Right 07/22/2015   Procedure: RIGHT TOTAL KNEE ARTHROPLASTY;  Surgeon: Paralee Cancel, MD;  Location: [facility]S;  Service: Orthopedics;  Laterality: Right;    OB History    No data available       Home Medications    Prior to Admission medications   Medication Sig Start Date End Date Taking? Authorizing Provider  Calcium Carbonate-Vit D-Min (CALCIUM 1200 PO)  Take 1 tablet by mouth daily.    [provider]  clopidogrel (PLAVIX) 75 MG tablet Take 75 mg by mouth daily.  06/02/14   [provider]  cyanocobalamin (,VITAMIN B-12,) 1000 MCG/ML injection Inject 1,000 mcg into the muscle every 30 (thirty) days.    [provider]  escitalopram (LEXAPRO) 10 MG tablet Take 10 mg by mouth daily.    [provider]  furosemide (LASIX) 20 MG tablet Take 20 mg by mouth every morning.    [provider]  levothyroxine (SYNTHROID, LEVOTHROID) 75 MCG tablet Take 75 mcg by mouth daily. 06/04/14   [provider]  metoprolol succinate (TOPROL-XL) 50 MG 24 hr tablet Take  50 mg by mouth every morning. Take with or immediately following a meal.    [provider]  quinapril (ACCUPRIL) 40 MG tablet Take 20 mg by mouth daily.     [provider]    Family History Family History  Problem Relation Age of Onset  . CAD Mother   . Hypertension Sister     Social History Social History  Substance Use Topics  . Smoking status: Never Smoker  . Smokeless tobacco: Never Used  . Alcohol use No     Allergies   Sulfa antibiotics; Dilaudid [hydromorphone hcl]; and Xopenex [levalbuterol hcl]   Review of Systems Review of Systems  Musculoskeletal: Positive for arthralgias and joint swelling.  Neurological: Negative for syncope and weakness.     Physical Exam Updated Vital Signs Ht 5' 7.5" (1.715 m)   Wt 68 kg (150 lb)   BMI 23.15 kg/m   Physical Exam  Constitutional: She appears well-developed and well-nourished. No distress.  HENT:  Head: Normocephalic.  Eyes: Conjunctivae are normal.  Neck: Neck supple.  Cardiovascular: Normal rate, regular rhythm, normal heart sounds and intact distal pulses.   Pulmonary/Chest: Effort normal and breath sounds normal. No respiratory distress. She has no wheezes. She has no rales.  Abdominal: Soft.  Musculoskeletal: She exhibits tenderness.  RLE: No tenderness over knee or proximal fibula. Tenderness over right ankle laterally over distal fibula. DP pulse intact. Sensation intact. Skin intact. No tenderness over medial malleolus.   Neurological: She is alert. No sensory deficit.  Skin: Skin is warm and dry.  Psychiatric: She has a normal mood and affect.  Nursing note and vitals reviewed.    ED Treatments / Results  COORDINATION OF CARE:  4:38 PM Discussed treatment plan with pt at bedside and pt agreed to plan.  Labs (all labs ordered are listed, but only abnormal results are displayed) Labs Reviewed - No data to display  EKG  EKG Interpretation None       Radiology Dg Ankle  Complete Right  Result Date: 12/27/2016 CLINICAL DATA:  Fall today.  Ankle swelling.  Initial encounter. EXAM: RIGHT ANKLE - COMPLETE 3+ VIEW COMPARISON:  None. FINDINGS: Subtle but convincing nondisplaced fibula fracture below the level of the ankle joint. Chronic appearing fragmentation of the medial malleolus. No malalignment. Soft tissue swelling mainly seen laterally. Possible small ankle joint effusion. Osteopenia. IMPRESSION: Nondisplaced lateral malleolus fracture. Electronically Signed   By: Monte Fantasia M.D.   On: 12/27/2016 16:07    Procedures Procedures (including critical care time)  Medications Ordered in ED Medications - No data to display   Initial Impression / Assessment and Plan / ED Course  I have reviewed the triage vital signs and the nursing notes.  Pertinent labs & imaging results that were available during my  care of the patient were reviewed by me and considered in my medical decision making (see chart for details).     Patient with fall. Distal fibular fracture on right leg. Given Cam Walker. Has a walker at home that she will use. Will be nonweightbearing. Follow-up with Dr. Alvan Dame, who she has seen previously for knee surgery. No tenderness in the foot or more proximal to the fracture.  Final Clinical Impressions(s) / ED Diagnoses   Final diagnoses:  Closed fracture of distal end of right fibula, unspecified fracture morphology, initial encounter    New Prescriptions Discharge Medication List as of 12/27/2016  5:11 PM     I personally performed the services described in this documentation, which was scribed in my presence. The recorded information has been reviewed and is accurate.       Davonna Belling, MD 12/27/16 2025

## 2016-12-28 DIAGNOSIS — S8264XA Nondisplaced fracture of lateral malleolus of right fibula, initial encounter for closed fracture: Secondary | ICD-10-CM | POA: Diagnosis not present

## 2017-01-18 DIAGNOSIS — Z4789 Encounter for other orthopedic aftercare: Secondary | ICD-10-CM | POA: Diagnosis not present

## 2017-01-18 DIAGNOSIS — E538 Deficiency of other specified B group vitamins: Secondary | ICD-10-CM | POA: Diagnosis not present

## 2017-01-18 DIAGNOSIS — S8264XD Nondisplaced fracture of lateral malleolus of right fibula, subsequent encounter for closed fracture with routine healing: Secondary | ICD-10-CM | POA: Diagnosis not present

## 2017-01-28 DIAGNOSIS — Z961 Presence of intraocular lens: Secondary | ICD-10-CM | POA: Diagnosis not present

## 2017-01-28 DIAGNOSIS — Z8669 Personal history of other diseases of the nervous system and sense organs: Secondary | ICD-10-CM | POA: Diagnosis not present

## 2017-01-28 DIAGNOSIS — H50042 Monocular esotropia with other noncomitancies, left eye: Secondary | ICD-10-CM | POA: Diagnosis not present

## 2017-01-28 DIAGNOSIS — H5022 Vertical strabismus, left eye: Secondary | ICD-10-CM | POA: Diagnosis not present

## 2017-02-21 DIAGNOSIS — E538 Deficiency of other specified B group vitamins: Secondary | ICD-10-CM | POA: Diagnosis not present

## 2017-03-24 DIAGNOSIS — E538 Deficiency of other specified B group vitamins: Secondary | ICD-10-CM | POA: Diagnosis not present

## 2017-04-26 DIAGNOSIS — E538 Deficiency of other specified B group vitamins: Secondary | ICD-10-CM | POA: Diagnosis not present

## 2017-04-26 DIAGNOSIS — Z23 Encounter for immunization: Secondary | ICD-10-CM | POA: Diagnosis not present

## 2017-05-27 DIAGNOSIS — E538 Deficiency of other specified B group vitamins: Secondary | ICD-10-CM | POA: Diagnosis not present

## 2017-06-17 DIAGNOSIS — I10 Essential (primary) hypertension: Secondary | ICD-10-CM | POA: Diagnosis not present

## 2017-06-17 DIAGNOSIS — H4923 Sixth [abducent] nerve palsy, bilateral: Secondary | ICD-10-CM | POA: Diagnosis not present

## 2017-06-17 DIAGNOSIS — H02839 Dermatochalasis of unspecified eye, unspecified eyelid: Secondary | ICD-10-CM | POA: Diagnosis not present

## 2017-06-17 DIAGNOSIS — H18413 Arcus senilis, bilateral: Secondary | ICD-10-CM | POA: Diagnosis not present

## 2017-07-06 ENCOUNTER — Encounter: Payer: Self-pay | Admitting: Internal Medicine

## 2017-07-06 ENCOUNTER — Ambulatory Visit (INDEPENDENT_AMBULATORY_CARE_PROVIDER_SITE_OTHER): Payer: Medicare Other | Admitting: Internal Medicine

## 2017-07-06 VITALS — BP 134/78 | HR 47 | Temp 97.5°F | Ht 65.75 in | Wt 158.2 lb

## 2017-07-06 DIAGNOSIS — F320 Major depressive disorder, single episode, mild: Secondary | ICD-10-CM | POA: Insufficient documentation

## 2017-07-06 DIAGNOSIS — E538 Deficiency of other specified B group vitamins: Secondary | ICD-10-CM

## 2017-07-06 DIAGNOSIS — I5032 Chronic diastolic (congestive) heart failure: Secondary | ICD-10-CM | POA: Diagnosis not present

## 2017-07-06 DIAGNOSIS — F39 Unspecified mood [affective] disorder: Secondary | ICD-10-CM

## 2017-07-06 DIAGNOSIS — I10 Essential (primary) hypertension: Secondary | ICD-10-CM

## 2017-07-06 DIAGNOSIS — Z8673 Personal history of transient ischemic attack (TIA), and cerebral infarction without residual deficits: Secondary | ICD-10-CM | POA: Diagnosis not present

## 2017-07-06 DIAGNOSIS — K2100 Gastro-esophageal reflux disease with esophagitis, without bleeding: Secondary | ICD-10-CM

## 2017-07-06 DIAGNOSIS — K219 Gastro-esophageal reflux disease without esophagitis: Secondary | ICD-10-CM | POA: Insufficient documentation

## 2017-07-06 DIAGNOSIS — E039 Hypothyroidism, unspecified: Secondary | ICD-10-CM

## 2017-07-06 LAB — COMPREHENSIVE METABOLIC PANEL
ALBUMIN: 4.1 g/dL (ref 3.5–5.2)
ALT: 9 U/L (ref 0–35)
AST: 16 U/L (ref 0–37)
Alkaline Phosphatase: 51 U/L (ref 39–117)
BILIRUBIN TOTAL: 0.5 mg/dL (ref 0.2–1.2)
BUN: 20 mg/dL (ref 6–23)
CALCIUM: 9.9 mg/dL (ref 8.4–10.5)
CHLORIDE: 102 meq/L (ref 96–112)
CO2: 32 mEq/L (ref 19–32)
CREATININE: 1.04 mg/dL (ref 0.40–1.20)
GFR: 53.55 mL/min — ABNORMAL LOW (ref >60.00)
Glucose, Bld: 100 mg/dL — ABNORMAL HIGH (ref 70–99)
Potassium: 3.9 mEq/L (ref 3.5–5.1)
SODIUM: 141 meq/L (ref 135–145)
Total Protein: 7 g/dL (ref 6.0–8.3)

## 2017-07-06 LAB — LIPID PANEL
CHOL/HDL RATIO: 3
Cholesterol: 194 mg/dL (ref 0–200)
HDL: 56.2 mg/dL (ref >39.00)
LDL Cholesterol: 111 mg/dL — ABNORMAL HIGH (ref 0–99)
NONHDL: 137.66
Triglycerides: 131 mg/dL (ref 0.0–149.0)
VLDL: 26.2 mg/dL (ref 0.0–40.0)

## 2017-07-06 LAB — CBC
HCT: 41.4 % (ref 36.0–46.0)
Hemoglobin: 13.9 g/dL (ref 12.0–15.0)
MCHC: 33.6 g/dL (ref 30.0–36.0)
MCV: 90.5 fl (ref 78.0–100.0)
PLATELETS: 216 10*3/uL (ref 150.0–400.0)
RBC: 4.57 Mil/uL (ref 3.87–5.11)
RDW: 13.1 % (ref 11.5–15.5)
WBC: 6.3 10*3/uL (ref 4.0–10.5)

## 2017-07-06 LAB — T4, FREE: FREE T4: 0.9 ng/dL (ref 0.60–1.60)

## 2017-07-06 LAB — VITAMIN B12

## 2017-07-06 LAB — TSH: TSH: 4.24 u[IU]/mL (ref 0.35–4.50)

## 2017-07-06 MED ORDER — ESOMEPRAZOLE MAGNESIUM 20 MG PO CPDR: 20.0000 mg | DELAYED_RELEASE_CAPSULE | Freq: Every day | ORAL | 11 refills | Status: DC

## 2017-07-06 MED ORDER — CYANOCOBALAMIN 1000 MCG/ML IJ SOLN
1000.0000 ug | Freq: Once | INTRAMUSCULAR | Status: AC
  Administered 2017-07-06: 1000 ug via INTRAMUSCULAR

## 2017-07-06 MED ORDER — ESCITALOPRAM OXALATE 10 MG PO TABS: 5.0000 mg | ORAL_TABLET | Freq: Every day | ORAL | 6 refills | Status: DC

## 2017-07-06 NOTE — Assessment & Plan Note (Signed)
Symptomatic with dysphagia again (like before her hiatal hernia repair) Will Rx omeprazole GI if persistent symptoms

## 2017-07-06 NOTE — Assessment & Plan Note (Signed)
Seems euthyroid ?Will check labs ?

## 2017-07-06 NOTE — Assessment & Plan Note (Signed)
Has done well with injections

## 2017-07-06 NOTE — Assessment & Plan Note (Signed)
On plavix

## 2017-07-06 NOTE — Assessment & Plan Note (Signed)
Compensated Keeps up with Dr Tamala Julian Will check labs

## 2017-07-06 NOTE — Addendum Note (Signed)
Addended by: Modena Nunnery on: 07/06/2017 01:01 PM   Modules accepted: Orders

## 2017-07-06 NOTE — Progress Notes (Signed)
Subjective:    Patient ID: Leslie [name], female    DOB: 1930-11-19, 83 y.o.   MRN: [id number]  HPI Here to establish care Was at [facility] but doctor retired. 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 [place], cans still  Has diplopia Wears prism glasses  Having problems with these new ones--- going to specialist at [facility] to consider surgery  Chronic depression since husband died 3 years ago Feels better now Had been considering weaning off this Does get out with friends and is not anhedonic  Some heartburn lately Regurgitation better after hiatal hernia repair Now with more problems--even when lying down Some dysphagia--- will have to vomit some food up aciphex in past  Known hypothyroidism On replacement  Past TIA On plavix since then Never put on statin though  Current Outpatient Medications on File Prior to Visit  Medication Sig Dispense Refill  . Calcium Carbonate-Vit D-Min (CALCIUM 1200 PO) Take 1 tablet by mouth daily.    . clopidogrel (PLAVIX) 75 MG tablet Take 75 mg by mouth daily.   3  . cyanocobalamin (,VITAMIN B-12,) 1000 MCG/ML injection Inject 1,000 mcg into the muscle every 30 (thirty) days.    Marland Kitchen escitalopram (LEXAPRO) 10 MG tablet Take 10 mg by mouth daily.    . furosemide (LASIX) 20 MG tablet Take 20 mg by mouth every morning.    Marland Kitchen levothyroxine (SYNTHROID, LEVOTHROID) 75 MCG tablet Take 75 mcg by mouth daily.  2  . metoprolol succinate (TOPROL-XL) 50 MG 24 hr tablet Take 50 mg by mouth every morning. Take with or immediately following a meal.    . quinapril (ACCUPRIL) 40 MG tablet Take 20 mg by mouth daily.      No current facility-administered medications on file prior to visit.     Allergies  Allergen Reactions    . Sulfa Antibiotics Other (See Comments)    "urine crystallizes"  . Dilaudid [Hydromorphone Hcl] Nausea Only       . Xopenex [Levalbuterol Hcl] Other (See Comments)    "gives me the shakes"    Past Medical History:  Diagnosis Date  . Anemia   . Anxiety   . Arthritis   . Bundle branch block left   . CHF (congestive heart failure) (HCC)    chronic combine CHF  . Complication of anesthesia    " stomach did not wake up after back surgery "   . Corneal burn    left eye-is almost healed"Cascade pod"  . Depression   . GERD (gastroesophageal reflux disease)    tums  . Headache    hx of migraines   . Heart failure, left-sided ([place])   . Hypertension   . Hypothyroidism   . MI (myocardial infarction) ([place])   . Nonischemic cardiomyopathy ([place])    '01 EF 30% with normal coronaries; EF 45-50% 03/2012  . Shortness of breath    hx of nothing current on 01/17/2015   . Stroke [facility])    blurred vision residual    Past Surgical History:  Procedure Laterality Date  . CARDIAC CATHETERIZATION     13 yrs. ago  . CATARACT EXTRACTION, BILATERAL Bilateral   . detached retinia    . DILATION AND CURETTAGE OF UTERUS    . EYE SURGERY  bilateral cataracts  . HERNIA REPAIR     hiatial  . hiatal hernia surgery     . LUMBAR LAMINECTOMY/DECOMPRESSION MICRODISCECTOMY  05/17/2012   Procedure: LUMBAR LAMINECTOMY/DECOMPRESSION MICRODISCECTOMY 2 LEVELS;  Surgeon: Ophelia Charter, MD;  Location: [place] NEURO ORS;  Service: Neurosurgery;  Laterality: N/A;  Lumbar four laminectomy with bilateral Lumbar three laminotomies and removal of synovial cyst  . TONSILLECTOMY    . TOTAL KNEE ARTHROPLASTY Left 01/21/2015   Procedure: LEFT TOTAL KNEE ARTHROPLASTY;  Surgeon: Paralee Cancel, MD;  [facility];  Service: Orthopedics;  Laterality: Left;  . TOTAL KNEE ARTHROPLASTY Right 07/22/2015   Procedure: RIGHT TOTAL KNEE ARTHROPLASTY;  Surgeon: Paralee Cancel, MD;  [facility];  Service: Orthopedics;  Laterality:  Right;    Family History  Problem Relation Age of Onset  . CAD Mother   . Dementia Father   . Hypertension Sister   . Diabetes Sister   . Cancer Sister     Social History   Socioeconomic History  . Marital status: Widowed    Spouse name: Not on file  . Number of children: 1  . Years of education: Not on file  . Highest education level: Not on file  Social Needs  . Financial resource strain: Not on file  . Food insecurity - worry: Not on file  . Food insecurity - inability: Not on file  . Transportation needs - medical: Not on file  . Transportation needs - non-medical: Not on file  Occupational History  . Occupation: Regulatory affairs officer    Comment: retired  Tobacco Use  . Smoking status: Never Smoker  . Smokeless tobacco: Never Used  Substance and Sexual Activity  . Alcohol use: No  . Drug use: No  . Sexual activity: No  Other Topics Concern  . Not on file  Social History Narrative   Widowed 2015   1 adopted son      Has living will   Son, then nephew [name], are health care POAs   Not sure about DNR--probably would accept for now   No machine or tube feeds if cognitively unaware   Review of Systems  Constitutional: Negative for fatigue and unexpected weight change.  HENT: Positive for hearing loss.        Recent had tooth pulled--then got cold  Eyes: Positive for visual disturbance. Negative for redness.  Respiratory: Positive for shortness of breath. Negative for cough.   Cardiovascular: Negative for chest pain, palpitations and leg swelling.  Gastrointestinal: Negative for abdominal pain, blood in stool and constipation.  Endocrine: Negative for polydipsia and polyuria.  Genitourinary: Negative for dysuria and hematuria.  Musculoskeletal: Positive for back pain. Negative for arthralgias and joint swelling.  Skin: Negative for rash.       Dry skin  Allergic/Immunologic: Negative for environmental allergies and immunocompromised state.  Neurological:  Positive for dizziness. Negative for syncope and light-headedness.       "staggery" at times---relates to diplopia  Psychiatric/Behavioral: The patient is nervous/anxious.        Sleep disturbed by sleep Lorazepam in past       Objective:   Physical Exam  Constitutional: She appears well-developed. No distress.  HENT:  Mouth/Throat: Oropharynx is clear and moist. No oropharyngeal exudate.  Neck: No thyromegaly present.  Cardiovascular: Normal rate, regular rhythm, normal heart sounds and intact distal pulses. Exam reveals no gallop.  No murmur heard. Pulmonary/Chest: Effort normal and breath sounds normal. No respiratory distress. She has no wheezes. She has  no rales.  Abdominal: Soft. She exhibits no distension. There is no tenderness.  Musculoskeletal: She exhibits no edema or tenderness.  Lymphadenopathy:    She has no cervical adenopathy.  Skin: No rash noted. No erythema.  Psychiatric: She has a normal mood and affect. Her behavior is normal.          Assessment & Plan:

## 2017-07-06 NOTE — Assessment & Plan Note (Signed)
BP Readings from Last 3 Encounters:  07/06/17 134/78  10/14/16 (!) 144/64  07/24/15 122/64   Good control

## 2017-07-06 NOTE — Assessment & Plan Note (Signed)
Chronic mild anxiety and then depression after husband's death Will try cutting the dose and then reevaluate

## 2017-07-07 ENCOUNTER — Telehealth: Payer: Self-pay | Admitting: Internal Medicine

## 2017-07-07 NOTE — Telephone Encounter (Signed)
Copied from [place]. Topic: Quick Communication - See Telephone Encounter >> Jul 07, 2017 10:55 AM Ether Griffins B wrote: CRM for notification. See Telephone encounter for:  Pt didn't get nexium filled due to it costing 300.00 pt is just taking OTC meds for the acid reflux. If she gets worse she will make an appt to come in and see doc.  07/07/17.

## 2017-07-07 NOTE — Telephone Encounter (Signed)
Spoke to pt. She said she is taking Tums when needed. She may try to get the Nexium OTC.

## 2017-07-07 NOTE — Telephone Encounter (Signed)
Let her know it is fine to take OTC. Document what she is taking (should be PPI like nexium though). Due to plavix, she should probably avoid prilosec

## 2017-08-05 ENCOUNTER — Other Ambulatory Visit: Payer: Self-pay | Admitting: Internal Medicine

## 2017-08-05 NOTE — Telephone Encounter (Signed)
Copied from [place] [phone]. Topic: Quick Communication - See Telephone Encounter >> Aug 05, 2017  1:58 PM Hewitt Shorts wrote: CRM for notification. See Telephone encounter for: pt is needing a refill on clopidogrel Best number [phone]   08/05/17.

## 2017-08-05 NOTE — Telephone Encounter (Signed)
Pt requesting a refill on clopidogrel please.

## 2017-08-05 NOTE — Telephone Encounter (Signed)
This should go to her cardiologist

## 2017-08-12 ENCOUNTER — Telehealth: Payer: Self-pay | Admitting: Interventional Cardiology

## 2017-08-12 NOTE — Telephone Encounter (Signed)
Pt calling requesting a refill on Clopidogrel 75 mg tablet. Dr. Tamala Julian did not prescribe this medication. Pt stated that she has changed PCP and her new PCP told her to contact he cardiologist to refill this medication. Pt was last seen 10/2016. Would Dr. Tamala Julian like to refill this medication? Please advise

## 2017-08-12 NOTE — Telephone Encounter (Signed)
Per epic, pcp says it should go to cardiologist.

## 2017-08-12 NOTE — Telephone Encounter (Signed)
Called patient and asked her to check with her cardiologist regarding the refill on her clopidogrel. She stated that she would and wondered if her pcp could possibly start ordering this medication for her. She stated that she has an appointment coming up next week.

## 2017-08-15 NOTE — Telephone Encounter (Signed)
I have no info on why she takes the medication. Was it started neuro when she had a stroke?

## 2017-08-16 ENCOUNTER — Ambulatory Visit (INDEPENDENT_AMBULATORY_CARE_PROVIDER_SITE_OTHER): Payer: Medicare Other

## 2017-08-16 ENCOUNTER — Other Ambulatory Visit: Payer: Self-pay

## 2017-08-16 DIAGNOSIS — E538 Deficiency of other specified B group vitamins: Secondary | ICD-10-CM

## 2017-08-16 MED ORDER — LEVOTHYROXINE SODIUM 75 MCG PO TABS: 75.0000 ug | ORAL_TABLET | Freq: Every day | ORAL | 3 refills | Status: DC

## 2017-08-16 MED ORDER — CLOPIDOGREL BISULFATE 75 MG PO TABS: 75.0000 mg | ORAL_TABLET | Freq: Every day | ORAL | 3 refills | Status: DC

## 2017-08-16 MED ORDER — CYANOCOBALAMIN 1000 MCG/ML IJ SOLN
1000.0000 ug | Freq: Once | INTRAMUSCULAR | Status: AC
  Administered 2017-08-16: 1000 ug via INTRAMUSCULAR

## 2017-08-16 NOTE — Telephone Encounter (Signed)
Pt request refills levothyroxine and plavix to [facility]. Pt established care and had thyroid labs on 07/06/17. Dr Silvio Pate has not prescribed before.Please advise.

## 2017-08-16 NOTE — Telephone Encounter (Signed)
Appears this was started after pt had CVA/TIA.  This was started by Neuro and maintained by pt's PCP over the years.  Refills will need to come from PCP or Neuro as pt is not on this for a cardiac reason.

## 2017-08-16 NOTE — Telephone Encounter (Signed)
Approved: okay both for a year

## 2017-08-16 NOTE — Telephone Encounter (Signed)
Called pt to inform her per Maude Leriche, LPN, Dr. Thompson Caul nurse, it appears plavix was starated after pt had CVA/TIA and was started by Neuro and maintained by pt's PCP over the years and that refills will need to come from new PCP or Neuro, because pt is not on this medication for cardiac reason. I advise the pt that if she has any other problems, questions or concerns to call the office. Pt verbalized understanding.

## 2017-09-05 ENCOUNTER — Telehealth: Payer: Self-pay | Admitting: Internal Medicine

## 2017-09-05 MED ORDER — FUROSEMIDE 20 MG PO TABS: 20.0000 mg | ORAL_TABLET | Freq: Every morning | ORAL | 11 refills | Status: DC

## 2017-09-05 NOTE — Telephone Encounter (Signed)
Okay to fill for a year

## 2017-09-05 NOTE — Telephone Encounter (Signed)
We were not the original prescriber. Looks like the hospital may have done it last.

## 2017-09-05 NOTE — Telephone Encounter (Signed)
Copied from [place]. Topic: Quick Communication - Rx Refill/Question >> Sep 05, 2017 11:04 AM Synthia Innocent wrote: Medication:  furosemide (LASIX) 20 MG tablet   Has the patient contacted their pharmacy? No, Dr Silvio Pate was not last ordering dr  (Agent: If no, request that the patient contact the pharmacy for the refill.)   Preferred Pharmacy (with phone number or street name): [facility]   Agent: Please be advised that RX refills may take up to 3 business days. We ask that you follow-up with your pharmacy.

## 2017-09-05 NOTE — Telephone Encounter (Signed)
Rx sent electronically.  

## 2017-09-09 DIAGNOSIS — I5032 Chronic diastolic (congestive) heart failure: Secondary | ICD-10-CM | POA: Diagnosis not present

## 2017-09-09 DIAGNOSIS — H532 Diplopia: Secondary | ICD-10-CM | POA: Diagnosis not present

## 2017-09-09 DIAGNOSIS — H4922 Sixth [abducent] nerve palsy, left eye: Secondary | ICD-10-CM | POA: Diagnosis not present

## 2017-09-09 DIAGNOSIS — H5000 Unspecified esotropia: Secondary | ICD-10-CM | POA: Diagnosis not present

## 2017-09-20 ENCOUNTER — Telehealth: Payer: Self-pay | Admitting: Internal Medicine

## 2017-09-20 ENCOUNTER — Ambulatory Visit (INDEPENDENT_AMBULATORY_CARE_PROVIDER_SITE_OTHER): Payer: Medicare Other

## 2017-09-20 DIAGNOSIS — E538 Deficiency of other specified B group vitamins: Secondary | ICD-10-CM

## 2017-09-20 MED ORDER — METOPROLOL SUCCINATE ER 50 MG PO TB24: 50.0000 mg | ORAL_TABLET | Freq: Every morning | ORAL | 5 refills | Status: DC

## 2017-09-20 MED ORDER — CYANOCOBALAMIN 1000 MCG/ML IJ SOLN
1000.0000 ug | Freq: Once | INTRAMUSCULAR | Status: AC
  Administered 2017-09-20: 1000 ug via INTRAMUSCULAR

## 2017-09-20 NOTE — Telephone Encounter (Signed)
Pt is requesting [facility]ill [facility]

## 2017-09-20 NOTE — Telephone Encounter (Signed)
Tried to call pt. Need to verify which [facility]. There are 2 in her chart.

## 2017-09-20 NOTE — Telephone Encounter (Signed)
Pt verified [facility]. Rx sent electronically.

## 2017-10-12 NOTE — Progress Notes (Signed)
Cardiology Office Note    Date:  10/13/2017   ID:  Leslie [name], DOB [birth year], MRN [id number]  PCP:  [name], MD  Cardiologist: Sinclair Grooms, MD   Chief Complaint  Patient presents with  . Congestive Heart Failure    History of Present Illness:  Leslie [name] is a 84 y.o. female  who presents for left bundle branch block, chronic diastolic heart failure (EF greater than 50%), history of CVA, and essential hypertension.   She feels okay.  She denies chest pain.  She has not had syncope or prolonged palpitation.  No lower extremity swelling.  Prior history of CVA.  She has noted a pulsatile mass in the right supraclavicular area.  No neurological complaints.   Past Medical History:  Diagnosis Date  . Anemia   . Anxiety   . Arthritis   . Bundle branch block left   . CHF (congestive heart failure) (HCC)    chronic combine CHF  . Complication of anesthesia    " stomach did not wake up after back surgery "   . Corneal burn    left eye-is almost healed"Cascade pod"  . Depression   . GERD (gastroesophageal reflux disease)    tums  . Headache    hx of migraines   . Heart failure, left-sided ([place])   . Hypertension   . Hypothyroidism   . MI (myocardial infarction) ([place])   . Nonischemic cardiomyopathy ([place])    '01 EF 30% with normal coronaries; EF 45-50% 03/2012  . Shortness of breath    hx of nothing current on 01/17/2015   . Stroke [facility])    blurred vision residual    Past Surgical History:  Procedure Laterality Date  . CARDIAC CATHETERIZATION     13 yrs. ago  . CATARACT EXTRACTION, BILATERAL Bilateral   . detached retinia    . DILATION AND CURETTAGE OF UTERUS    . EYE SURGERY     bilateral cataracts  . HERNIA REPAIR     hiatial  . hiatal hernia surgery     . LUMBAR LAMINECTOMY/DECOMPRESSION MICRODISCECTOMY  05/17/2012   Procedure: LUMBAR LAMINECTOMY/DECOMPRESSION MICRODISCECTOMY 2 LEVELS;  Surgeon: Ophelia Charter, MD;  Location: [facility];   Service: Neurosurgery;  Laterality: N/A;  Lumbar four laminectomy with bilateral Lumbar three laminotomies and removal of synovial cyst  . TONSILLECTOMY    . TOTAL KNEE ARTHROPLASTY Left 01/21/2015   Procedure: LEFT TOTAL KNEE ARTHROPLASTY;  Surgeon: Paralee Cancel, MD;  Location: [facility];  Service: Orthopedics;  Laterality: Left;  . TOTAL KNEE ARTHROPLASTY Right 07/22/2015   Procedure: RIGHT TOTAL KNEE ARTHROPLASTY;  Surgeon: Paralee Cancel, MD;  Location: [facility];  Service: Orthopedics;  Laterality: Right;    Current Medications: Outpatient Medications Prior to Visit  Medication Sig Dispense Refill  . Calcium Carbonate-Vit D-Min (CALCIUM 1200 PO) Take 1 tablet by mouth daily.    . clopidogrel (PLAVIX) 75 MG tablet Take 1 tablet (75 mg total) by mouth daily. 90 tablet 3  . cyanocobalamin (,VITAMIN B-12,) 1000 MCG/ML injection Inject 1,000 mcg into the muscle every 30 (thirty) days.    Marland Kitchen escitalopram (LEXAPRO) 10 MG tablet Take 0.5 tablets (5 mg total) by mouth daily. 30 tablet 6  . furosemide (LASIX) 20 MG tablet Take 1 tablet (20 mg total) by mouth every morning. 30 tablet 11  . levothyroxine (SYNTHROID, LEVOTHROID) 75 MCG tablet Take 1 tablet (75 mcg total) by mouth daily. 90 tablet 3  .  metoprolol succinate (TOPROL-XL) 50 MG 24 hr tablet Take 1 tablet (50 mg total) by mouth every morning. Take with or immediately following a meal. 30 tablet 5  . quinapril (ACCUPRIL) 40 MG tablet Take 20 mg by mouth daily.     Marland Kitchen esomeprazole (NEXIUM) 20 MG capsule Take 1 capsule (20 mg total) by mouth daily. (Patient not taking: Reported on 10/13/2017) 30 capsule 11   No facility-administered medications prior to visit.      Allergies:   Sulfa antibiotics; Dilaudid [hydromorphone hcl]; and Xopenex [levalbuterol hcl]   Social History   Socioeconomic History  . Marital status: Widowed    Spouse name: None  . Number of children: 1  . Years of education: None  . Highest education level: None  Social Needs  .  Financial resource strain: None  . Food insecurity - worry: None  . Food insecurity - inability: None  . Transportation needs - medical: None  . Transportation needs - non-medical: None  Occupational History  . Occupation: Regulatory affairs officer    Comment: retired  Tobacco Use  . Smoking status: Never Smoker  . Smokeless tobacco: Never Used  Substance and Sexual Activity  . Alcohol use: No  . Drug use: No  . Sexual activity: No  Other Topics Concern  . None  Social History Narrative   Widowed 2015   1 adopted son      Has living will   Son, then nephew [name], are health care POAs   Not sure about DNR--probably would accept for now   No machine or tube feeds if cognitively unaware     Family History:  The patient's family history includes CAD in her mother; Cancer in her sister; Dementia in her father; Diabetes in her sister; Hypertension in her sister.   ROS:   Please see the history of present illness.    Easy bruising, muscle pain, back pain, hearing loss, and vision disturbance. All other systems reviewed and are negative.   PHYSICAL EXAM:   VS:  BP (!) 162/66   Pulse (!) 55   Ht 5\' 7"  (1.702 m)   Wt 163 lb 6.4 oz (74.1 kg)   BMI 25.59 kg/m    GEN: Well nourished, well developed, in no acute distress  HEENT: normal  Neck: no JVD.  Right carotid bruit with pulsatile mass in the right supraclavicular fossa. Cardiac: RRR; no murmurs, rubs, or gallops,no edema  Respiratory:  clear to auscultation bilaterally, normal work of breathing GI: soft, nontender, nondistended, + BS MS: no deformity or atrophy  Skin: warm and dry, no rash Neuro:  Alert and Oriented x 3, Strength and sensation are intact Psych: euthymic mood, full affect  Wt Readings from Last 3 Encounters:  10/13/17 163 lb 6.4 oz (74.1 kg)  07/06/17 158 lb 4 oz (71.8 kg)  12/27/16 150 lb (68 kg)      Studies/Labs Reviewed:   EKG:  EKG sinus bradycardia 55 bpm, left bundle branch block with QRS  duration 152 ms.  When compared to prior tracing, no changes occurred.  Recent Labs: 07/06/2017: ALT 9; BUN 20; Creatinine, Ser 1.04; Hemoglobin 13.9; Platelets 216.0; Potassium 3.9; Sodium 141; TSH 4.24   Lipid Panel    Component Value Date/Time   CHOL 194 07/06/2017 1300   TRIG 131.0 07/06/2017 1300   HDL 56.20 07/06/2017 1300   CHOLHDL 3 07/06/2017 1300   VLDL 26.2 07/06/2017 1300   LDLCALC 111 (H) 07/06/2017 1300    Additional studies/ records  that were reviewed today include:  No prior vascular Doppler studies.    ASSESSMENT:    1. LBBB (left bundle branch block)   2. Chronic diastolic HF (heart failure) ([place])   3. Essential hypertension   4. Supraclavicular mass   5. History of CVA (cerebrovascular accident)      PLAN:  In order of problems listed above:  1. No change 2. No evidence of volume overload.  Last LVEF was 50%. 3. Blood pressure is elevated but she has not taken any medication yet today.  Blood pressure target is 130/80 mmHg or less. 4. This pulsatile mass could represent an innominate or carotid aneurysm.  She will need to have a Doppler/duplex performed in the right supraclavicular fossa to exclude aneurysm formation. 5. No new or recurrent neurological complaints.  Clinical follow-up in 1 year.  Secondary prevention with appropriate lipid lowering (LDL less than 70), target blood pressure 130/80 mmHg, aerobic activity, and weight control.    Medication Adjustments/Labs and Tests Ordered: Current medicines are reviewed at length with the patient today.  Concerns regarding medicines are outlined above.  Medication changes, Labs and Tests ordered today are listed in the Patient Instructions below. There are no Patient Instructions on file for this visit.   Signed, Sinclair Grooms, MD  10/13/2017 11:14 AM    [address], [place], [place]  [address] Phone: [phone]; Fax: [phone]

## 2017-10-13 ENCOUNTER — Ambulatory Visit (INDEPENDENT_AMBULATORY_CARE_PROVIDER_SITE_OTHER): Payer: Medicare Other | Admitting: Interventional Cardiology

## 2017-10-13 ENCOUNTER — Encounter: Payer: Self-pay | Admitting: Interventional Cardiology

## 2017-10-13 VITALS — BP 162/66 | HR 55 | Ht 67.0 in | Wt 163.4 lb

## 2017-10-13 DIAGNOSIS — Z8673 Personal history of transient ischemic attack (TIA), and cerebral infarction without residual deficits: Secondary | ICD-10-CM

## 2017-10-13 DIAGNOSIS — R222 Localized swelling, mass and lump, trunk: Secondary | ICD-10-CM | POA: Diagnosis not present

## 2017-10-13 DIAGNOSIS — I10 Essential (primary) hypertension: Secondary | ICD-10-CM

## 2017-10-13 DIAGNOSIS — I5032 Chronic diastolic (congestive) heart failure: Secondary | ICD-10-CM

## 2017-10-13 DIAGNOSIS — I447 Left bundle-branch block, unspecified: Secondary | ICD-10-CM | POA: Diagnosis not present

## 2017-10-13 NOTE — Patient Instructions (Signed)
Medication Instructions:  Your physician recommends that you continue on your current medications as directed. Please refer to the Current Medication list given to you today.  Labwork: None  Testing/Procedures: Your physician has requested that you have a carotid duplex. This test is an ultrasound of the carotid arteries in your neck. It looks at blood flow through these arteries that supply the brain with blood. Allow one hour for this exam. There are no restrictions or special instructions.  Your physician has requested that you have an upper extremity arterial duplex. This test is an ultrasound of the arteries in the arms. It looks at arterial blood flow in the arms. Allow one hour for Upper Arterial scans. There are no restrictions or special instructions'   Follow-Up: Your physician wants you to follow-up in: 1 year with Dr. Tamala Julian. You will receive a reminder letter in the mail two months in advance. If you don't receive a letter, please call our office to schedule the follow-up appointment.   Any Other Special Instructions Will Be Listed Below (If Applicable).     If you need a refill on your cardiac medications before your next appointment, please call your pharmacy.

## 2017-10-25 ENCOUNTER — Ambulatory Visit (INDEPENDENT_AMBULATORY_CARE_PROVIDER_SITE_OTHER): Payer: Medicare Other

## 2017-10-25 DIAGNOSIS — E538 Deficiency of other specified B group vitamins: Secondary | ICD-10-CM

## 2017-10-25 MED ORDER — CYANOCOBALAMIN 1000 MCG/ML IJ SOLN
1000.0000 ug | Freq: Once | INTRAMUSCULAR | Status: AC
  Administered 2017-10-25: 1000 ug via INTRAMUSCULAR

## 2017-10-27 ENCOUNTER — Encounter (HOSPITAL_COMMUNITY): Payer: Medicare Other

## 2017-10-27 ENCOUNTER — Ambulatory Visit (HOSPITAL_COMMUNITY)
Admission: RE | Admit: 2017-10-27 | Discharge: 2017-10-27 | Disposition: A | Discharge status: Home / Self Care | Payer: Medicare Other | Source: Ambulatory Visit | Attending: Cardiovascular Disease | Admitting: Cardiovascular Disease

## 2017-10-27 ENCOUNTER — Other Ambulatory Visit (HOSPITAL_COMMUNITY): Payer: Medicare Other

## 2017-10-27 ENCOUNTER — Inpatient Hospital Stay (HOSPITAL_COMMUNITY)
Admission: RE | Admit: 2017-10-27 | Payer: Medicare Other | Source: Ambulatory Visit | Attending: Interventional Cardiology | Admitting: Interventional Cardiology

## 2017-10-27 DIAGNOSIS — R222 Localized swelling, mass and lump, trunk: Secondary | ICD-10-CM | POA: Diagnosis not present

## 2017-10-27 DIAGNOSIS — I6523 Occlusion and stenosis of bilateral carotid arteries: Secondary | ICD-10-CM | POA: Insufficient documentation

## 2017-11-03 ENCOUNTER — Telehealth: Payer: Self-pay | Admitting: Interventional Cardiology

## 2017-11-03 DIAGNOSIS — R9389 Abnormal findings on diagnostic imaging of other specified body structures: Secondary | ICD-10-CM

## 2017-11-03 NOTE — Telephone Encounter (Signed)
New message ° °Pt verbalized that she is returning call for RN °

## 2017-11-03 NOTE — Telephone Encounter (Signed)
Notes recorded by [name], MD on 10/28/2017 at 8:13 AM EDT Let the patient know there is enlargement of the right common carotid. She needs to have a carotid CT angios to rule out aneurysm of the right common carotid artery. A copy will be sent to [name], MD  Spoke with pt and went over results.  Pt verbalized understanding and was in agreement with this plan.

## 2017-11-08 ENCOUNTER — Other Ambulatory Visit: Payer: Self-pay | Admitting: *Deleted

## 2017-11-08 DIAGNOSIS — I10 Essential (primary) hypertension: Secondary | ICD-10-CM

## 2017-11-08 DIAGNOSIS — I5032 Chronic diastolic (congestive) heart failure: Secondary | ICD-10-CM

## 2017-11-10 ENCOUNTER — Other Ambulatory Visit: Payer: Medicare Other | Admitting: *Deleted

## 2017-11-10 DIAGNOSIS — I5032 Chronic diastolic (congestive) heart failure: Secondary | ICD-10-CM | POA: Diagnosis not present

## 2017-11-10 DIAGNOSIS — I10 Essential (primary) hypertension: Secondary | ICD-10-CM | POA: Diagnosis not present

## 2017-11-10 LAB — BASIC METABOLIC PANEL
BUN / CREAT RATIO: 18 (ref 12–28)
BUN: 17 mg/dL (ref 8–27)
CHLORIDE: 105 mmol/L (ref 96–106)
CO2: 25 mmol/L (ref 20–29)
Calcium: 9.1 mg/dL (ref 8.7–10.3)
Creatinine, Ser: 0.95 mg/dL (ref 0.57–1.00)
GFR calc non Af Amer: 55 mL/min/{1.73_m2} — ABNORMAL LOW (ref >59)
GFR, EST AFRICAN AMERICAN: 63 mL/min/{1.73_m2} (ref >59)
GLUCOSE: 88 mg/dL (ref 65–99)
Potassium: 4.8 mmol/L (ref 3.5–5.2)
SODIUM: 142 mmol/L (ref 134–144)

## 2017-11-17 ENCOUNTER — Ambulatory Visit (INDEPENDENT_AMBULATORY_CARE_PROVIDER_SITE_OTHER)
Admission: RE | Admit: 2017-11-17 | Discharge: 2017-11-17 | Disposition: A | Discharge status: Home / Self Care | Payer: Medicare Other | Source: Ambulatory Visit | Attending: Interventional Cardiology | Admitting: Interventional Cardiology

## 2017-11-17 DIAGNOSIS — R9389 Abnormal findings on diagnostic imaging of other specified body structures: Secondary | ICD-10-CM

## 2017-11-17 DIAGNOSIS — I6523 Occlusion and stenosis of bilateral carotid arteries: Secondary | ICD-10-CM | POA: Diagnosis not present

## 2017-11-17 MED ORDER — IOPAMIDOL (ISOVUE-370) INJECTION 76%
80.0000 mL | Freq: Once | INTRAVENOUS | Status: AC | PRN
  Administered 2017-11-17: 80 mL via INTRAVENOUS

## 2017-11-29 ENCOUNTER — Ambulatory Visit (INDEPENDENT_AMBULATORY_CARE_PROVIDER_SITE_OTHER): Payer: Medicare Other

## 2017-11-29 DIAGNOSIS — E538 Deficiency of other specified B group vitamins: Secondary | ICD-10-CM | POA: Diagnosis not present

## 2017-11-29 MED ORDER — CYANOCOBALAMIN 1000 MCG/ML IJ SOLN
1000.0000 ug | Freq: Once | INTRAMUSCULAR | Status: AC
  Administered 2017-11-29: 1000 ug via INTRAMUSCULAR

## 2017-12-23 ENCOUNTER — Ambulatory Visit: Payer: Self-pay | Admitting: *Deleted

## 2017-12-23 DIAGNOSIS — W57XXXA Bitten or stung by nonvenomous insect and other nonvenomous arthropods, initial encounter: Secondary | ICD-10-CM | POA: Diagnosis not present

## 2017-12-23 DIAGNOSIS — S80261A Insect bite (nonvenomous), right knee, initial encounter: Secondary | ICD-10-CM | POA: Diagnosis not present

## 2017-12-23 NOTE — Telephone Encounter (Signed)
I returned her call.   She has had 3 or 4 total tick bites since May 1st.   She removed one this morning a tiny tick.    She removed a larger tick with a white spot on its head this week.    She has a tick bite behind her right knee that is red with a knot under it and pus forming under it, it looks like.  Denies any other symptoms except all the bites itch.  All the providers at [facility] were booked today so she is going to the [employer] [facility] at [facility] this morning.   Reason for Disposition . [1] Probable deer tick AND [2] attached > 36 hours (or tick appears swollen, not flat) AND     [3] occurred in an area where Lyme disease is common  Answer Assessment - Initial Assessment Questions 1. TYPE of TICK: "Is it a wood tick or a deer tick?" If unsure, ask: "What size was the tick?" "Did it look more like a watermelon seed or a poppy seed?"      Bigger tick with white mark on head.   I've had 3 other tick bites this month that were very tiny ticks. 2. LOCATION: "Where is the tick bite located?"      Behind my right knee.   It's red with a knot behind it.   It looks like pus is forming under it.   3. ONSET: "How long do you think the tick was attached before you removed it?" (Hours or days)      I've been working in the yard every day.    All 4 bites have been itching.  I found a tiny tick this morning too.    I'm not using bug repellant. 4. TETANUS: "When was the last tetanus booster?"      I'm not sure.   It's been within 10 years. 5. PREGNANCY: "Is there any chance you are pregnant?" "When was your last menstrual period?"     Not asked due to age  Protocols used: TICK BITE-A-AH

## 2017-12-30 ENCOUNTER — Encounter: Payer: Self-pay | Admitting: Internal Medicine

## 2017-12-30 ENCOUNTER — Ambulatory Visit (INDEPENDENT_AMBULATORY_CARE_PROVIDER_SITE_OTHER): Payer: Medicare Other | Admitting: Internal Medicine

## 2017-12-30 VITALS — BP 118/70 | HR 67 | Temp 97.9°F | Ht 67.0 in | Wt 162.0 lb

## 2017-12-30 DIAGNOSIS — B029 Zoster without complications: Secondary | ICD-10-CM

## 2017-12-30 DIAGNOSIS — B019 Varicella without complication: Secondary | ICD-10-CM | POA: Diagnosis not present

## 2017-12-30 DIAGNOSIS — E538 Deficiency of other specified B group vitamins: Secondary | ICD-10-CM | POA: Diagnosis not present

## 2017-12-30 MED ORDER — CYANOCOBALAMIN 1000 MCG/ML IJ SOLN
1000.0000 ug | Freq: Once | INTRAMUSCULAR | Status: AC
  Administered 2017-12-30: 1000 ug via INTRAMUSCULAR

## 2017-12-30 NOTE — Addendum Note (Signed)
Addended by: Pilar Grammes on: 12/30/2017 12:43 PM   Modules accepted: Orders

## 2017-12-30 NOTE — Patient Instructions (Signed)
It is okay to stop the doxycycline.

## 2017-12-30 NOTE — Assessment & Plan Note (Signed)
Seems to have attenuated course due to past zostavax All crusted and no new lesions Discussed antivirals---probably not worth it

## 2017-12-30 NOTE — Progress Notes (Signed)
Subjective:    Patient ID: Leslie [name], female    DOB: [birth year], 84 y.o.   MRN: [id number]  HPI Here with concern about shingles Reviewed visit from 1 week ago Tick bite behind right knee Redness is gone now This seems better  Rash on back started about 4 days ago Very "uncomfortable"---itches, burns No Rx for this  Current Outpatient Medications on File Prior to Visit  Medication Sig Dispense Refill  . Calcium Carbonate-Vit D-Min (CALCIUM 1200 PO) Take 1 tablet by mouth daily.    . clopidogrel (PLAVIX) 75 MG tablet Take 1 tablet (75 mg total) by mouth daily. 90 tablet 3  . cyanocobalamin (,VITAMIN B-12,) 1000 MCG/ML injection Inject 1,000 mcg into the muscle every 30 (thirty) days.    Marland Kitchen doxycycline (VIBRAMYCIN) 100 MG capsule Take 100 mg by mouth 2 (two) times daily.  0  . escitalopram (LEXAPRO) 10 MG tablet Take 0.5 tablets (5 mg total) by mouth daily. 30 tablet 6  . furosemide (LASIX) 20 MG tablet Take 1 tablet (20 mg total) by mouth every morning. 30 tablet 11  . levothyroxine (SYNTHROID, LEVOTHROID) 75 MCG tablet Take 1 tablet (75 mcg total) by mouth daily. 90 tablet 3  . metoprolol succinate (TOPROL-XL) 50 MG 24 hr tablet Take 1 tablet (50 mg total) by mouth every morning. Take with or immediately following a meal. 30 tablet 5  . quinapril (ACCUPRIL) 40 MG tablet Take 20 mg by mouth daily.      No current facility-administered medications on file prior to visit.     Allergies  Allergen Reactions  . Sulfa Antibiotics Other (See Comments)    "urine crystallizes"  . Dilaudid [Hydromorphone Hcl] Nausea Only       . Xopenex [Levalbuterol Hcl] Other (See Comments)    "gives me the shakes"    Past Medical History:  Diagnosis Date  . Anemia   . Anxiety   . Arthritis   . Bundle branch block left   . CHF (congestive heart failure) (HCC)    chronic combine CHF  . Complication of anesthesia    " stomach did not wake up after back surgery "   . Corneal burn    left  eye-is almost healed"Cascade pod"  . Depression   . GERD (gastroesophageal reflux disease)    tums  . Headache    hx of migraines   . Heart failure, left-sided ([place])   . Hypertension   . Hypothyroidism   . MI (myocardial infarction) ([place])   . Nonischemic cardiomyopathy ([place])    '01 EF 30% with normal coronaries; EF 45-50% 03/2012  . Shortness of breath    hx of nothing current on 01/17/2015   . Stroke [facility])    blurred vision residual    Past Surgical History:  Procedure Laterality Date  . CARDIAC CATHETERIZATION     13 yrs. ago  . CATARACT EXTRACTION, BILATERAL Bilateral   . detached retinia    . DILATION AND CURETTAGE OF UTERUS    . EYE SURGERY     bilateral cataracts  . HERNIA REPAIR     hiatial  . hiatal hernia surgery     . LUMBAR LAMINECTOMY/DECOMPRESSION MICRODISCECTOMY  05/17/2012   Procedure: LUMBAR LAMINECTOMY/DECOMPRESSION MICRODISCECTOMY 2 LEVELS;  Surgeon: Ophelia Charter, MD;  Location: [facility];  Service: Neurosurgery;  Laterality: N/A;  Lumbar four laminectomy with bilateral Lumbar three laminotomies and removal of synovial cyst  . TONSILLECTOMY    . TOTAL KNEE ARTHROPLASTY  Left 01/21/2015   Procedure: LEFT TOTAL KNEE ARTHROPLASTY;  Surgeon: Paralee Cancel, MD;  Location: [facility];  Service: Orthopedics;  Laterality: Left;  . TOTAL KNEE ARTHROPLASTY Right 07/22/2015   Procedure: RIGHT TOTAL KNEE ARTHROPLASTY;  Surgeon: Paralee Cancel, MD;  Location: [facility];  Service: Orthopedics;  Laterality: Right;    Family History  Problem Relation Age of Onset  . CAD Mother   . Dementia Father   . Hypertension Sister   . Diabetes Sister   . Cancer Sister     Social History   Socioeconomic History  . Marital status: Widowed    Spouse name: Not on file  . Number of children: 1  . Years of education: Not on file  . Highest education level: Not on file  Occupational History  . 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 Financial resource strain:  Not on file  . Food insecurity:    Worry: Not on file    Inability: Not on file  . Transportation needs:    Medical: Not on file    Non-medical: Not on file  Tobacco Use  . Smoking status: Never Smoker  . Smokeless tobacco: Never Used  Substance and Sexual Activity  . Alcohol use: No  . Drug use: No  . Sexual activity: Never  Lifestyle  . Physical activity:    Days per week: Not on file    Minutes per session: Not on file  . Stress: Not on file  Relationships  . Social connections:    Talks on phone: Not on file    Gets together: Not on file    Attends religious service: Not on file    Active member of club or organization: Not on file    Attends meetings of clubs or organizations: Not on file    Relationship status: Not on file  . Intimate partner violence:    Fear of current or ex partner: Not on file    Emotionally abused: Not on file    Physically abused: Not on file    Forced sexual activity: Not on file  Other Topics Concern  . Not on file  Social History Narrative   Widowed 2015   1 adopted son      Has living will   Son, then nephew [name], are health care POAs   Not sure about DNR--probably would accept for now   No machine or tube feeds if cognitively unaware   Review of Systems No fever No vomiting or diarrhea Eating okay    Objective:   Physical Exam  Constitutional: No distress.  Skin:  Cluster of crusted lesions on medial right buttock Nothing anteriorly No new lesions  Small nodule at border of right popliteal fossa No redness or inflammation           Assessment & Plan:

## 2018-01-03 ENCOUNTER — Encounter: Payer: Medicare Other | Admitting: Internal Medicine

## 2018-01-13 ENCOUNTER — Ambulatory Visit (INDEPENDENT_AMBULATORY_CARE_PROVIDER_SITE_OTHER): Payer: Medicare Other | Admitting: Internal Medicine

## 2018-01-13 ENCOUNTER — Encounter: Payer: Self-pay | Admitting: Internal Medicine

## 2018-01-13 VITALS — BP 140/78 | HR 60 | Temp 97.6°F | Ht 65.5 in | Wt 160.0 lb

## 2018-01-13 DIAGNOSIS — I5032 Chronic diastolic (congestive) heart failure: Secondary | ICD-10-CM

## 2018-01-13 DIAGNOSIS — N183 Chronic kidney disease, stage 3 unspecified: Secondary | ICD-10-CM

## 2018-01-13 DIAGNOSIS — N1832 Chronic kidney disease, stage 3b: Secondary | ICD-10-CM | POA: Insufficient documentation

## 2018-01-13 DIAGNOSIS — K2100 Gastro-esophageal reflux disease with esophagitis, without bleeding: Secondary | ICD-10-CM

## 2018-01-13 DIAGNOSIS — N1831 Chronic kidney disease, stage 3a: Secondary | ICD-10-CM | POA: Insufficient documentation

## 2018-01-13 DIAGNOSIS — F39 Unspecified mood [affective] disorder: Secondary | ICD-10-CM

## 2018-01-13 DIAGNOSIS — Z7189 Other specified counseling: Secondary | ICD-10-CM | POA: Insufficient documentation

## 2018-01-13 DIAGNOSIS — Z0000 Encounter for general adult medical examination without abnormal findings: Secondary | ICD-10-CM | POA: Insufficient documentation

## 2018-01-13 DIAGNOSIS — I10 Essential (primary) hypertension: Secondary | ICD-10-CM

## 2018-01-13 DIAGNOSIS — I739 Peripheral vascular disease, unspecified: Secondary | ICD-10-CM

## 2018-01-13 DIAGNOSIS — I779 Disorder of arteries and arterioles, unspecified: Secondary | ICD-10-CM | POA: Insufficient documentation

## 2018-01-13 DIAGNOSIS — Z23 Encounter for immunization: Secondary | ICD-10-CM

## 2018-01-13 MED ORDER — TETANUS-DIPHTHERIA TOXOIDS TD 5-2 LFU IM INJ: 0.5000 mL | INJECTION | Freq: Once | INTRAMUSCULAR | 0 refills | Status: AC

## 2018-01-13 MED ORDER — QUINAPRIL HCL 40 MG PO TABS: 20.0000 mg | ORAL_TABLET | Freq: Every day | ORAL | 3 refills | Status: DC

## 2018-01-13 NOTE — Assessment & Plan Note (Signed)
BP Readings from Last 3 Encounters:  01/13/18 140/78  12/30/17 118/70  10/13/17 (!) 162/66   Good control

## 2018-01-13 NOTE — Assessment & Plan Note (Signed)
Fibromuscular dysplasia without worrisome stenosis No action needed

## 2018-01-13 NOTE — Assessment & Plan Note (Signed)
Mild now Will continue the escitalopram--low dose

## 2018-01-13 NOTE — Assessment & Plan Note (Signed)
I have personally reviewed the Medicare Annual Wellness questionnaire and have noted 1. The patient's medical and social history 2. Their use of alcohol, tobacco or illicit drugs 3. Their current medications and supplements 4. The patient's functional ability including ADL's, fall risks, home safety risks and hearing or visual             impairment. 5. Diet and physical activities 6. Evidence for depression or mood disorders  The patients weight, height, BMI and visual acuity have been recorded in the chart I have made referrals, counseling and provided education to the patient based review of the above and I have provided the pt with a written personalized care plan for preventive services.  I have provided you with a copy of your personalized plan for preventive services. Please take the time to review along with your updated medication list.  Will give pneumovax booster Rx for Td No shingrix since just had zoster infection Discussed exercise

## 2018-01-13 NOTE — Progress Notes (Signed)
Subjective:    Patient ID: Leslie [name], female    DOB: 1930/10/12, 84 y.o.   MRN: [id number]  HPI Here for Medicare wellness visit and follow up of chronic health conditions Reviewed form and advanced directives Reviewed other doctors No alcohol or tobacco No set exercise  --but stays active Vision is "awful"---okay with glasses Poor hearing--hasn't gotten around to considering hearing aides Has fallen--with injury Mood is generally okay Independent with instrumental ADLs Some trouble with word finding---no functional problems  Has kept up with cardiologist Recent CT angio showed fibromuscular dysplasia  No major stenosis Atherosclerosis also  [place] this week Right arm is bruised up Had fractured ankle--sitting in waiting room at [facility]-----got up and it gave out and was broken again  No chest pain No SOB No sig change in exercise tolerance No dizziness or syncope No palpitations Not weighing herself  Mood is "not that bad" Has social outings, enjoys things 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 Calcium Carbonate-Vit D-Min (CALCIUM 1200 PO) Take 1 tablet by mouth daily.    . clopidogrel (PLAVIX) 75 MG tablet Take 1 tablet (75 mg total) by mouth daily. 90 tablet 3  . cyanocobalamin (,VITAMIN B-12,) 1000 MCG/ML injection Inject 1,000 mcg into the muscle every 30 (thirty) days.    Marland Kitchen escitalopram (LEXAPRO) 10 MG tablet Take 0.5 tablets (5 mg total) by mouth daily. 30 tablet 6  . furosemide (LASIX) 20 MG tablet Take 1 tablet (20 mg total) by mouth every morning. 30 tablet 11  . levothyroxine (SYNTHROID, LEVOTHROID) 75 MCG tablet Take 1 tablet (75 mcg total) by mouth daily. 90  tablet 3  . metoprolol succinate (TOPROL-XL) 50 MG 24 hr tablet Take 1 tablet (50 mg total) by mouth every morning. Take with or immediately following a meal. 30 tablet 5  . quinapril (ACCUPRIL) 40 MG tablet Take 20 mg by mouth daily.      No current facility-administered medications on file prior to visit.     Allergies  Allergen Reactions  . Sulfa Antibiotics Other (See Comments)    "urine crystallizes"  . Dilaudid [Hydromorphone Hcl] Nausea Only       . Xopenex [Levalbuterol Hcl] Other (See Comments)    "gives me the shakes"    Past Medical History:  Diagnosis Date  . Anemia   . Anxiety   . Arthritis   . Bundle branch block left   . CHF (congestive heart failure) (HCC)    chronic combine CHF  . Complication of anesthesia    " stomach did not wake up after back surgery "   . Corneal burn    left eye-is almost healed"Cascade pod"  . Depression   . GERD (gastroesophageal reflux disease)    tums  . Headache    hx of migraines   . Heart failure, left-sided ([place])   . Hypertension   . Hypothyroidism   . MI (myocardial infarction) ([place])   . Nonischemic cardiomyopathy ([place])    '01 EF 30% with normal coronaries; EF 45-50% 03/2012  . Shortness of breath    hx of nothing current on 01/17/2015   . Stroke [facility])    blurred vision residual  Past Surgical History:  Procedure Laterality Date  . CARDIAC CATHETERIZATION     13 yrs. ago  . CATARACT EXTRACTION, BILATERAL Bilateral   . detached retinia    . DILATION AND CURETTAGE OF UTERUS    . EYE SURGERY     bilateral cataracts  . HERNIA REPAIR     hiatial  . hiatal hernia surgery     . LUMBAR LAMINECTOMY/DECOMPRESSION MICRODISCECTOMY  05/17/2012   Procedure: LUMBAR LAMINECTOMY/DECOMPRESSION MICRODISCECTOMY 2 LEVELS;  Surgeon: Ophelia Charter, MD;  Location: [facility];  Service: Neurosurgery;  Laterality: N/A;  Lumbar four laminectomy with bilateral Lumbar three laminotomies and removal of synovial cyst  . TONSILLECTOMY     . TOTAL KNEE ARTHROPLASTY Left 01/21/2015   Procedure: LEFT TOTAL KNEE ARTHROPLASTY;  Surgeon: Paralee Cancel, MD;  Location: [facility];  Service: Orthopedics;  Laterality: Left;  . TOTAL KNEE ARTHROPLASTY Right 07/22/2015   Procedure: RIGHT TOTAL KNEE ARTHROPLASTY;  Surgeon: Paralee Cancel, MD;  Location: [facility];  Service: Orthopedics;  Laterality: Right;    Family History  Problem Relation Age of Onset  . CAD Mother   . Dementia Father   . Hypertension Sister   . Diabetes Sister   . Cancer Sister     Social History   Socioeconomic History  . Marital status: Widowed    Spouse name: Not on file  . Number of children: 1  . Years of education: Not on file  . Highest education level: Not on file  Occupational History  . 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 Financial resource strain: Not on file  . Food insecurity:    Worry: Not on file    Inability: Not on file  . Transportation needs:    Medical: Not on file    Non-medical: Not on file  Tobacco Use  . Smoking status: Never Smoker  . Smokeless tobacco: Never Used  Substance and Sexual Activity  . Alcohol use: No  . Drug use: No  . Sexual activity: Never  Lifestyle  . Physical activity:    Days per week: Not on file    Minutes per session: Not on file  . Stress: Not on file  Relationships  . Social connections:    Talks on phone: Not on file    Gets together: Not on file    Attends religious service: Not on file    Active member of club or organization: Not on file    Attends meetings of clubs or organizations: Not on file    Relationship status: Not on file  . Intimate partner violence:    Fear of current or ex partner: Not on file    Emotionally abused: Not on file    Physically abused: Not on file    Forced sexual activity: Not on file  Other Topics Concern  . Not on file  Social History Narrative   Widowed 2015   1 adopted son      Has living will   Son, then nephew Leslie [name],  are health care POAs   Not sure about DNR--probably would accept for now   No machine or tube feeds if cognitively unaware   Review of Systems Appetite is fair Lost weight with hiatal hernia surgery---has gained it back now Sleep is still not great---stopped the lorazepam Got over the shingles Wears seat belt Teeth are okay. Keeps up with dentist Bowels fine. No blood No urinary problems Some heartburn if eats certain  food. Uses nexium prn. Rare dysphagia if eats certain meats Some pain in hands---doesn't take anything No skin lesions of concern    Objective:   Physical Exam  Constitutional: She is oriented to person, place, and time. She appears well-developed. No distress.  HENT:  Mouth/Throat: Oropharynx is clear and moist. No oropharyngeal exudate.  Neck: No thyromegaly present.  Cardiovascular: Normal rate, regular rhythm, normal heart sounds and intact distal pulses. Exam reveals no gallop.  No murmur heard. Respiratory: Effort normal and breath sounds normal. No respiratory distress. She has no wheezes. She has no rales.  GI: Soft. There is no tenderness.  Musculoskeletal: She exhibits no edema or tenderness.  Lymphadenopathy:    She has no cervical adenopathy.  Neurological: She is alert and oriented to person, place, and time.  President--- "Dwaine Deter, Bush" [phone] [id number] Recall 3/3  Skin: No rash noted. No erythema.  Psychiatric: She has a normal mood and affect. Her behavior is normal.           Assessment & Plan:

## 2018-01-13 NOTE — Assessment & Plan Note (Signed)
Compensated On ACEI and furosemide Recent labs okay No action

## 2018-01-13 NOTE — Assessment & Plan Note (Signed)
See social history 

## 2018-01-13 NOTE — Addendum Note (Signed)
Addended by: Pilar Grammes on: 01/13/2018 03:13 PM   Modules accepted: Orders

## 2018-01-13 NOTE — Assessment & Plan Note (Signed)
Better overall since hiatal hernia procedures

## 2018-01-13 NOTE — Assessment & Plan Note (Signed)
GFR ~50  On ACEI No action needed

## 2018-01-13 NOTE — Patient Instructions (Signed)
Get the tetanus booster at your pharmacy.

## 2018-01-13 NOTE — Progress Notes (Signed)
Hearing Screening (Inadequate exam)   Method: Audiometry   125Hz  250Hz  500Hz  1000Hz  2000Hz  3000Hz  4000Hz  6000Hz  8000Hz   Right ear:   0 0 0  0    Left ear:   0 0 0  0    Vision Screening Comments: February 2019

## 2018-01-31 ENCOUNTER — Ambulatory Visit (INDEPENDENT_AMBULATORY_CARE_PROVIDER_SITE_OTHER): Payer: Medicare Other | Admitting: Emergency Medicine

## 2018-01-31 DIAGNOSIS — E538 Deficiency of other specified B group vitamins: Secondary | ICD-10-CM | POA: Diagnosis not present

## 2018-01-31 MED ORDER — CYANOCOBALAMIN 1000 MCG/ML IJ SOLN
1000.0000 ug | Freq: Once | INTRAMUSCULAR | Status: AC
  Administered 2018-01-31: 1000 ug via INTRAMUSCULAR

## 2018-01-31 NOTE — Progress Notes (Signed)
Per orders of Dr.[name], injection of B12 given by Elmon Kirschner. Patient tolerated injection well. Right Deltoid

## 2018-02-22 ENCOUNTER — Other Ambulatory Visit: Payer: Self-pay | Admitting: Internal Medicine

## 2018-02-22 NOTE — Telephone Encounter (Signed)
Last fill date unavailable. Not on Med List. Was D/C 10-14-16 as Pt Preference.  Last OV 01-13-18 Next OV 01-18-09

## 2018-03-07 ENCOUNTER — Ambulatory Visit (INDEPENDENT_AMBULATORY_CARE_PROVIDER_SITE_OTHER): Payer: Medicare Other

## 2018-03-07 DIAGNOSIS — E538 Deficiency of other specified B group vitamins: Secondary | ICD-10-CM | POA: Diagnosis not present

## 2018-03-07 MED ORDER — CYANOCOBALAMIN 1000 MCG/ML IJ SOLN
1000.0000 ug | Freq: Once | INTRAMUSCULAR | Status: AC
  Administered 2018-03-07: 1000 ug via INTRAMUSCULAR

## 2018-03-07 NOTE — Progress Notes (Signed)
Sent to Webb Silversmith, NP in Dr Alla German absence. Pt given 75ml in Left Deltoid. Tolerated well.

## 2018-03-20 ENCOUNTER — Other Ambulatory Visit: Payer: Self-pay | Admitting: Internal Medicine

## 2018-03-27 ENCOUNTER — Other Ambulatory Visit: Payer: Self-pay | Admitting: Internal Medicine

## 2018-03-27 NOTE — Telephone Encounter (Signed)
Last filled 02-22-18 #30 Last OV 01-13-18 Next OV 01-19-19

## 2018-04-07 ENCOUNTER — Encounter: Payer: Self-pay | Admitting: Internal Medicine

## 2018-04-07 ENCOUNTER — Ambulatory Visit (INDEPENDENT_AMBULATORY_CARE_PROVIDER_SITE_OTHER): Payer: Medicare Other | Admitting: Internal Medicine

## 2018-04-07 VITALS — BP 124/76 | HR 53 | Temp 97.4°F | Ht 65.5 in | Wt 156.0 lb

## 2018-04-07 DIAGNOSIS — M549 Dorsalgia, unspecified: Secondary | ICD-10-CM | POA: Insufficient documentation

## 2018-04-07 DIAGNOSIS — E538 Deficiency of other specified B group vitamins: Secondary | ICD-10-CM | POA: Diagnosis not present

## 2018-04-07 MED ORDER — CYANOCOBALAMIN 1000 MCG/ML IJ SOLN
1000.0000 ug | Freq: Once | INTRAMUSCULAR | Status: AC
  Administered 2018-04-07: 1000 ug via INTRAMUSCULAR

## 2018-04-07 NOTE — Patient Instructions (Signed)
Let me know if the mass gets bigger in your back--I will send you to a surgeon to check it out.

## 2018-04-07 NOTE — Progress Notes (Signed)
Subjective:    Patient ID: Leslie [name], female    DOB: [birth year], 84 y.o.   MRN: [id number]  HPI Here due to back pain Here with nephew  Noticed a swollen place on back 2-3 weeks ago Friend didn't notice it On right mid back Spoke to nurse---told her to see doctor (not clearly lipoma)  Has increasing pain as the day goes on Trouble sleeping on it--feels bigger then No recent injury  Chronic DOE Not clearly different  Current Outpatient Medications on File Prior to Visit  Medication Sig Dispense Refill  . Calcium Carbonate-Vit D-Min (CALCIUM 1200 PO) Take 1 tablet by mouth daily.    . clopidogrel (PLAVIX) 75 MG tablet Take 1 tablet (75 mg total) by mouth daily. 90 tablet 3  . cyanocobalamin (,VITAMIN B-12,) 1000 MCG/ML injection Inject 1,000 mcg into the muscle every 30 (thirty) days.    Marland Kitchen escitalopram (LEXAPRO) 10 MG tablet Take 0.5 tablets (5 mg total) by mouth daily. 30 tablet 6  . furosemide (LASIX) 20 MG tablet Take 1 tablet (20 mg total) by mouth every morning. 30 tablet 11  . levothyroxine (SYNTHROID, LEVOTHROID) 75 MCG tablet Take 1 tablet (75 mcg total) by mouth daily. 90 tablet 3  . LORazepam (ATIVAN) 1 MG tablet TAKE 0.5-1 TABLETS (0.5-1 MG TOTAL) BY MOUTH AT BEDTIME AS NEEDED. 30 tablet 0  . metoprolol succinate (TOPROL-XL) 50 MG 24 hr tablet TAKE 1 TABLET (50 MG TOTAL) BY MOUTH EVERY MORNING. TAKE WITH OR IMMEDIATELY FOLLOWING A MEAL. 90 tablet 3  . quinapril (ACCUPRIL) 40 MG tablet Take 0.5 tablets (20 mg total) by mouth daily. 45 tablet 3   No current facility-administered medications on file prior to visit.     Allergies  Allergen Reactions  . Sulfa Antibiotics Other (See Comments)    "urine crystallizes"  . Dilaudid [Hydromorphone Hcl] Nausea Only       . Xopenex [Levalbuterol Hcl] Other (See Comments)    "gives me the shakes"    Past Medical History:  Diagnosis Date  . Anemia   . Anxiety   . Arthritis   . Bundle branch block left   . CHF  (congestive heart failure) (HCC)    chronic combine CHF  . Complication of anesthesia    " stomach did not wake up after back surgery "   . Corneal burn    left eye-is almost healed"Cascade pod"  . Depression   . GERD (gastroesophageal reflux disease)    tums  . Headache    hx of migraines   . Heart failure, left-sided ([place])   . Hypertension   . Hypothyroidism   . MI (myocardial infarction) ([place])   . Nonischemic cardiomyopathy ([place])    '01 EF 30% with normal coronaries; EF 45-50% 03/2012  . Shortness of breath    hx of nothing current on 01/17/2015   . Stroke [facility])    blurred vision residual    Past Surgical History:  Procedure Laterality Date  . CARDIAC CATHETERIZATION     13 yrs. ago  . CATARACT EXTRACTION, BILATERAL Bilateral   . detached retinia    . DILATION AND CURETTAGE OF UTERUS    . EYE SURGERY     bilateral cataracts  . HERNIA REPAIR     hiatial  . hiatal hernia surgery     . LUMBAR LAMINECTOMY/DECOMPRESSION MICRODISCECTOMY  05/17/2012   Procedure: LUMBAR LAMINECTOMY/DECOMPRESSION MICRODISCECTOMY 2 LEVELS;  Surgeon: Ophelia Charter, MD;  Location: [facility];  Service:  Neurosurgery;  Laterality: N/A;  Lumbar four laminectomy with bilateral Lumbar three laminotomies and removal of synovial cyst  . TONSILLECTOMY    . TOTAL KNEE ARTHROPLASTY Left 01/21/2015   Procedure: LEFT TOTAL KNEE ARTHROPLASTY;  Surgeon: Paralee Cancel, MD;  Location: [facility];  Service: Orthopedics;  Laterality: Left;  . TOTAL KNEE ARTHROPLASTY Right 07/22/2015   Procedure: RIGHT TOTAL KNEE ARTHROPLASTY;  Surgeon: Paralee Cancel, MD;  Location: [facility];  Service: Orthopedics;  Laterality: Right;    Family History  Problem Relation Age of Onset  . CAD Mother   . Dementia Father   . Hypertension Sister   . Diabetes Sister   . Cancer Sister     Social History   Socioeconomic History  . Marital status: Widowed    Spouse name: Not on file  . Number of children: 1  . Years of education: Not  on file  . Highest education level: Not on file  Occupational History  . 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 Financial resource strain: Not on file  . Food insecurity:    Worry: Not on file    Inability: Not on file  . Transportation needs:    Medical: Not on file    Non-medical: Not on file  Tobacco Use  . Smoking status: Never Smoker  . Smokeless tobacco: Never Used  Substance and Sexual Activity  . Alcohol use: No  . Drug use: No  . Sexual activity: Never  Lifestyle  . Physical activity:    Days per week: Not on file    Minutes per session: Not on file  . Stress: Not on file  Relationships  . Social connections:    Talks on phone: Not on file    Gets together: Not on file    Attends religious service: Not on file    Active member of club or organization: Not on file    Attends meetings of clubs or organizations: Not on file    Relationship status: Not on file  . Intimate partner violence:    Fear of current or ex partner: Not on file    Emotionally abused: Not on file    Physically abused: Not on file    Forced sexual activity: Not on file  Other Topics Concern  . Not on file  Social History Narrative   Widowed 2015   1 adopted son      Has living will   Son, then nephew [name], are health care POAs   Not sure about DNR--probably would accept for now   No machine or tube feeds if cognitively unaware   Review of Systems No fever No cough Appetite is fine    Objective:   Physical Exam  Constitutional: She appears well-developed. No distress.  Musculoskeletal:  No spine tenderness Normal back flexion Soft, non tender mass below right CVA angle--freely moveable SLR negative (though some pain with trunk twisting) Fairly normal ROM in hips  Neurological:  No weakness Normal gait           Assessment & Plan:

## 2018-04-07 NOTE — Progress Notes (Signed)
Monthly B12 Inj given in Rt Deltoid. Pt tolerated well.

## 2018-04-07 NOTE — Addendum Note (Signed)
Addended by: Pilar Grammes on: 04/07/2018 09:08 AM   Modules accepted: Orders

## 2018-04-07 NOTE — Assessment & Plan Note (Signed)
Has apparent lipoma over area of pain Character still seems muscular Discussed heat, tylenol, etc If the mass gets bigger, will send to general surgeon

## 2018-04-13 ENCOUNTER — Ambulatory Visit: Payer: Medicare Other

## 2018-04-14 DIAGNOSIS — H353121 Nonexudative age-related macular degeneration, left eye, early dry stage: Secondary | ICD-10-CM | POA: Diagnosis not present

## 2018-04-18 ENCOUNTER — Other Ambulatory Visit: Payer: Self-pay | Admitting: Internal Medicine

## 2018-04-18 NOTE — Telephone Encounter (Signed)
Copied from [place] [id number]. Topic: Quick Communication - Rx Refill/Question >> Apr 18, 2018  2:27 PM Scherrie Gerlach wrote: Medication: escitalopram (LEXAPRO) 10 MG tablet Vicente Males the pharmacist states pt is saying she takes 1 tab a day now and not 1/2. They are requesting a new Rx reflecting new dosage. [id number] [address], [place] [phone] (Phone) [phone] (Fax)

## 2018-04-18 NOTE — Telephone Encounter (Signed)
Tried to call pt to get clarification. I do not see a note stating she was to increase it.

## 2018-04-20 NOTE — Telephone Encounter (Signed)
Left message for pt to call the office.

## 2018-04-20 NOTE — Telephone Encounter (Signed)
Approved: okay to change back to full tab and send refill x 1 year if needed

## 2018-04-20 NOTE — Telephone Encounter (Signed)
Pt called back, P[facility] routed call to me (triage today). Pt said that last time she was her for an OV with Dr. Silvio Pate they talked about her dropping dose down to 1/2 tab. Pt said she tried taking a 1/2 tab for about a month but she started feeling bad and "off" so she went back to a whole tablet. Pt said she's been taking a whole table for months now and request Rx to be changed back to say that she's taking 1 whole tab not 1/2 tab. Note routed to PCP

## 2018-04-21 MED ORDER — ESCITALOPRAM OXALATE 10 MG PO TABS: 10.0000 mg | ORAL_TABLET | Freq: Every day | ORAL | 11 refills | Status: DC

## 2018-04-22 ENCOUNTER — Other Ambulatory Visit: Payer: Self-pay | Admitting: Internal Medicine

## 2018-04-24 NOTE — Telephone Encounter (Signed)
Last filled 03-27-18 #30 Last OV 04-07-18

## 2018-05-10 ENCOUNTER — Ambulatory Visit (INDEPENDENT_AMBULATORY_CARE_PROVIDER_SITE_OTHER): Payer: Medicare Other

## 2018-05-10 ENCOUNTER — Encounter (INDEPENDENT_AMBULATORY_CARE_PROVIDER_SITE_OTHER): Payer: Self-pay

## 2018-05-10 DIAGNOSIS — E538 Deficiency of other specified B group vitamins: Secondary | ICD-10-CM

## 2018-05-10 DIAGNOSIS — Z23 Encounter for immunization: Secondary | ICD-10-CM | POA: Diagnosis not present

## 2018-05-10 MED ORDER — CYANOCOBALAMIN 1000 MCG/ML IJ SOLN
1000.0000 ug | Freq: Once | INTRAMUSCULAR | Status: AC
  Administered 2018-05-10: 1000 ug via INTRAMUSCULAR

## 2018-05-10 NOTE — Progress Notes (Signed)
Per orders of Dr. Viviana Simpler, injection of B12 given by Lurlean Nanny. Patient tolerated injection well.

## 2018-05-13 ENCOUNTER — Other Ambulatory Visit: Payer: Self-pay | Admitting: Internal Medicine

## 2018-05-15 MED ORDER — CITALOPRAM HYDROBROMIDE 20 MG PO TABS: 20.0000 mg | ORAL_TABLET | Freq: Every day | ORAL | 3 refills | Status: DC

## 2018-05-15 NOTE — Telephone Encounter (Signed)
Approved: okay to change to citalopram 20mg  daily 1 year Rx Have patient notify me if she has any problems with the switch

## 2018-05-15 NOTE — Addendum Note (Signed)
Addended by: Jacqualin Combes on: 05/15/2018 03:58 PM   Modules accepted: Orders

## 2018-05-15 NOTE — Telephone Encounter (Signed)
Spoken and notified patient of Dr Alla German comments. Patient verbalized understanding.

## 2018-05-15 NOTE — Telephone Encounter (Signed)
Note from pharmacy that patient is requesting change from escitalopram  Pharmacy comment: Alternative Requested:PT PAYING $ 47 FOR ESCIT. PT REQUESTING LOWER OUT-OF-POCKET COST. SEE ALTERNATIVES AND ASSOCIATED COST: CITAL $1.

## 2018-05-31 ENCOUNTER — Other Ambulatory Visit: Payer: Self-pay | Admitting: Internal Medicine

## 2018-05-31 NOTE — Telephone Encounter (Signed)
Last filled 04-27-18 #30 Last OV 04-07-18 Next OV 01-19-19

## 2018-06-14 ENCOUNTER — Ambulatory Visit (INDEPENDENT_AMBULATORY_CARE_PROVIDER_SITE_OTHER): Payer: Medicare Other | Admitting: *Deleted

## 2018-06-14 DIAGNOSIS — E538 Deficiency of other specified B group vitamins: Secondary | ICD-10-CM | POA: Diagnosis not present

## 2018-06-14 MED ORDER — CYANOCOBALAMIN 1000 MCG/ML IJ SOLN
1000.0000 ug | Freq: Once | INTRAMUSCULAR | Status: AC
  Administered 2018-06-14: 1000 ug via INTRAMUSCULAR

## 2018-06-14 NOTE — Progress Notes (Signed)
Per orders of Dr. Silvio Pate, injection of Vitamin B12 given by Lauralyn Primes. Patient tolerated injection well.

## 2018-07-03 ENCOUNTER — Other Ambulatory Visit: Payer: Self-pay | Admitting: Internal Medicine

## 2018-07-03 NOTE — Telephone Encounter (Signed)
Last filled 05-31-18 #60 Last OV 04-07-18 Next OV 01-19-19 [facility]

## 2018-07-18 ENCOUNTER — Ambulatory Visit (INDEPENDENT_AMBULATORY_CARE_PROVIDER_SITE_OTHER): Payer: Medicare Other | Admitting: *Deleted

## 2018-07-18 DIAGNOSIS — E538 Deficiency of other specified B group vitamins: Secondary | ICD-10-CM

## 2018-07-18 MED ORDER — CYANOCOBALAMIN 1000 MCG/ML IJ SOLN
1000.0000 ug | Freq: Once | INTRAMUSCULAR | Status: AC
  Administered 2018-07-18: 1000 ug via INTRAMUSCULAR

## 2018-07-18 NOTE — Progress Notes (Signed)
Per orders of Dr. Silvio Pate, injection of b12 given by Modena Nunnery. Patient tolerated injection well.

## 2018-08-07 ENCOUNTER — Other Ambulatory Visit: Payer: Self-pay | Admitting: Internal Medicine

## 2018-08-07 NOTE — Telephone Encounter (Signed)
Last filled 07-03-18 #30 Last OV 04-07-18 Next OV 01-19-19 [facility]

## 2018-08-22 ENCOUNTER — Ambulatory Visit (INDEPENDENT_AMBULATORY_CARE_PROVIDER_SITE_OTHER): Payer: Medicare Other | Admitting: *Deleted

## 2018-08-22 DIAGNOSIS — E538 Deficiency of other specified B group vitamins: Secondary | ICD-10-CM | POA: Diagnosis not present

## 2018-08-22 MED ORDER — CYANOCOBALAMIN 1000 MCG/ML IJ SOLN
1000.0000 ug | Freq: Once | INTRAMUSCULAR | Status: AC
  Administered 2018-08-22: 1000 ug via INTRAMUSCULAR

## 2018-08-22 NOTE — Progress Notes (Signed)
Per orders of Dr. Silvio Pate, injection of b12 given by Modena Nunnery. Patient tolerated injection well.

## 2018-08-24 ENCOUNTER — Other Ambulatory Visit: Payer: Self-pay | Admitting: Internal Medicine

## 2018-09-19 ENCOUNTER — Other Ambulatory Visit: Payer: Self-pay | Admitting: Internal Medicine

## 2018-09-19 NOTE — Telephone Encounter (Signed)
Last filled 08-07-18 #30 Last OV 04-07-18 Next OV 01-19-19 [facility]

## 2018-09-26 ENCOUNTER — Ambulatory Visit (INDEPENDENT_AMBULATORY_CARE_PROVIDER_SITE_OTHER): Payer: Medicare Other

## 2018-09-26 DIAGNOSIS — E538 Deficiency of other specified B group vitamins: Secondary | ICD-10-CM | POA: Diagnosis not present

## 2018-09-26 MED ORDER — CYANOCOBALAMIN 1000 MCG/ML IJ SOLN
1000.0000 ug | Freq: Once | INTRAMUSCULAR | Status: AC
  Administered 2018-09-26: 1000 ug via INTRAMUSCULAR

## 2018-09-26 NOTE — Progress Notes (Signed)
Per orders of Dr. [name], injection of vit B12 given by Tonyetta Berko. Patient tolerated injection well.  

## 2018-10-23 ENCOUNTER — Other Ambulatory Visit: Payer: Self-pay | Admitting: Internal Medicine

## 2018-10-23 NOTE — Telephone Encounter (Signed)
Last filled 09-19-18 #30 Last OV 04-07-18 Next OV 01-19-19 [facility]

## 2018-10-30 ENCOUNTER — Other Ambulatory Visit: Payer: Self-pay | Admitting: Internal Medicine

## 2018-10-30 NOTE — Telephone Encounter (Signed)
Pt want to know if she can get a prescription for a B-12 shot due to her canceling her appt. Please advise.

## 2018-10-30 NOTE — Telephone Encounter (Signed)
Okay to send the prescription for her---maybe she can do this on an ongoing basis?

## 2018-10-30 NOTE — Telephone Encounter (Signed)
Mandy, which syringe and needle have you ordered for pts in the past?

## 2018-10-30 NOTE — Telephone Encounter (Signed)
Spoke to pt. She has a neighbor who is a Marine scientist and she can give the pt her shots. Asked that we send a rx for the B12 and syringes/needles to [facility].

## 2018-10-31 ENCOUNTER — Ambulatory Visit: Payer: Medicare Other

## 2018-10-31 MED ORDER — SYRINGE (DISPOSABLE) 3 ML MISC: 1.0000 | 11 refills | Status: DC

## 2018-10-31 MED ORDER — "NEEDLE (DISP) 25G X 1"" MISC": 1.0000 | 11 refills | Status: DC

## 2018-10-31 MED ORDER — CYANOCOBALAMIN 1000 MCG/ML IJ SOLN: 1000.0000 ug | INTRAMUSCULAR | 11 refills | Status: DC

## 2018-10-31 NOTE — Telephone Encounter (Signed)
I tried to call the pt but her line was busy. I went ahead and pulled up what I thought the pharmacy would approve and sent in the medication, syringes, and needles.

## 2018-10-31 NOTE — Telephone Encounter (Signed)
I have tried to call her multiple times and it keeps ringing busy.

## 2018-10-31 NOTE — Telephone Encounter (Signed)
Pt stating she is waiting on call back concerning if she will get her prescription. She called to pharmacy and they didn't have it. Please call pt

## 2018-11-21 ENCOUNTER — Telehealth: Payer: Self-pay | Admitting: Interventional Cardiology

## 2018-11-21 ENCOUNTER — Other Ambulatory Visit: Payer: Self-pay | Admitting: Internal Medicine

## 2018-11-21 NOTE — Telephone Encounter (Signed)
Attempted to contact pt in regards to appt with Dr. Tamala Julian scheduled for 11/28/2018.  Need to change to a video visit if pt able.  Phone range several times and then call was disconnected.  Will try again later.

## 2018-11-22 NOTE — Telephone Encounter (Signed)
Last filled 10-23-18 #3- Last OV 04-07-18 Next OV 01-19-19 [facility]

## 2018-11-22 NOTE — Telephone Encounter (Signed)
Attempted to contact pt.  Phone rang several times with no answer and no VM.  Will try again later. °

## 2018-11-23 NOTE — Telephone Encounter (Signed)
Tried calling patient back regarding her appointment , no answer no vm

## 2018-11-23 NOTE — Telephone Encounter (Signed)
No answer no vm , tried to call patient regarding appointment for Dr Tamala Julian on 11/28/18

## 2018-11-24 NOTE — Telephone Encounter (Signed)
Tried calling [name] again this morning about her appointment with Dr Tamala Julian , no answer, no vm

## 2018-11-27 NOTE — Telephone Encounter (Signed)
Pt called back and rescheduled appt to August.

## 2018-11-28 ENCOUNTER — Ambulatory Visit: Payer: Medicare Other | Admitting: Interventional Cardiology

## 2018-12-28 ENCOUNTER — Other Ambulatory Visit: Payer: Self-pay | Admitting: Internal Medicine

## 2018-12-28 NOTE — Telephone Encounter (Signed)
Last filled 11-27-18 #30 Last OV 04-07-18 Next OV 01-19-19 [facility]  Forwarding to Dr Diona Browner in Dr Alla German absence

## 2019-01-19 ENCOUNTER — Encounter: Payer: Medicare Other | Admitting: Internal Medicine

## 2019-01-25 ENCOUNTER — Other Ambulatory Visit: Payer: Self-pay | Admitting: Internal Medicine

## 2019-02-02 ENCOUNTER — Other Ambulatory Visit: Payer: Self-pay | Admitting: Family Medicine

## 2019-02-03 NOTE — Telephone Encounter (Signed)
Last filled 12-29-18 #30 Last OV 04-07-18 Next OV 06-05-19 [facility]

## 2019-02-27 ENCOUNTER — Other Ambulatory Visit: Payer: Self-pay | Admitting: Internal Medicine

## 2019-03-16 ENCOUNTER — Other Ambulatory Visit: Payer: Self-pay | Admitting: Internal Medicine

## 2019-03-16 NOTE — Telephone Encounter (Signed)
Pt called to check on this request. She is out of her medication.

## 2019-03-16 NOTE — Telephone Encounter (Signed)
We just received the request at 12:10 pm.  Last filled 02-05-19 #30 Last OV 04-07-18 Next OV 06-05-19 [facility]

## 2019-03-27 ENCOUNTER — Telehealth: Payer: Self-pay | Admitting: Interventional Cardiology

## 2019-03-27 NOTE — Telephone Encounter (Signed)

## 2019-03-30 ENCOUNTER — Telehealth: Payer: Medicare Other | Admitting: Interventional Cardiology

## 2019-04-13 ENCOUNTER — Other Ambulatory Visit: Payer: Self-pay | Admitting: Internal Medicine

## 2019-04-13 NOTE — Telephone Encounter (Signed)
Last filled 03-16-19 #30 Last OV 04-07-18 Next OV 06-05-19 [facility]  Forwarding to [facility] in Dr Alla German absence.

## 2019-05-22 ENCOUNTER — Other Ambulatory Visit: Payer: Self-pay | Admitting: Internal Medicine

## 2019-05-22 NOTE — Telephone Encounter (Signed)
[name] pt, last filled 04/13/2019 #30, last OV 03/2018 upcoming OV 06/05/2019... please advise

## 2019-05-26 ENCOUNTER — Other Ambulatory Visit: Payer: Self-pay | Admitting: Internal Medicine

## 2019-06-05 ENCOUNTER — Other Ambulatory Visit: Payer: Self-pay

## 2019-06-05 ENCOUNTER — Encounter: Payer: Self-pay | Admitting: Internal Medicine

## 2019-06-05 ENCOUNTER — Ambulatory Visit (INDEPENDENT_AMBULATORY_CARE_PROVIDER_SITE_OTHER): Payer: Medicare Other | Admitting: Internal Medicine

## 2019-06-05 VITALS — BP 136/74 | HR 58 | Temp 98.1°F | Ht 65.5 in | Wt 153.0 lb

## 2019-06-05 DIAGNOSIS — Z0000 Encounter for general adult medical examination without abnormal findings: Secondary | ICD-10-CM

## 2019-06-05 DIAGNOSIS — E538 Deficiency of other specified B group vitamins: Secondary | ICD-10-CM

## 2019-06-05 DIAGNOSIS — I6523 Occlusion and stenosis of bilateral carotid arteries: Secondary | ICD-10-CM | POA: Diagnosis not present

## 2019-06-05 DIAGNOSIS — Z7189 Other specified counseling: Secondary | ICD-10-CM | POA: Diagnosis not present

## 2019-06-05 DIAGNOSIS — D6869 Other thrombophilia: Secondary | ICD-10-CM | POA: Insufficient documentation

## 2019-06-05 DIAGNOSIS — N1831 Chronic kidney disease, stage 3a: Secondary | ICD-10-CM

## 2019-06-05 DIAGNOSIS — E039 Hypothyroidism, unspecified: Secondary | ICD-10-CM | POA: Diagnosis not present

## 2019-06-05 DIAGNOSIS — F39 Unspecified mood [affective] disorder: Secondary | ICD-10-CM | POA: Diagnosis not present

## 2019-06-05 DIAGNOSIS — I5032 Chronic diastolic (congestive) heart failure: Secondary | ICD-10-CM

## 2019-06-05 DIAGNOSIS — Z23 Encounter for immunization: Secondary | ICD-10-CM | POA: Diagnosis not present

## 2019-06-05 LAB — HEPATIC FUNCTION PANEL
ALT: 9 U/L (ref 0–35)
AST: 17 U/L (ref 0–37)
Albumin: 4.2 g/dL (ref 3.5–5.2)
Alkaline Phosphatase: 51 U/L (ref 39–117)
Bilirubin, Direct: 0.1 mg/dL (ref 0.0–0.3)
Total Bilirubin: 0.7 mg/dL (ref 0.2–1.2)
Total Protein: 6.9 g/dL (ref 6.0–8.3)

## 2019-06-05 LAB — RENAL FUNCTION PANEL
Albumin: 4.2 g/dL (ref 3.5–5.2)
BUN: 17 mg/dL (ref 6–23)
CO2: 32 mEq/L (ref 19–32)
Calcium: 9.5 mg/dL (ref 8.4–10.5)
Chloride: 104 mEq/L (ref 96–112)
Creatinine, Ser: 0.95 mg/dL (ref 0.40–1.20)
GFR: 55.67 mL/min — ABNORMAL LOW (ref >60.00)
Glucose, Bld: 98 mg/dL (ref 70–99)
Phosphorus: 3 mg/dL (ref 2.3–4.6)
Potassium: 4.7 mEq/L (ref 3.5–5.1)
Sodium: 140 mEq/L (ref 135–145)

## 2019-06-05 LAB — CBC
HCT: 42.3 % (ref 36.0–46.0)
Hemoglobin: 14 g/dL (ref 12.0–15.0)
MCHC: 33.1 g/dL (ref 30.0–36.0)
MCV: 91.2 fl (ref 78.0–100.0)
Platelets: 197 10*3/uL (ref 150.0–400.0)
RBC: 4.63 Mil/uL (ref 3.87–5.11)
RDW: 13.6 % (ref 11.5–15.5)
WBC: 5.4 10*3/uL (ref 4.0–10.5)

## 2019-06-05 LAB — SEDIMENTATION RATE: Sed Rate: 14 mm/hr (ref 0–30)

## 2019-06-05 NOTE — Addendum Note (Signed)
Addended by: Pilar Grammes on: 06/05/2019 02:43 PM   Modules accepted: Orders

## 2019-06-05 NOTE — Assessment & Plan Note (Signed)
See social history 

## 2019-06-05 NOTE — Progress Notes (Signed)
Subjective:    Patient ID: Leslie [name], female    DOB: [birth year], 85 y.o.   MRN: [id number]  HPI  Here for Medicare wellness visit and follow up of chronic health conditions Reviewed form and advanced directives Reviewed other doctors No alcohol or tobacco Vision "is crazy"----needs prisms for double vision. 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 Calcium Carbonate-Vit D-Min (CALCIUM 1200 PO) Take 1 tablet by mouth daily.    . citalopram (CELEXA) 20 MG tablet TAKE 1 TABLET BY MOUTH EVERY DAY 90 tablet 0  . clopidogrel (PLAVIX) 75 MG tablet TAKE 1 TABLET BY MOUTH EVERY DAY 90 tablet 3  . cyanocobalamin (,VITAMIN B-12,) 1000 MCG/ML injection Inject 1 mL (1,000 mcg total) into the muscle every 30 (thirty) days. 1 mL 11  . furosemide (LASIX) 20 MG tablet TAKE 1 TABLET BY MOUTH EVERY DAY IN THE MORNING 90 tablet 3  . levothyroxine (SYNTHROID, LEVOTHROID) 75 MCG tablet TAKE 1 TABLET BY MOUTH EVERY DAY 90 tablet 3  . LORazepam (ATIVAN) 1 MG tablet TAKE 0.5-1 TABLETS (0.5-1 MG TOTAL) BY MOUTH AT BEDTIME AS NEEDED. 30 tablet 0  .  metoprolol succinate (TOPROL-XL) 50 MG 24 hr tablet TAKE 1 TABLET (50 MG TOTAL) BY MOUTH EVERY MORNING. TAKE WITH OR IMMEDIATELY FOLLOWING A MEAL. 90 tablet 0  . NEEDLE, DISP, 25 G (B-D DISP NEEDLE 25GX1") 25G X 1" MISC 1 each by Does not apply route every 30 (thirty) days. 1 each 11  . quinapril (ACCUPRIL) 40 MG tablet TAKE 0.5 TABLETS BY MOUTH DAILY. 45 tablet 3  . Syringe, Disposable, 3 ML MISC 1 each by Does not apply route every 30 (thirty) days. 1 each 11   No current facility-administered medications on file prior to visit.     Allergies  Allergen Reactions  . Sulfa Antibiotics Other (See Comments)    "urine crystallizes"  . Dilaudid [Hydromorphone Hcl] Nausea Only       . Xopenex [Levalbuterol Hcl] Other (See Comments)    "gives me the shakes"    Past Medical History:  Diagnosis Date  . Anemia   . Anxiety   . Arthritis   . Bundle branch block left   . CHF (congestive heart failure) (HCC)    chronic combine CHF  . Complication of anesthesia    " stomach did not wake up after back surgery "   . Corneal burn    left eye-is almost healed"Cascade pod"  . Depression   . GERD (gastroesophageal reflux disease)    tums  . Headache    hx of migraines   . Heart failure, left-sided ([place])   . Hypertension   .  Hypothyroidism   . MI (myocardial infarction) ([place])   . Nonischemic cardiomyopathy ([place])    '01 EF 30% with normal coronaries; EF 45-50% 03/2012  . Shortness of breath    hx of nothing current on 01/17/2015   . Stroke [facility])    blurred vision residual    Past Surgical History:  Procedure Laterality Date  . CARDIAC CATHETERIZATION     13 yrs. ago  . CATARACT EXTRACTION, BILATERAL Bilateral   . detached retinia    . DILATION AND CURETTAGE OF UTERUS    . EYE SURGERY     bilateral cataracts  . HERNIA REPAIR     hiatial  . hiatal hernia surgery     . LUMBAR LAMINECTOMY/DECOMPRESSION MICRODISCECTOMY  05/17/2012   Procedure: LUMBAR LAMINECTOMY/DECOMPRESSION  MICRODISCECTOMY 2 LEVELS;  Surgeon: Ophelia Charter, MD;  Location: [place] [facility];  Service: Neurosurgery;  Laterality: N/A;  Lumbar four laminectomy with bilateral Lumbar three laminotomies and removal of synovial cyst  . TONSILLECTOMY    . TOTAL KNEE ARTHROPLASTY Left 01/21/2015   Procedure: LEFT TOTAL KNEE ARTHROPLASTY;  Surgeon: Paralee Cancel, MD;  Location: [facility];  Service: Orthopedics;  Laterality: Left;  . TOTAL KNEE ARTHROPLASTY Right 07/22/2015   Procedure: RIGHT TOTAL KNEE ARTHROPLASTY;  Surgeon: Paralee Cancel, MD;  Location: [facility];  Service: Orthopedics;  Laterality: Right;    Family History  Problem Relation Age of Onset  . CAD Mother   . Dementia Father   . Hypertension Sister   . Diabetes Sister   . Cancer Sister     Social History   Socioeconomic History  . Marital status: Widowed    Spouse name: Not on file  . Number of children: 1  . Years of education: Not on file  . Highest education level: Not on file  Occupational History  . 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 Financial resource strain: Not on file  . Food insecurity    Worry: Not on file    Inability: Not on file  . Transportation needs    Medical: Not on file    Non-medical: Not on file  Tobacco Use  . Smoking status: Never Smoker  . Smokeless tobacco: Never Used  Substance and Sexual Activity  . Alcohol use: No  . Drug use: No  . Sexual activity: Never  Lifestyle  . Physical activity    Days per week: Not on file    Minutes per session: Not on file  . Stress: Not on file  Relationships  . Social Herbalist on phone: Not on file    Gets together: Not on file    Attends religious service: Not on file    Active member of club or organization: Not on file    Attends meetings of clubs or organizations: Not on file    Relationship status: Not on file  . Intimate partner violence    Fear of current or ex partner: Not on file    Emotionally abused: Not on file     Physically abused: Not on file    Forced sexual activity: Not on file  Other Topics Concern  . Not on file  Social History Narrative   Widowed 2015   1 adopted son      Has living will   Son, then nephew [name], are health care POAs   Not sure about DNR--probably would accept for now   No machine or tube feeds  if cognitively unaware   Review of Systems Sleep is affected by dreams--not rested at times Sleeps flat and no PND Appetite is good Weight is fairly stable Wears seat belt Teeth are okay---overdue for dentist No rash or suspicious skin lesion Occasional heartburn---takes nexium prn. Rare dysphagia (if takes too big a bite) Bowels move fine. No blood No hematuria or dysuria. Slight leakage---if urge    Objective:   Physical Exam  Constitutional: She is oriented to person, place, and time. She appears well-developed. No distress.  HENT:  Mouth/Throat: Oropharynx is clear and moist. No oropharyngeal exudate.  Neck: No thyromegaly present.  Cardiovascular: Normal rate, regular rhythm, normal heart sounds and intact distal pulses. Exam reveals no gallop.  No murmur heard. Respiratory: Effort normal and breath sounds normal. No respiratory distress. She has no wheezes. She has no rales.  GI: Soft. She exhibits no distension. There is no abdominal tenderness. There is no rebound and no guarding.  Musculoskeletal:        General: No tenderness.     Comments: Apparent lipoma---right lower flank/back  Lymphadenopathy:    She has no cervical adenopathy.  Neurological: She is alert and oriented to person, place, and time.  President--- "Dwaine Deter, [place]" [phone] [id number] Recall 3/3  Skin: No rash noted.  Scattered bruising mostly on arms  Psychiatric: She has a normal mood and affect. Her behavior is normal.           Assessment & Plan:

## 2019-06-05 NOTE — Assessment & Plan Note (Signed)
GFR in 50's Is on ACEI

## 2019-06-05 NOTE — Assessment & Plan Note (Signed)
Still with dysthymia Fair control on SSRI

## 2019-06-05 NOTE — Assessment & Plan Note (Signed)
I have personally reviewed the Medicare Annual Wellness questionnaire and have noted 1. The patient's medical and social history 2. Their use of alcohol, tobacco or illicit drugs 3. Their current medications and supplements 4. The patient's functional ability including ADL's, fall risks, home safety risks and hearing or visual             impairment. 5. Diet and physical activities 6. Evidence for depression or mood disorders  The patients weight, height, BMI and visual acuity have been recorded in the chart I have made referrals, counseling and provided education to the patient based review of the above and I have provided the pt with a written personalized care plan for preventive services.  I have provided you with a copy of your personalized plan for preventive services. Please take the time to review along with your updated medication list.  Flu vaccine today No cancer screening due to age 85 to stay active Consider shingrix at some point

## 2019-06-05 NOTE — Assessment & Plan Note (Signed)
Seems compensated on current therapy DOE is more like back and shoulder soreness No change for now Will check sed rate (suspicious for PMR?)

## 2019-06-05 NOTE — Assessment & Plan Note (Signed)
Past stroke but no current symptoms On plavi

## 2019-06-05 NOTE — Assessment & Plan Note (Signed)
Will recheck labs 

## 2019-06-05 NOTE — Assessment & Plan Note (Signed)
Multiple bruises on plavix Will continue given history os stroke

## 2019-06-05 NOTE — Progress Notes (Signed)
Hearing Screening (Inadequate exam)   Method: Audiometry   125Hz  250Hz  500Hz  1000Hz  2000Hz  3000Hz  4000Hz  6000Hz  8000Hz   Right ear:   0 0 0  0    Left ear:   0 0 40  0    Vision Screening Comments: About November 2020

## 2019-06-05 NOTE — Assessment & Plan Note (Signed)
She is not sure it is helping Will check levels

## 2019-06-07 LAB — T4, FREE: Free T4: 0.96 ng/dL (ref 0.60–1.60)

## 2019-06-07 LAB — VITAMIN B12: Vitamin B-12: 425 pg/mL (ref 211–911)

## 2019-06-07 LAB — TSH: TSH: 2.39 u[IU]/mL (ref 0.35–4.50)

## 2019-06-14 NOTE — Progress Notes (Signed)
Cardiology Office Note:    Date:  06/15/2019   ID:  Leslie [name], DOB 04-11-1931, MRN [id number]  PCP:  [name], MD  Cardiologist:  Sinclair Grooms, MD   Referring MD: [name], MD   Chief Complaint  Patient presents with  . Shortness of Breath    History of Present Illness:    Leslie [name] is a 85 y.o. female with a hx of left bundle branch block, chronic diastolic heart failure (EF greater than 50%), history of CVA, and essential hypertension.   Some mild dyspnea on exertion.  Feels that she is at about the same level of exertional tolerance as last year.  She does feel that she tires more quickly.  Sometimes she has to lie down.  No associated chest discomfort.  She denies edema and orthopnea.  Past Medical History:  Diagnosis Date  . Anemia   . Anxiety   . Arthritis   . Bundle branch block left   . CHF (congestive heart failure) (HCC)    chronic combine CHF  . Complication of anesthesia    " stomach did not wake up after back surgery "   . Corneal burn    left eye-is almost healed"Cascade pod"  . Depression   . GERD (gastroesophageal reflux disease)    tums  . Headache    hx of migraines   . Heart failure, left-sided ([place])   . Hypertension   . Hypothyroidism   . MI (myocardial infarction) ([place])   . Nonischemic cardiomyopathy ([place])    '01 EF 30% with normal coronaries; EF 45-50% 03/2012  . Shortness of breath    hx of nothing current on 01/17/2015   . Stroke [facility])    blurred vision residual    Past Surgical History:  Procedure Laterality Date  . CARDIAC CATHETERIZATION     13 yrs. ago  . CATARACT EXTRACTION, BILATERAL Bilateral   . detached retinia    . DILATION AND CURETTAGE OF UTERUS    . EYE SURGERY     bilateral cataracts  . HERNIA REPAIR     hiatial  . hiatal hernia surgery     . LUMBAR LAMINECTOMY/DECOMPRESSION MICRODISCECTOMY  05/17/2012   Procedure: LUMBAR LAMINECTOMY/DECOMPRESSION MICRODISCECTOMY 2 LEVELS;  Surgeon:  Ophelia Charter, MD;  Location: [place] [facility];  Service: Neurosurgery;  Laterality: N/A;  Lumbar four laminectomy with bilateral Lumbar three laminotomies and removal of synovial cyst  . TONSILLECTOMY    . TOTAL KNEE ARTHROPLASTY Left 01/21/2015   Procedure: LEFT TOTAL KNEE ARTHROPLASTY;  Surgeon: Paralee Cancel, MD;  Location[facility];  Service: Orthopedics;  Laterality: Left;  . TOTAL KNEE ARTHROPLASTY Right 07/22/2015   Procedure: RIGHT TOTAL KNEE ARTHROPLASTY;  Surgeon: Paralee Cancel, MD;  [facility];  Service: Orthopedics;  Laterality: Right;    Current Medications: Current Meds  Medication Sig  . Calcium Carbonate-Vit D-Min (CALCIUM 1200 PO) Take 1 tablet by mouth daily.  . citalopram (CELEXA) 20 MG tablet TAKE 1 TABLET BY MOUTH EVERY DAY  . clopidogrel (PLAVIX) 75 MG tablet TAKE 1 TABLET BY MOUTH EVERY DAY  . cyanocobalamin (,VITAMIN B-12,) 1000 MCG/ML injection Inject 1 mL (1,000 mcg total) into the muscle every 30 (thirty) days.  . furosemide (LASIX) 20 MG tablet TAKE 1 TABLET BY MOUTH EVERY DAY IN THE MORNING  . levothyroxine (SYNTHROID, LEVOTHROID) 75 MCG tablet TAKE 1 TABLET BY MOUTH EVERY DAY  . LORazepam (ATIVAN) 1 MG tablet TAKE 0.5-1 TABLETS (0.5-1  MG TOTAL) BY MOUTH AT BEDTIME AS NEEDED.  . metoprolol succinate (TOPROL-XL) 50 MG 24 hr tablet TAKE 1 TABLET (50 MG TOTAL) BY MOUTH EVERY MORNING. TAKE WITH OR IMMEDIATELY FOLLOWING A MEAL.  Marland Kitchen NEEDLE, DISP, 25 G (B-D DISP NEEDLE 25GX1") 25G X 1" MISC 1 each by Does not apply route every 30 (thirty) days.  . quinapril (ACCUPRIL) 40 MG tablet TAKE 0.5 TABLETS BY MOUTH DAILY.  Marland Kitchen Syringe, Disposable, 3 ML MISC 1 each by Does not apply route every 30 (thirty) days.     Allergies:   Sulfa antibiotics, Dilaudid [hydromorphone hcl], and Xopenex [levalbuterol hcl]   Social History   Socioeconomic History  . Marital status: Widowed    Spouse name: Not on file  . Number of children: 1  . Years of education: Not on file  . Highest  education level: Not on file  Occupational History  . 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 Financial resource strain: Not on file  . Food insecurity    Worry: Not on file    Inability: Not on file  . Transportation needs    Medical: Not on file    Non-medical: Not on file  Tobacco Use  . Smoking status: Never Smoker  . Smokeless tobacco: Never Used  Substance and Sexual Activity  . Alcohol use: No  . Drug use: No  . Sexual activity: Never  Lifestyle  . Physical activity    Days per week: Not on file    Minutes per session: Not on file  . Stress: Not on file  Relationships  . Social Herbalist on phone: Not on file    Gets together: Not on file    Attends religious service: Not on file    Active member of club or organization: Not on file    Attends meetings of clubs or organizations: Not on file    Relationship status: Not on file  Other Topics Concern  . Not on file  Social History Narrative   Widowed 2015   1 adopted son      Has living will   Son, then nephew [name], are health care POAs   Not sure about DNR--probably would accept resuscitation for now   No machine or tube feeds if cognitively unaware     Family History: The patient's family history includes CAD in her mother; Cancer in her sister; Dementia in her father; Diabetes in her sister; Hypertension in her sister.  ROS:   Please see the history of present illness.    Arthritis in her hands.  Feels she is getting weaker and frailer.  All other systems reviewed and are negative.  EKGs/Labs/Other Studies Reviewed:    The following studies were reviewed today: 2D Doppler echocardiogram 2016: Study Conclusions  - Left ventricle: The cavity size was normal. There was mild focal   basal hypertrophy of the septum. Systolic function was normal.   The estimated ejection fraction was in the range of 55% to 60%.   Wall motion was normal; there were no regional wall  motion   abnormalities. There was an increased relative contribution of   atrial contraction to ventricular filling. Doppler parameters are   consistent with abnormal left ventricular relaxation (grade 1   diastolic dysfunction). - Ventricular septum: Septal motion showed paradox. These changes   are consistent with intraventricular conduction delay. - Aortic valve: Trileaflet; normal thickness, mildly calcified   leaflets. There was  mild regurgitation. - Mitral valve: Calcified annulus. There was trivial regurgitation. - Tricuspid valve: There was trivial regurgitation. - Pulmonic valve: There was trivial regurgitation.  EKG:  EKG sinus bradycardia, normal PR, left bundle branch block with QRS duration 160 ms.  Recent Labs: 06/05/2019: ALT 9; BUN 17; Creatinine, Ser 0.95; Hemoglobin 14.0; Platelets 197.0; Potassium 4.7; Sodium 140; TSH 2.39  Recent Lipid Panel    Component Value Date/Time   CHOL 194 07/06/2017 1300   TRIG 131.0 07/06/2017 1300   HDL 56.20 07/06/2017 1300   CHOLHDL 3 07/06/2017 1300   VLDL 26.2 07/06/2017 1300   LDLCALC 111 (H) 07/06/2017 1300    Physical Exam:    VS:  BP 136/64   Pulse (!) 54   Ht 5' 5.5" (1.664 m)   Wt 154 lb 12.8 oz (70.2 kg)   SpO2 99%   BMI 25.37 kg/m     Wt Readings from Last 3 Encounters:  06/15/19 154 lb 12.8 oz (70.2 kg)  06/05/19 153 lb (69.4 kg)  04/07/18 156 lb (70.8 kg)     GEN: Compatible with age. No acute distress HEENT: Normal NECK: No JVD. LYMPHATICS: No lymphadenopathy CARDIAC: Right upper sternal border 2/6 systolic murmur.  RRR without diastolic murmur, gallop, or edema. VASCULAR:  Normal Pulses. No bruits. RESPIRATORY:  Clear to auscultation without rales, wheezing or rhonchi  ABDOMEN: Soft, non-tender, non-distended, No pulsatile mass, MUSCULOSKELETAL: No deformity  SKIN: Warm and dry NEUROLOGIC:  Alert and oriented x 3 PSYCHIATRIC:  Normal affect   ASSESSMENT:    1. Chronic diastolic HF (heart  failure) ([place])   2. Essential hypertension   3. LBBB (left bundle branch block)   4. History of CVA (cerebrovascular accident)   5. Educated about COVID-19 virus infection   6. Dyspnea on exertion    PLAN:    In order of problems listed above:  1. No evidence of volume overload.  She is experiencing some dyspnea.  Will perform 2D Doppler echocardiogram to ensure that LV systolic function is remaining stable. 2. Target blood pressure at her age is 140/80 mmHg. 3. Left bundle is unchanged from prior. 4. No neurological complaints. 5. The 3W's are discussed and endorsed. 6. Rule out worsening LV function especially in light of left bundle branch block.     Medication Adjustments/Labs and Tests Ordered: Current medicines are reviewed at length with the patient today.  Concerns regarding medicines are outlined above.  Orders Placed This Encounter  Procedures  . EKG 12-Lead   No orders of the defined types were placed in this encounter.   Patient Instructions  Medication Instructions:  Your physician recommends that you continue on your current medications as directed. Please refer to the Current Medication list given to you today.  *If you need a refill on your cardiac medications before your next appointment, please call your pharmacy*  Lab Work: None If you have labs (blood work) drawn today and your tests are completely normal, you will receive your results only by: Marland Kitchen MyChart Message (if you have MyChart) OR . A paper copy in the mail If you have any lab test that is abnormal or we need to change your treatment, we will call you to review the results.  Testing/Procedures: Your physician has requested that you have an echocardiogram. Echocardiography is a painless test that uses sound waves to create images of your heart. It provides your doctor with information about the size and shape of your heart and how well your heart's  chambers and valves are working. This procedure  takes approximately one hour. There are no restrictions for this procedure.   Follow-Up: At [facility], you and your health needs are our priority.  As part of our continuing mission to provide you with exceptional heart care, we have created designated Provider Care Teams.  These Care Teams include your primary Cardiologist (physician) and Advanced Practice Providers (APPs -  Physician Assistants and Nurse Practitioners) who all work together to provide you with the care you need, when you need it.  Your next appointment:   12 months  The format for your next appointment:   In Person  Provider:   You may see Sinclair Grooms, MD or one of the following Advanced Practice Providers on your designated Care Team:    Truitt Merle, NP  Cecilie Kicks, NP  Kathyrn Drown, NP   Other Instructions      Signed, Sinclair Grooms, MD  06/15/2019 10:51 AM    [place]

## 2019-06-15 ENCOUNTER — Encounter: Payer: Self-pay | Admitting: Interventional Cardiology

## 2019-06-15 ENCOUNTER — Ambulatory Visit (INDEPENDENT_AMBULATORY_CARE_PROVIDER_SITE_OTHER): Payer: Medicare Other | Admitting: Interventional Cardiology

## 2019-06-15 ENCOUNTER — Other Ambulatory Visit: Payer: Self-pay

## 2019-06-15 VITALS — BP 136/64 | HR 54 | Ht 65.5 in | Wt 154.8 lb

## 2019-06-15 DIAGNOSIS — I6523 Occlusion and stenosis of bilateral carotid arteries: Secondary | ICD-10-CM

## 2019-06-15 DIAGNOSIS — I10 Essential (primary) hypertension: Secondary | ICD-10-CM | POA: Diagnosis not present

## 2019-06-15 DIAGNOSIS — I5032 Chronic diastolic (congestive) heart failure: Secondary | ICD-10-CM

## 2019-06-15 DIAGNOSIS — I447 Left bundle-branch block, unspecified: Secondary | ICD-10-CM | POA: Diagnosis not present

## 2019-06-15 DIAGNOSIS — Z8673 Personal history of transient ischemic attack (TIA), and cerebral infarction without residual deficits: Secondary | ICD-10-CM

## 2019-06-15 DIAGNOSIS — R0600 Dyspnea, unspecified: Secondary | ICD-10-CM

## 2019-06-15 DIAGNOSIS — R0609 Other forms of dyspnea: Secondary | ICD-10-CM

## 2019-06-15 DIAGNOSIS — Z7189 Other specified counseling: Secondary | ICD-10-CM

## 2019-06-15 NOTE — Patient Instructions (Signed)
Medication Instructions:  Your physician recommends that you continue on your current medications as directed. Please refer to the Current Medication list given to you today.  *If you need a refill on your cardiac medications before your next appointment, please call your pharmacy*  Lab Work: None If you have labs (blood work) drawn today and your tests are completely normal, you will receive your results only by: Marland Kitchen MyChart Message (if you have MyChart) OR . A paper copy in the mail If you have any lab test that is abnormal or we need to change your treatment, we will call you to review the results.  Testing/Procedures: Your physician has requested that you have an echocardiogram. Echocardiography is a painless test that uses sound waves to create images of your heart. It provides your doctor with information about the size and shape of your heart and how well your heart's chambers and valves are working. This procedure takes approximately one hour. There are no restrictions for this procedure.   Follow-Up: At [facility], you and your health needs are our priority.  As part of our continuing mission to provide you with exceptional heart care, we have created designated Provider Care Teams.  These Care Teams include your primary Cardiologist (physician) and Advanced Practice Providers (APPs -  Physician Assistants and Nurse Practitioners) who all work together to provide you with the care you need, when you need it.  Your next appointment:   12 months  The format for your next appointment:   In Person  Provider:   You may see Sinclair Grooms, MD or one of the following Advanced Practice Providers on your designated Care Team:    Truitt Merle, NP  Cecilie Kicks, NP  Kathyrn Drown, NP   Other Instructions

## 2019-06-15 NOTE — Addendum Note (Signed)
Addended by: Loren Racer on: 06/15/2019 10:56 AM   Modules accepted: Orders

## 2019-06-25 ENCOUNTER — Other Ambulatory Visit: Payer: Self-pay | Admitting: Internal Medicine

## 2019-06-26 NOTE — Telephone Encounter (Signed)
Ativan Last filled 05-22-2019#30 Last OV 06-05-19 Next OV 06-06-20 [facility]

## 2019-06-27 ENCOUNTER — Ambulatory Visit (HOSPITAL_COMMUNITY): Payer: Medicare Other | Attending: Cardiovascular Disease

## 2019-06-27 ENCOUNTER — Other Ambulatory Visit: Payer: Self-pay

## 2019-06-27 DIAGNOSIS — I5032 Chronic diastolic (congestive) heart failure: Secondary | ICD-10-CM | POA: Insufficient documentation

## 2019-07-02 ENCOUNTER — Telehealth: Payer: Self-pay | Admitting: *Deleted

## 2019-07-02 DIAGNOSIS — I5032 Chronic diastolic (congestive) heart failure: Secondary | ICD-10-CM

## 2019-07-02 MED ORDER — FUROSEMIDE 40 MG PO TABS: 40.0000 mg | ORAL_TABLET | Freq: Every day | ORAL | 3 refills | Status: DC

## 2019-07-02 NOTE — Telephone Encounter (Signed)
Spoke with pt and went over results and recommendations.  She will come for labs on 12/1.  Pt verbalized understanding and was in agreement with plan.

## 2019-07-02 NOTE — Telephone Encounter (Signed)
-----   Message from [name], MD sent at 06/30/2019  6:18 PM EST ----- Let the patient know to increase the furosemide to 40 mg daily.Marland KitchenMarland KitchenMarland KitchenAlso inform that heart is weaker. EF now 35%. A copy will be sent to [name], MD

## 2019-07-02 NOTE — Telephone Encounter (Addendum)
Follow up   Patient calling back to update pharmacy Patient wants [facility] [place]

## 2019-07-10 ENCOUNTER — Other Ambulatory Visit: Payer: Medicare Other

## 2019-07-10 ENCOUNTER — Other Ambulatory Visit: Payer: Self-pay

## 2019-07-10 ENCOUNTER — Encounter (INDEPENDENT_AMBULATORY_CARE_PROVIDER_SITE_OTHER): Payer: Self-pay

## 2019-07-10 DIAGNOSIS — I5032 Chronic diastolic (congestive) heart failure: Secondary | ICD-10-CM

## 2019-07-10 LAB — BASIC METABOLIC PANEL
BUN/Creatinine Ratio: 25 (ref 12–28)
BUN: 26 mg/dL (ref 8–27)
CO2: 25 mmol/L (ref 20–29)
Calcium: 9.2 mg/dL (ref 8.7–10.3)
Chloride: 105 mmol/L (ref 96–106)
Creatinine, Ser: 1.05 mg/dL — ABNORMAL HIGH (ref 0.57–1.00)
GFR calc Af Amer: 56 mL/min/{1.73_m2} — ABNORMAL LOW (ref >59)
GFR calc non Af Amer: 48 mL/min/{1.73_m2} — ABNORMAL LOW (ref >59)
Glucose: 79 mg/dL (ref 65–99)
Potassium: 4.2 mmol/L (ref 3.5–5.2)
Sodium: 142 mmol/L (ref 134–144)

## 2019-07-30 ENCOUNTER — Other Ambulatory Visit: Payer: Self-pay | Admitting: Internal Medicine

## 2019-07-31 NOTE — Telephone Encounter (Signed)
Last filled 06-29-19 #30 Last OV 06-05-19 Next OV 06-06-20 [facility]

## 2019-08-21 ENCOUNTER — Telehealth: Payer: Self-pay | Admitting: Internal Medicine

## 2019-08-21 NOTE — Telephone Encounter (Signed)
Patient wants to know if Dr.[name] recommends that she get the covid vaccine.

## 2019-08-22 NOTE — Telephone Encounter (Signed)
Spoke with pt relaying Dr. Alla German message.  Pt verbalizes understanding.

## 2019-08-22 NOTE — Telephone Encounter (Signed)
Absolutely As soon as possible!!!!

## 2019-08-30 ENCOUNTER — Ambulatory Visit: Payer: Medicare Other | Attending: Internal Medicine

## 2019-08-30 DIAGNOSIS — Z23 Encounter for immunization: Secondary | ICD-10-CM

## 2019-08-30 NOTE — Progress Notes (Signed)
   Covid-19 Vaccination Clinic  Name:  Leslie [name]    MRN: [id number] DOB: 06/04/1931  08/30/2019  Leslie [name] was observed post Covid-19 immunization for 15 minutes without incidence. She was provided with Vaccine Information Sheet and instruction to access the V-Safe system.   Leslie [name] was instructed to call 911 with any severe reactions post vaccine: Marland Kitchen Difficulty breathing  . Swelling of your face and throat  . A fast heartbeat  . A bad rash all over your body  . Dizziness and weakness    Immunizations Administered    Name Date Dose VIS Date Route   Pfizer COVID-19 Vaccine 08/30/2019  9:52 AM 0.3 mL 07/20/2019 Intramuscular   Manufacturer: [place]   Lot: [id number]   [place]: [id number]

## 2019-09-06 ENCOUNTER — Other Ambulatory Visit: Payer: Self-pay | Admitting: Internal Medicine

## 2019-09-20 ENCOUNTER — Ambulatory Visit: Payer: Medicare Other | Attending: Internal Medicine

## 2019-09-20 DIAGNOSIS — Z23 Encounter for immunization: Secondary | ICD-10-CM

## 2019-09-20 NOTE — Progress Notes (Signed)
   Covid-19 Vaccination Clinic  Name:  Leslie [name]    MRN: [id number] DOB: 1930-11-06  09/20/2019  Leslie [name] was observed post Covid-19 immunization for 15 minutes without incidence. She was provided with Vaccine Information Sheet and instruction to access the V-Safe system.   Leslie [name] was instructed to call 911 with any severe reactions post vaccine: Marland Kitchen Difficulty breathing  . Swelling of your face and throat  . A fast heartbeat  . A bad rash all over your body  . Dizziness and weakness    Immunizations Administered    Name Date Dose VIS Date Route   Pfizer COVID-19 Vaccine 09/20/2019 10:03 AM 0.3 mL 07/20/2019 Intramuscular   Manufacturer: [place]   Lot: [id number]   [place]: [id number]

## 2019-10-09 ENCOUNTER — Other Ambulatory Visit: Payer: Self-pay | Admitting: Internal Medicine

## 2019-10-09 NOTE — Telephone Encounter (Signed)
Last filled 09-06-19 #30 Last OV 06-05-19 Next OV 06-06-20 [facility]

## 2019-10-15 ENCOUNTER — Other Ambulatory Visit: Payer: Self-pay | Admitting: Internal Medicine

## 2019-10-19 ENCOUNTER — Other Ambulatory Visit: Payer: Self-pay | Admitting: Internal Medicine

## 2019-11-14 ENCOUNTER — Other Ambulatory Visit: Payer: Self-pay | Admitting: Internal Medicine

## 2019-11-14 NOTE — Telephone Encounter (Signed)
Last filled 10-10-19 #30 Last OV 06-05-19 Next OV 06-06-20 [facility]

## 2019-12-14 ENCOUNTER — Other Ambulatory Visit: Payer: Self-pay | Admitting: Internal Medicine

## 2019-12-14 NOTE — Telephone Encounter (Signed)
Last filled 11-15-19 #30 Last OV 06-05-19 Next OV 06-06-20 [facility]

## 2019-12-18 ENCOUNTER — Telehealth: Payer: Self-pay

## 2019-12-18 NOTE — Telephone Encounter (Signed)
Have her hold the blood pressure medications and furosemide till her appointment tomorrow

## 2019-12-18 NOTE — Telephone Encounter (Signed)
Patient notified as instructed by telephone and verbalized understanding. Patient was advised to stay hydrated and eat well balanced meals. Patient was advised to rest today and to be careful moving around. Patient scheduled for an appointment tomorrow 12/19/19 with Dr. Silvio Pate at 9:45 am. Patient was given ER and 911  precautions and she verbalized understanding.

## 2019-12-18 NOTE — Telephone Encounter (Signed)
Patient called stating that the past 2 days she has felt life less and unsteady on her feet. Her b/p has been low the past 2 days and readings have been 99/55, 99/51, 103/64 and this morning was 130/70.  Transferred patient to speak with Emelia Salisbury further.

## 2019-12-18 NOTE — Telephone Encounter (Signed)
Spoke to patient and was advised that she started feeling real tired and unsteady on her feet about two weeks ago. Patient denies any chest pain, but has had shoulder pain and back pain. Patient stated that she had to have blood transfusions several years ago and felt real tired at that time.  Patient stated that she fell a few weeks back while pumping gas because the pavement got wet. Patient stated that she did not injury her self, just some bruises. Patient stated that her blood pressure dropped 90/54 last night. Patient stated that she did not take her blood pressure medication this morning. Patient stated that her blood pressure this morning is 130/70. Patient stated that she feels a little dizzy when she first stands up at times.

## 2019-12-19 ENCOUNTER — Other Ambulatory Visit: Payer: Self-pay

## 2019-12-19 ENCOUNTER — Ambulatory Visit (INDEPENDENT_AMBULATORY_CARE_PROVIDER_SITE_OTHER): Payer: Medicare Other | Admitting: Internal Medicine

## 2019-12-19 ENCOUNTER — Encounter: Payer: Self-pay | Admitting: Internal Medicine

## 2019-12-19 VITALS — BP 118/78 | HR 66 | Temp 96.8°F | Ht 65.5 in | Wt 149.0 lb

## 2019-12-19 DIAGNOSIS — I959 Hypotension, unspecified: Secondary | ICD-10-CM | POA: Diagnosis not present

## 2019-12-19 DIAGNOSIS — E538 Deficiency of other specified B group vitamins: Secondary | ICD-10-CM

## 2019-12-19 DIAGNOSIS — F39 Unspecified mood [affective] disorder: Secondary | ICD-10-CM

## 2019-12-19 DIAGNOSIS — I5042 Chronic combined systolic (congestive) and diastolic (congestive) heart failure: Secondary | ICD-10-CM | POA: Diagnosis not present

## 2019-12-19 LAB — CBC
HCT: 40.3 % (ref 36.0–46.0)
Hemoglobin: 13.4 g/dL (ref 12.0–15.0)
MCHC: 33.3 g/dL (ref 30.0–36.0)
MCV: 91 fl (ref 78.0–100.0)
Platelets: 226 10*3/uL (ref 150.0–400.0)
RBC: 4.43 Mil/uL (ref 3.87–5.11)
RDW: 13.7 % (ref 11.5–15.5)
WBC: 5.4 10*3/uL (ref 4.0–10.5)

## 2019-12-19 LAB — RENAL FUNCTION PANEL
Albumin: 4.2 g/dL (ref 3.5–5.2)
BUN: 28 mg/dL — ABNORMAL HIGH (ref 6–23)
CO2: 29 mEq/L (ref 19–32)
Calcium: 9.7 mg/dL (ref 8.4–10.5)
Chloride: 103 mEq/L (ref 96–112)
Creatinine, Ser: 1.17 mg/dL (ref 0.40–1.20)
GFR: 43.72 mL/min — ABNORMAL LOW (ref >60.00)
Glucose, Bld: 100 mg/dL — ABNORMAL HIGH (ref 70–99)
Phosphorus: 3.4 mg/dL (ref 2.3–4.6)
Potassium: 4.9 mEq/L (ref 3.5–5.1)
Sodium: 139 mEq/L (ref 135–145)

## 2019-12-19 LAB — T4, FREE: Free T4: 0.99 ng/dL (ref 0.60–1.60)

## 2019-12-19 LAB — VITAMIN B12: Vitamin B-12: 434 pg/mL (ref 211–911)

## 2019-12-19 MED ORDER — METOPROLOL SUCCINATE ER 25 MG PO TB24: 25.0000 mg | ORAL_TABLET | Freq: Every day | ORAL | 3 refills | Status: DC

## 2019-12-19 MED ORDER — QUINAPRIL HCL 10 MG PO TABS: 10.0000 mg | ORAL_TABLET | Freq: Every day | ORAL | 3 refills | Status: DC

## 2019-12-19 NOTE — Assessment & Plan Note (Signed)
Does have some anxiety Not sure she needs the citalopram anymore--started after husband died Will try every other day ---with the next visit

## 2019-12-19 NOTE — Assessment & Plan Note (Signed)
No clear evidence of decompensation of CHF now Possible new ischemia--but I suspect this is mostly medication related Will make adjustments

## 2019-12-19 NOTE — Patient Instructions (Signed)
Please weigh yourself every morning---only take the furosemide if your weight is 150# or more. Check your blood pressure every morning--only take the quinapril if it is over 100 on the top. This dose is now 10mg . Your dose of metoprolol is only 25mg  now--you can cut the 50mg  tabs in half until they run out. Don't add salt in your cooking.

## 2019-12-19 NOTE — Assessment & Plan Note (Signed)
Still gets monthly injections

## 2019-12-19 NOTE — Progress Notes (Signed)
Subjective:    Patient ID: Leslie [name], female    DOB: 01/08/1931, 86 y.o.   MRN: [id number]  HPI Here due to low blood pressure and fatigue With nephew---Rodney This visit occurred during the SARS-CoV-2 public health emergency.  Safety protocols were in place, including screening questions prior to the visit, additional usage of staff PPE, and extensive cleaning of exam room while observing appropriate contact time as indicated for disinfecting solutions.   "I am just feeling like nothing lately" Has lost some weight--over the past week ( Checked BP last week---99/55 Didn't feel well over the last few weeks Getting dizzy/"haze" when standing up-- ?2 weeks  Family found out about this yesterday That prompted the call  Stopped the quinapril yesterday Did continue the metoprolol  Gets "tired" through her chest Reviewed the echo from November--EF now 35% Has been taking furosemide 40mg  daily since then Has had some times that her heart seemed to be beating fast --1-2 minutes Gets DOE with housework--has to rest. No problems with showering Sleeps in bed with head up slightly---no PND  Current Outpatient Medications on File Prior to Visit  Medication Sig Dispense Refill  . BD INTEGRA SYRINGE 25G X 1" 3 ML MISC USE 1 EVERY 30 (THIRTY) DAYS. WITH VITAMINE B 12 INJECTION 3 each 3  . Calcium Carbonate-Vit D-Min (CALCIUM 1200 PO) Take 1 tablet by mouth daily.    . citalopram (CELEXA) 20 MG tablet TAKE 1 TABLET BY MOUTH EVERY DAY 90 tablet 3  . clopidogrel (PLAVIX) 75 MG tablet TAKE 1 TABLET BY MOUTH EVERY DAY 90 tablet 3  . cyanocobalamin (,VITAMIN B-12,) 1000 MCG/ML injection INJECT 1 ML (1,000 MCG TOTAL) INTO THE MUSCLE EVERY 30 (THIRTY) DAYS. 3 mL 3  . levothyroxine (SYNTHROID) 75 MCG tablet TAKE 1 TABLET BY MOUTH EVERY DAY 90 tablet 3  . LORazepam (ATIVAN) 1 MG tablet TAKE 1/2 TO 1 TABLET BY MOUTH AT BEDTIME AS NEEDED 30 tablet 0  . metoprolol succinate (TOPROL-XL) 50 MG 24 hr  tablet TAKE 1 TABLET (50 MG TOTAL) BY MOUTH EVERY MORNING. TAKE WITH OR IMMEDIATELY FOLLOWING A MEAL. 90 tablet 3  . NEEDLE, DISP, 25 G (B-D DISP NEEDLE 25GX1") 25G X 1" MISC 1 each by Does not apply route every 30 (thirty) days. 1 each 11  . quinapril (ACCUPRIL) 40 MG tablet TAKE 0.5 TABLETS BY MOUTH DAILY. 45 tablet 3  . Syringe, Disposable, 3 ML MISC 1 each by Does not apply route every 30 (thirty) days. 1 each 11  . furosemide (LASIX) 40 MG tablet Take 1 tablet (40 mg total) by mouth daily. 90 tablet 3   No current facility-administered medications on file prior to visit.    Allergies  Allergen Reactions  . Sulfa Antibiotics Other (See Comments)    "urine crystallizes"  . Dilaudid [Hydromorphone Hcl] Nausea Only       . Xopenex [Levalbuterol Hcl] Other (See Comments)    "gives me the shakes"    Past Medical History:  Diagnosis Date  . Anemia   . Anxiety   . Arthritis   . Bundle branch block left   . CHF (congestive heart failure) (HCC)    chronic combine CHF  . Complication of anesthesia    " stomach did not wake up after back surgery "   . Corneal burn    left eye-is almost healed"Cascade pod"  . Depression   . GERD (gastroesophageal reflux disease)    tums  . Headache  hx of migraines   . Heart failure, left-sided ([place])   . Hypertension   . Hypothyroidism   . MI (myocardial infarction) ([place])   . Nonischemic cardiomyopathy ([place])    '01 EF 30% with normal coronaries; EF 45-50% 03/2012  . Shortness of breath    hx of nothing current on 01/17/2015   . Stroke [facility])    blurred vision residual    Past Surgical History:  Procedure Laterality Date  . CARDIAC CATHETERIZATION     13 yrs. ago  . CATARACT EXTRACTION, BILATERAL Bilateral   . detached retinia    . DILATION AND CURETTAGE OF UTERUS    . EYE SURGERY     bilateral cataracts  . HERNIA REPAIR     hiatial  . hiatal hernia surgery     . LUMBAR LAMINECTOMY/DECOMPRESSION MICRODISCECTOMY  05/17/2012    Procedure: LUMBAR LAMINECTOMY/DECOMPRESSION MICRODISCECTOMY 2 LEVELS;  Surgeon: Ophelia Charter, MD;  Location: [employer] [facility];  Service: Neurosurgery;  Laterality: N/A;  Lumbar four laminectomy with bilateral Lumbar three laminotomies and removal of synovial cyst  . TONSILLECTOMY    . TOTAL KNEE ARTHROPLASTY Left 01/21/2015   Procedure: LEFT TOTAL KNEE ARTHROPLASTY;  Surgeon: Paralee Cancel, MD;  Location: [facility];  Service: Orthopedics;  Laterality: Left;  . TOTAL KNEE ARTHROPLASTY Right 07/22/2015   Procedure: RIGHT TOTAL KNEE ARTHROPLASTY;  Surgeon: Paralee Cancel, MD;  Location: [facility];  Service: Orthopedics;  Laterality: Right;    Family History  Problem Relation Age of Onset  . CAD Mother   . Dementia Father   . Hypertension Sister   . Diabetes Sister   . Cancer Sister     Social History   Socioeconomic History  . Marital status: Widowed    Spouse name: Not on file  . Number of children: 1  . Years of education: Not on file  . Highest education level: Not on file  Occupational History  . Occupation: Regulatory affairs officer    Comment: retired  Tobacco Use  . Smoking status: Never Smoker  . Smokeless tobacco: Never Used  Substance and Sexual Activity  . Alcohol use: No  . Drug use: No  . Sexual activity: Never  Other Topics Concern  . Not on file  Social History Narrative   Widowed 2015   1 adopted son      Has living will   Son, then nephew [name], are health care POAs   Not sure about DNR--probably would accept resuscitation for now   No machine or tube feeds if cognitively unaware   Social Determinants of Health   Financial Resource Strain:   . Difficulty of Paying Living Expenses:   Food Insecurity:   . Worried About Charity fundraiser in the Last Year:   . Arboriculturist in the Last Year:   Transportation Needs:   . Film/video editor (Medical):   Marland Kitchen Lack of Transportation (Non-Medical):   Physical Activity:   . Days of Exercise per Week:   . Minutes of  Exercise per Session:   Stress:   . Feeling of Stress :   Social Connections:   . Frequency of Communication with Friends and Family:   . Frequency of Social Gatherings with Friends and Family:   . Attends Religious Services:   . Active Member of Clubs or Organizations:   . Attends Archivist Meetings:   Marland Kitchen Marital Status:   Intimate Partner Violence:   . Fear of Current or Ex-Partner:   .  Emotionally Abused:   Marland Kitchen Physically Abused:   . Sexually Abused:    Review of Systems Does use some salt in her cooking Not sleeping well--not new. Lorazepam helps    Objective:   Physical Exam  Constitutional: No distress.  Neck: No thyromegaly present.  Cardiovascular: Regular rhythm and normal heart sounds. Exam reveals no gallop.  No murmur heard. Rate ~58  Respiratory: Effort normal and breath sounds normal. No respiratory distress. She has no wheezes. She has no rales.  Musculoskeletal:        General: No edema.  Lymphadenopathy:    She has no cervical adenopathy.  Psychiatric: She has a normal mood and affect. Her behavior is normal.           Assessment & Plan:

## 2019-12-19 NOTE — Assessment & Plan Note (Signed)
Chronic diastolic CHF--now with EF down again Will change furosemide to weight based---take if over 150# at home Relative bradycardia may be related to her dizziness/hypotension--will cut the metoprolol Restart quinapril at just 10mg 

## 2020-01-16 ENCOUNTER — Other Ambulatory Visit: Payer: Self-pay | Admitting: Internal Medicine

## 2020-01-16 NOTE — Telephone Encounter (Signed)
Last filled 12-14-19 #30 Last OV 06-05-19 Next OV 06-06-20 [facility]

## 2020-01-21 ENCOUNTER — Other Ambulatory Visit: Payer: Self-pay

## 2020-01-21 ENCOUNTER — Ambulatory Visit (INDEPENDENT_AMBULATORY_CARE_PROVIDER_SITE_OTHER): Payer: Medicare Other | Admitting: Internal Medicine

## 2020-01-21 ENCOUNTER — Encounter: Payer: Self-pay | Admitting: Internal Medicine

## 2020-01-21 DIAGNOSIS — I5042 Chronic combined systolic (congestive) and diastolic (congestive) heart failure: Secondary | ICD-10-CM | POA: Diagnosis not present

## 2020-01-21 DIAGNOSIS — D6869 Other thrombophilia: Secondary | ICD-10-CM

## 2020-01-21 DIAGNOSIS — I952 Hypotension due to drugs: Secondary | ICD-10-CM | POA: Diagnosis not present

## 2020-01-21 NOTE — Assessment & Plan Note (Signed)
She is doing well Furosemide is prn only She is concerned about exercise tolerance---but I explained that she is doing well with her current activity level

## 2020-01-21 NOTE — Assessment & Plan Note (Signed)
BP Readings from Last 3 Encounters:  01/21/20 122/76  12/19/19 118/78  06/15/19 136/64   Better now on lower metoprolol and quinapril

## 2020-01-21 NOTE — Progress Notes (Signed)
Subjective:    Patient ID: Leslie [name], female    DOB: 08/24/1930, 86 y.o.   MRN: [id number]  HPI Here for follow up of hypotension and CHF With nephew Rod again This visit occurred during the SARS-CoV-2 public health emergency.  Safety protocols were in place, including screening questions prior to the visit, additional usage of staff PPE, and extensive cleaning of exam room while observing appropriate contact time as indicated for disinfecting solutions.   Still stays tired all the time Not sleepy Has to sit down and rest after doing things (1 hour of housework or yard work)  Did have brief spells of lightheadedness for 3 days at the end of May  No chest pain Does have a pain in right flank--notes it when she lies down and turns in bed No known injury  Using furosemide rarely now---only if over 150# (once since last appt) Checking BP daily---- 108/63--147/59  Current Outpatient Medications on File Prior to Visit  Medication Sig Dispense Refill   BD INTEGRA SYRINGE 25G X 1" 3 ML MISC USE 1 EVERY 30 (THIRTY) DAYS. WITH VITAMINE B 12 INJECTION 3 each 3   Calcium Carbonate-Vit D-Min (CALCIUM 1200 PO) Take 1 tablet by mouth daily.     citalopram (CELEXA) 20 MG tablet TAKE 1 TABLET BY MOUTH EVERY DAY 90 tablet 3   clopidogrel (PLAVIX) 75 MG tablet TAKE 1 TABLET BY MOUTH EVERY DAY 90 tablet 3   cyanocobalamin (,VITAMIN B-12,) 1000 MCG/ML injection INJECT 1 ML (1,000 MCG TOTAL) INTO THE MUSCLE EVERY 30 (THIRTY) DAYS. 3 mL 3   levothyroxine (SYNTHROID) 75 MCG tablet TAKE 1 TABLET BY MOUTH EVERY DAY 90 tablet 3   LORazepam (ATIVAN) 1 MG tablet TAKE 1/2 TO 1 TABLET BY MOUTH AT BEDTIME AS NEEDED 30 tablet 0   metoprolol succinate (TOPROL-XL) 25 MG 24 hr tablet Take 1 tablet (25 mg total) by mouth daily. 90 tablet 3   NEEDLE, DISP, 25 G (B-D DISP NEEDLE 25GX1") 25G X 1" MISC 1 each by Does not apply route every 30 (thirty) days. 1 each 11   quinapril (ACCUPRIL) 10 MG tablet Take 1  tablet (10 mg total) by mouth daily. 90 tablet 3   furosemide (LASIX) 40 MG tablet Take 1 tablet (40 mg total) by mouth daily. 90 tablet 3   No current facility-administered medications on file prior to visit.    Allergies  Allergen Reactions   Sulfa Antibiotics Other (See Comments)    "urine crystallizes"   Dilaudid [Hydromorphone Hcl] Nausea Only        Xopenex [Levalbuterol Hcl] Other (See Comments)    "gives me the shakes"    Past Medical History:  Diagnosis Date   Anemia    Anxiety    Arthritis    Bundle branch block left    CHF (congestive heart failure) (HCC)    chronic combine CHF   Complication of anesthesia    " stomach did not wake up after back surgery "    Corneal burn    left eye-is almost healed"[place]ascade pod"   Depression    GERD (gastroesophageal reflux disease)    tums   Headache    hx of migraines    Heart failure, left-sided (HCC)    Hypertension    Hypothyroidism    MI (myocardial infarction) ([place])    Nonischemic cardiomyopathy ([place])    '01 EF 30% with normal coronaries; EF 45-50% 03/2012   Shortness of breath    hx  of nothing current on 01/17/2015    Stroke [facility])    blurred vision residual    Past Surgical History:  Procedure Laterality Date   CARDIAC CATHETERIZATION     13 yrs. ago   CATARACT EXTRACTION, BILATERAL Bilateral    detached retinia     DILATION AND CURETTAGE OF UTERUS     EYE SURGERY     bilateral cataracts   HERNIA REPAIR     hiatial   hiatal hernia surgery      LUMBAR LAMINECTOMY/DECOMPRESSION MICRODISCECTOMY  05/17/2012   Procedure: LUMBAR LAMINECTOMY/DECOMPRESSION MICRODISCECTOMY 2 LEVELS;  Surgeon: Ophelia Charter, MD;  Location: [place] [facility];  Service: Neurosurgery;  Laterality: N/A;  Lumbar four laminectomy with bilateral Lumbar three laminotomies and removal of synovial cyst   TONSILLECTOMY     TOTAL KNEE ARTHROPLASTY Left 01/21/2015   Procedure: LEFT TOTAL KNEE ARTHROPLASTY;  Surgeon:  Paralee Cancel, MD;  Location: [facility];  Service: Orthopedics;  Laterality: Left;   TOTAL KNEE ARTHROPLASTY Right 07/22/2015   Procedure: RIGHT TOTAL KNEE ARTHROPLASTY;  Surgeon: Paralee Cancel, MD;  Location: [facility];  Service: Orthopedics;  Laterality: Right;    Family History  Problem Relation Age of Onset   CAD Mother    Dementia Father    Hypertension Sister    Diabetes Sister    Cancer Sister     Social History   Socioeconomic History   Marital status: Widowed    Spouse name: Not on file   Number of children: 1   Years of education: Not on file   Highest education level: Not on file  Occupational History   Occupation: Regulatory affairs officer    Comment: retired  Tobacco Use   Smoking status: Never Smoker   Smokeless tobacco: Never Used  Scientific laboratory technician Use: Never used  Substance and Sexual Activity   Alcohol use: No   Drug use: No   Sexual activity: Never  Other Topics Concern   Not on file  Social History Narrative   Widowed 2015   1 adopted son      Has living will   Son, then nephew [name], are health care POAs   Not sure about DNR--probably would accept resuscitation for now   No machine or tube feeds if cognitively unaware   Social Determinants of Health   Financial Resource Strain:    Difficulty of Paying Living Expenses:   Food Insecurity:    Worried About Charity fundraiser in the Last Year:    Arboriculturist in the Last Year:   Transportation Needs:    Film/video editor (Medical):    Lack of Transportation (Non-Medical):   Physical Activity:    Days of Exercise per Week:    Minutes of Exercise per Session:   Stress:    Feeling of Stress :   Social Connections:    Frequency of Communication with Friends and Family:    Frequency of Social Gatherings with Friends and Family:    Attends Religious Services:    Active Member of Clubs or Organizations:    Attends Music therapist:    Marital  Status:   Intimate Partner Violence:    Fear of Current or Ex-Partner:    Emotionally Abused:    Physically Abused:    Sexually Abused:    Review of Systems  Appetite is down with the heat Weight is up 2# since last visit Not sleeping well---often takes a while to get to  sleep (uses the lorazepam nightly) Bruises and skin tears easily     Objective:   Physical Exam  Constitutional: No distress.  Cardiovascular: Normal rate and regular rhythm. Exam reveals no gallop.  No murmur heard. Respiratory: Effort normal and breath sounds normal. She has no wheezes. She has no rales.  Musculoskeletal:     Right lower leg: No edema.     Left lower leg: No edema.  Lymphadenopathy:    She has no cervical adenopathy.  Neurological: She is alert.  Skin:  Scattered ecchymoses  Psychiatric: Her behavior is normal. Mood normal.           Assessment & Plan:

## 2020-01-21 NOTE — Assessment & Plan Note (Signed)
Easy bruising on the clopidogrel

## 2020-02-18 ENCOUNTER — Other Ambulatory Visit: Payer: Self-pay | Admitting: Internal Medicine

## 2020-02-18 NOTE — Telephone Encounter (Signed)
Last filled 01-16-20 #30 Last OV 01-21-20 Next OV 06-06-20 [facility]

## 2020-03-12 IMAGING — CT CT ANGIO NECK
3 of 13 series · 15 of 46 positions shown, 17 images · IV contrast (iopamidol)
Comparison: None.

CLINICAL DATA: Abnormal carotid Doppler ultrasound.

EXAM:
CT ANGIOGRAPHY NECK
TECHNIQUE: Multidetector CT imaging of the neck was performed using the
standard protocol during bolus administration of intravenous
contrast. Multiplanar CT image reconstructions and MIPs were
obtained to evaluate the vascular anatomy. Carotid stenosis
measurements (when applicable) are obtained utilizing NASCET
criteria, using the distal internal carotid diameter as the
denominator.
CONTRAST:  80mL 6SEOYV-FM4 IOPAMIDOL (6SEOYV-FM4) INJECTION 76%

[Series 8: thin axial · axial · 0.46mm/px · z∈[-210,-166]mm · 3 of 199 slices shown]
[im 23/199  soft-tissue]
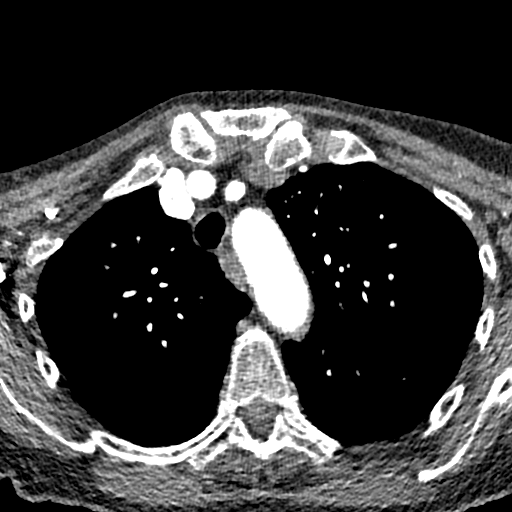
[im 45/199  soft-tissue]
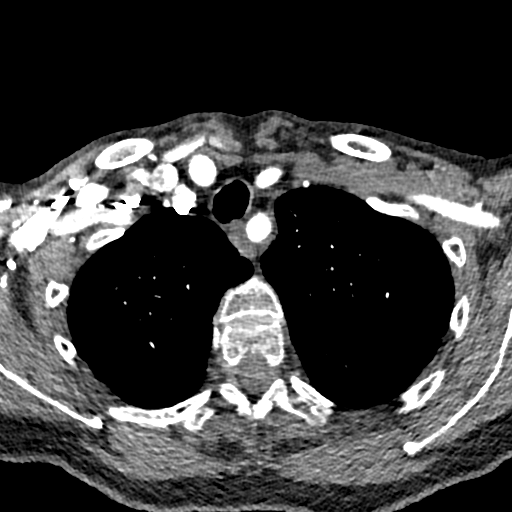
[im 67/199  soft-tissue]
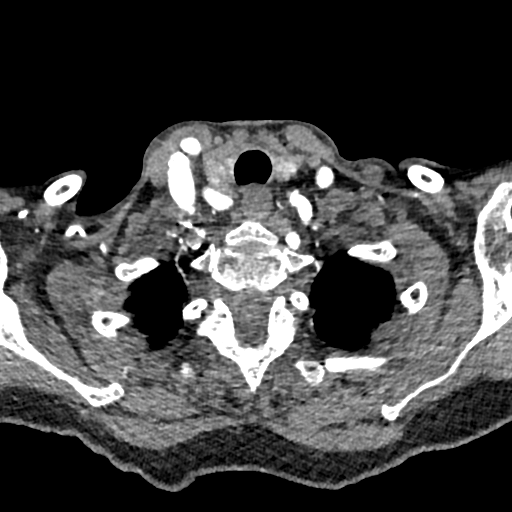

[Series 10: coronal thin · coronal · 0.44mm/px · 3 of 250 slices shown]
[im 72/250  soft-tissue]
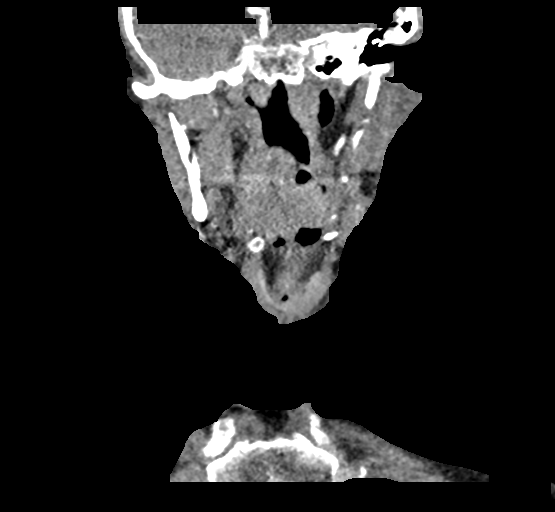
[im 107/250  soft-tissue]
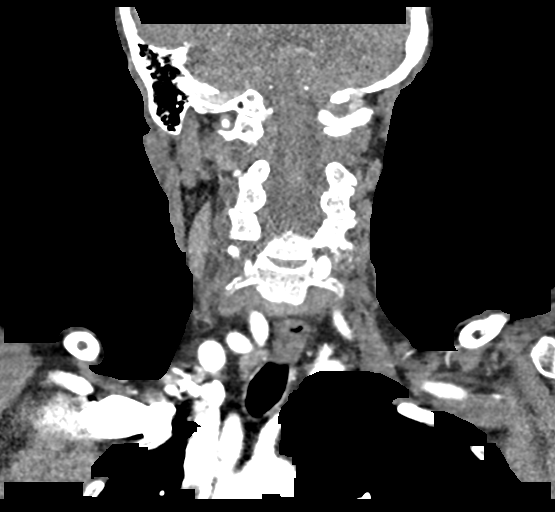
[im 143/250  soft-tissue]
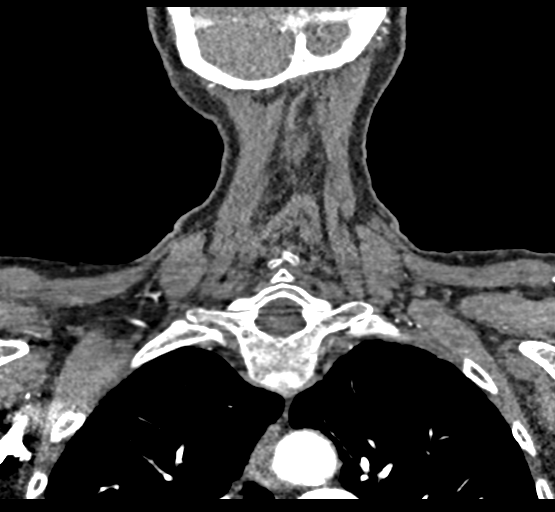

[Series 15: carotid mpr · axial · 0.49mm/px · z∈[-248,-77]mm · 9 of 212 slices shown, 11 images]
[im 22/212  soft-tissue]
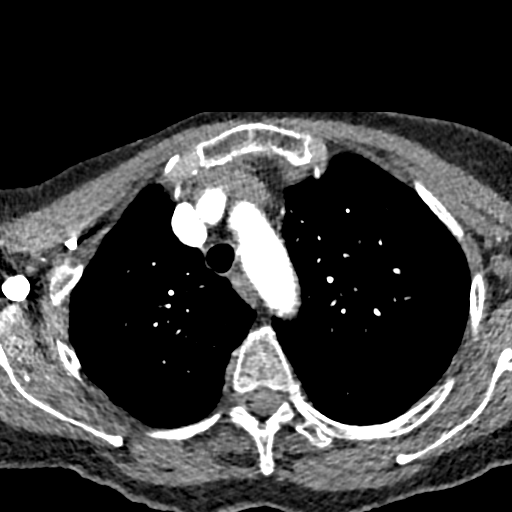
[im 22/212  bone]
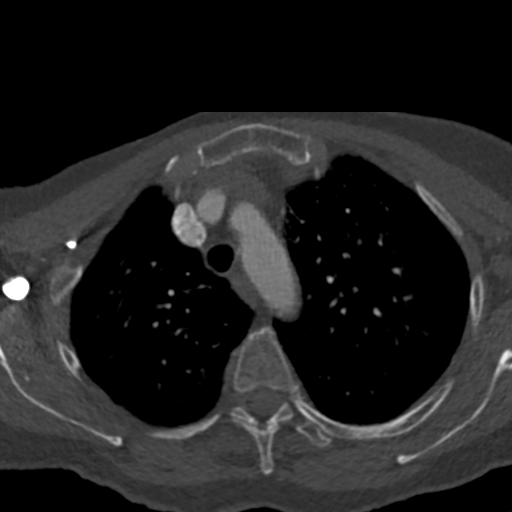
[im 43/212  soft-tissue]
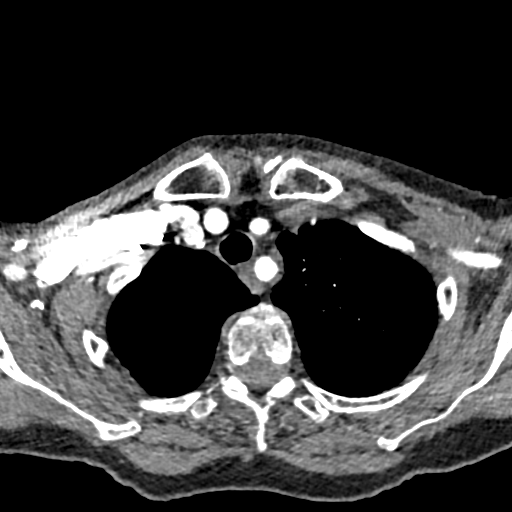
[im 64/212  soft-tissue]
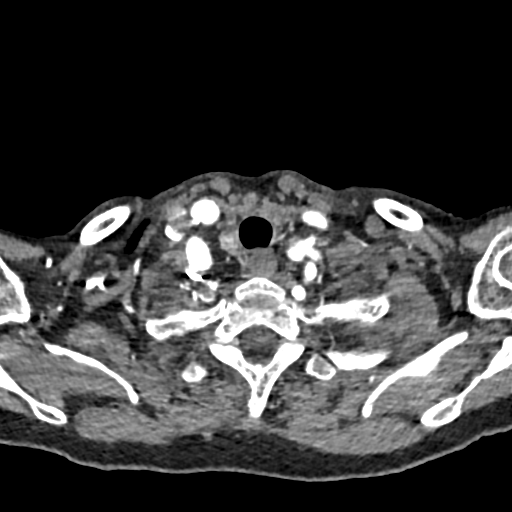
[im 85/212  soft-tissue]
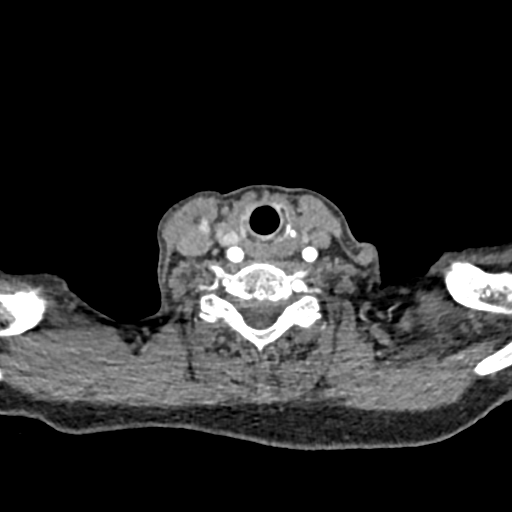
[im 106/212  soft-tissue]
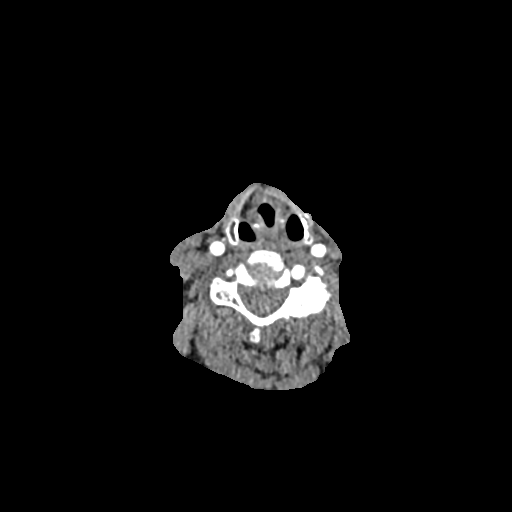
[im 127/212  soft-tissue]
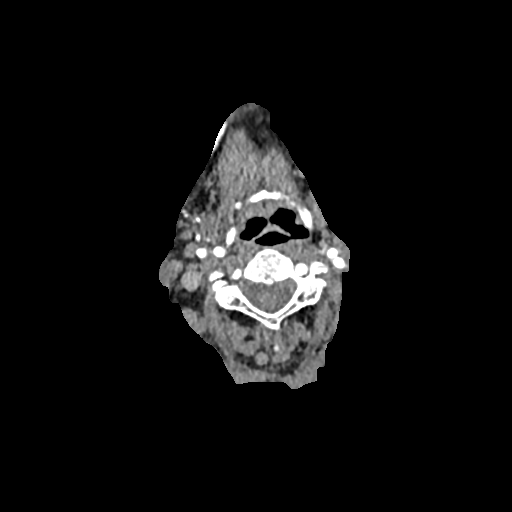
[im 148/212  soft-tissue]
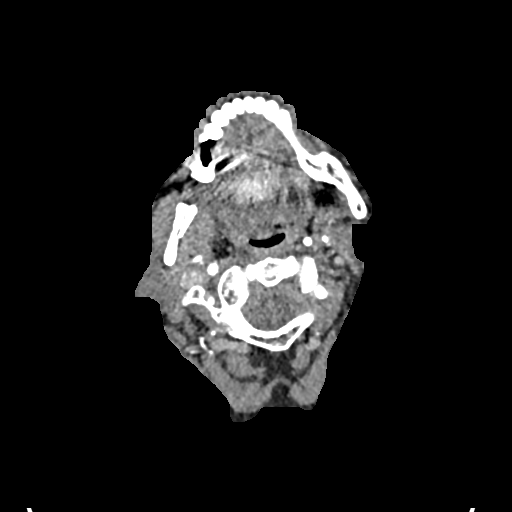
[im 169/212  soft-tissue]
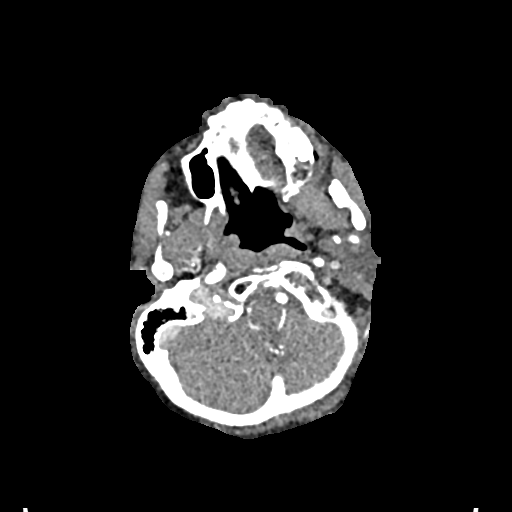
[im 190/212  soft-tissue]
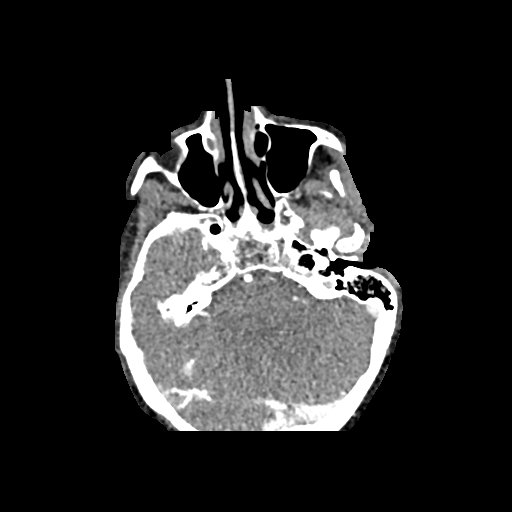
[im 190/212  bone]
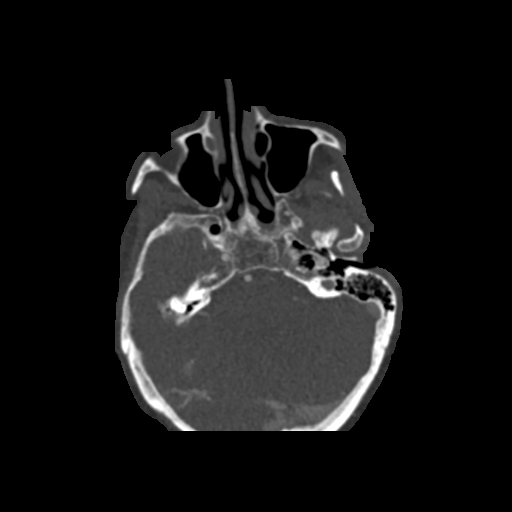

[15 of 46 positions shown; findings below may reference images not displayed]

FINDINGS: Aortic arch: A 3 vessel arch configuration is present.
Atherosclerotic changes are present at the origin of the left
subclavian artery. There is no focal stenosis or aneurysm.

Right carotid system: The right common carotid artery is tortuous
without focal stenosis. Atherosclerotic changes are present at the
carotid bifurcation without a significant stenosis relative to the
more distal vessel. There is moderate tortuosity of the cervical
right ICA. There is also an irregular beaded appearance to the right
internal carotid artery consistent with fibromuscular dysplasia. No
focal stenosis is present. Atherosclerotic changes are noted within
the cavernous internal carotid artery without significant stenosis
through the ICA terminus.

Left carotid system: The left common carotid artery is tortuous
without significant stenosis. Atherosclerotic changes are present at
the left carotid bifurcation without a significant stenosis relative
to the more distal vessel. The cervical left ICA is tortuous and
demonstrates a similar pattern of irregularity as on the right. No
focal stenosis is evident. The left internal carotid artery is
normal at the skull base with some calcifications in the cavernous
segment but no significant stenosis through the ICA terminus.

Vertebral arteries: The vertebral arteries originate from the
subclavian arteries bilaterally without significant stenosis. The
left vertebral artery is the dominant vessel. There is no
significant stenosis of either vessel in the neck. The PICA origins
are visualized and normal bilaterally. The right vertebral artery
centrally terminates at the PICA sending only a very small branch to
the basilar. The basilar artery is unremarkable.

Skeleton: Mild degenerative changes of the cervical spine are most
pronounced at C6-7 and to lesser extent at C5-6.

Other neck: No focal mucosal or submucosal lesions are present.
Salivary glands are within normal limits. Thyroid is unremarkable.

Upper chest: The lung apices are clear. No focal nodule, mass, or
airspace disease present. The thoracic inlet is within normal
limits.
IMPRESSION: 1. Atherosclerotic calcifications at the carotid bifurcations
bilaterally, right greater than left. There is no significant
stenosis to the more distal vessels.
2. Beaded vessel irregularity in the cervical internal carotid
arteries bilaterally extending over 3 cm on the right and 2 cm on
the left is most consistent with fibromuscular dysplasia.
3. No associated stenosis.
4. Mild atherosclerotic changes involving the cavernous internal
carotid arteries bilaterally and the aortic arch.

## 2020-03-19 ENCOUNTER — Other Ambulatory Visit: Payer: Self-pay | Admitting: Internal Medicine

## 2020-03-19 NOTE — Telephone Encounter (Signed)
Last filled 02-18-20 #30 Last OV 01-21-20 Next OV 06-06-20 [facility]

## 2020-04-21 ENCOUNTER — Other Ambulatory Visit: Payer: Self-pay | Admitting: Internal Medicine

## 2020-04-21 NOTE — Telephone Encounter (Signed)
Last filled 03-25-20 #30 Last OV 01-21-20 Next OV 06-06-20 [facility]

## 2020-05-15 DIAGNOSIS — S93602A Unspecified sprain of left foot, initial encounter: Secondary | ICD-10-CM | POA: Diagnosis not present

## 2020-05-15 DIAGNOSIS — L03116 Cellulitis of left lower limb: Secondary | ICD-10-CM | POA: Diagnosis not present

## 2020-05-19 DIAGNOSIS — S93602D Unspecified sprain of left foot, subsequent encounter: Secondary | ICD-10-CM | POA: Diagnosis not present

## 2020-05-21 ENCOUNTER — Other Ambulatory Visit: Payer: Self-pay | Admitting: Internal Medicine

## 2020-05-21 NOTE — Telephone Encounter (Signed)
Last filled 04-21-20 #30 Last OV 01-21-20 Next OV 06-06-20 [facility]

## 2020-06-06 ENCOUNTER — Ambulatory Visit (INDEPENDENT_AMBULATORY_CARE_PROVIDER_SITE_OTHER): Payer: Medicare Other | Admitting: Internal Medicine

## 2020-06-06 ENCOUNTER — Encounter: Payer: Self-pay | Admitting: Internal Medicine

## 2020-06-06 VITALS — BP 128/70 | HR 55 | Temp 97.9°F | Ht 66.0 in | Wt 150.0 lb

## 2020-06-06 DIAGNOSIS — Z0000 Encounter for general adult medical examination without abnormal findings: Secondary | ICD-10-CM | POA: Diagnosis not present

## 2020-06-06 DIAGNOSIS — I10 Essential (primary) hypertension: Secondary | ICD-10-CM | POA: Diagnosis not present

## 2020-06-06 DIAGNOSIS — N1832 Chronic kidney disease, stage 3b: Secondary | ICD-10-CM

## 2020-06-06 DIAGNOSIS — E538 Deficiency of other specified B group vitamins: Secondary | ICD-10-CM | POA: Diagnosis not present

## 2020-06-06 DIAGNOSIS — I5042 Chronic combined systolic (congestive) and diastolic (congestive) heart failure: Secondary | ICD-10-CM

## 2020-06-06 DIAGNOSIS — F39 Unspecified mood [affective] disorder: Secondary | ICD-10-CM | POA: Diagnosis not present

## 2020-06-06 DIAGNOSIS — Z23 Encounter for immunization: Secondary | ICD-10-CM | POA: Diagnosis not present

## 2020-06-06 DIAGNOSIS — I6523 Occlusion and stenosis of bilateral carotid arteries: Secondary | ICD-10-CM

## 2020-06-06 DIAGNOSIS — E039 Hypothyroidism, unspecified: Secondary | ICD-10-CM | POA: Diagnosis not present

## 2020-06-06 LAB — HEPATIC FUNCTION PANEL
ALT: 7 U/L (ref 0–35)
AST: 13 U/L (ref 0–37)
Albumin: 4.1 g/dL (ref 3.5–5.2)
Alkaline Phosphatase: 52 U/L (ref 39–117)
Bilirubin, Direct: 0.1 mg/dL (ref 0.0–0.3)
Total Bilirubin: 0.7 mg/dL (ref 0.2–1.2)
Total Protein: 6.5 g/dL (ref 6.0–8.3)

## 2020-06-06 LAB — CBC
HCT: 41.8 % (ref 36.0–46.0)
Hemoglobin: 13.7 g/dL (ref 12.0–15.0)
MCHC: 32.7 g/dL (ref 30.0–36.0)
MCV: 89.7 fl (ref 78.0–100.0)
Platelets: 195 10*3/uL (ref 150.0–400.0)
RBC: 4.67 Mil/uL (ref 3.87–5.11)
RDW: 14.7 % (ref 11.5–15.5)
WBC: 5.3 10*3/uL (ref 4.0–10.5)

## 2020-06-06 LAB — RENAL FUNCTION PANEL
Albumin: 4.1 g/dL (ref 3.5–5.2)
BUN: 15 mg/dL (ref 6–23)
CO2: 29 mEq/L (ref 19–32)
Calcium: 9.1 mg/dL (ref 8.4–10.5)
Chloride: 106 mEq/L (ref 96–112)
Creatinine, Ser: 0.93 mg/dL (ref 0.40–1.20)
GFR: 55.16 mL/min — ABNORMAL LOW (ref >60.00)
Glucose, Bld: 83 mg/dL (ref 70–99)
Phosphorus: 3.9 mg/dL (ref 2.3–4.6)
Potassium: 5 mEq/L (ref 3.5–5.1)
Sodium: 142 mEq/L (ref 135–145)

## 2020-06-06 LAB — T4, FREE: Free T4: 0.87 ng/dL (ref 0.60–1.60)

## 2020-06-06 LAB — TSH: TSH: 6.28 u[IU]/mL — ABNORMAL HIGH (ref 0.35–4.50)

## 2020-06-06 LAB — VITAMIN D 25 HYDROXY (VIT D DEFICIENCY, FRACTURES): VITD: 17.49 ng/mL — ABNORMAL LOW (ref 30.00–100.00)

## 2020-06-06 NOTE — Assessment & Plan Note (Signed)
Chronic dysthymia mostly On citalopram and lorazepam for sleep

## 2020-06-06 NOTE — Assessment & Plan Note (Signed)
I have personally reviewed the Medicare Annual Wellness questionnaire and have noted 1. The patient's medical and social history 2. Their use of alcohol, tobacco or illicit drugs 3. Their current medications and supplements 4. The patient's functional ability including ADL's, fall risks, home safety risks and hearing or visual             impairment. 5. Diet and physical activities 6. Evidence for depression or mood disorders  The patients weight, height, BMI and visual acuity have been recorded in the chart I have made referrals, counseling and provided education to the patient based review of the above and I have provided the pt with a written personalized care plan for preventive services.  I have provided you with a copy of your personalized plan for preventive services. Please take the time to review along with your updated medication list.  Due for COVID booster soon Flu vaccine today No cancer screening due to age

## 2020-06-06 NOTE — Assessment & Plan Note (Signed)
Still getting injections.

## 2020-06-06 NOTE — Assessment & Plan Note (Signed)
Seems to be compensated She does get tired--but no clear worsening  Continues on quinapril, metoprolol and plavix

## 2020-06-06 NOTE — Assessment & Plan Note (Signed)
BP Readings from Last 3 Encounters:  06/06/20 128/70  01/21/20 122/76  12/19/19 118/78   Quinapril and metoprolol decreased due to hypotension BP fine on current doses

## 2020-06-06 NOTE — Progress Notes (Signed)
Hearing Screening   125Hz  250Hz  500Hz  1000Hz  2000Hz  3000Hz  4000Hz  6000Hz  8000Hz   Right ear:           Left ear:           Comments: Inadequate Exam last year.   Visual Acuity Screening   Right eye Left eye Both eyes  Without correction: 20/30 20/30 20/30   With correction:

## 2020-06-06 NOTE — Assessment & Plan Note (Signed)
Will recheck On quinapril

## 2020-06-06 NOTE — Assessment & Plan Note (Signed)
Will recheck labs on levothyroxine 

## 2020-06-06 NOTE — Assessment & Plan Note (Signed)
Mild to moderate on 2019 CT angiogram Is on clopidogrel

## 2020-06-06 NOTE — Progress Notes (Signed)
Subjective:    Patient ID: Leslie [name], female    DOB: 08/13/1930, 86 y.o.   MRN: [id number]  HPI Here for Medicare wellness visit and follow up of chronic health conditions This visit occurred during the SARS-CoV-2 public health emergency.  Safety protocols were in place, including screening questions prior to the visit, additional usage of staff PPE, and extensive cleaning of exam room while observing appropriate contact time as indicated for disinfecting solutions.   Reviewed advanced directives Reviewed other doctors---- Dr Raechel Chute, Dr [place]--cardiology, Dr Jeralene Huff No hospitalizations or surgery this year Stays active but no set exercise No alcohol or tobacco Vision is problematic Poor hearing--but hasn't gotten around to getting aides Has fallen with injury Chronic mood issues Independent with instrumental ADLs Some recall issues---like certain words  She feels overall better since we cut back medication doses No chest pain---just has "tiredness in shoulders" Some cough and some throat congestion Gets DOE and tired with activity---?slight progression recently No dizziness or syncope Very slight edema No palpitations  Has had some urge incontinence---doesn't know she has to go, but then voids Mostly first thing in the morning Rarely has nocturia No dysuria or hematuria  Fell 6 weeks ago--down garage steps  Related to vision problems (prism glasses) Sprained left ankle but this is better  Mood mostly okay Gets out of house ---flowers, goats, dog Does go shopping rarely for food, etc Only occasional depressed feelings Mild anxiety Uses the lorazepam for sleep  Known CKD Last GFR was 43  Known mild carotid artery disease CT angio in 2019  Current Outpatient Medications on File Prior to Visit  Medication Sig Dispense Refill  . BD INTEGRA SYRINGE 25G X 1" 3 ML MISC USE 1 EVERY 30 (THIRTY) DAYS. WITH VITAMINE B 12 INJECTION 3 each 3  . Calcium  Carbonate-Vit D-Min (CALCIUM 1200 PO) Take 1 tablet by mouth daily.    . citalopram (CELEXA) 20 MG tablet TAKE 1 TABLET BY MOUTH EVERY DAY 90 tablet 3  . clopidogrel (PLAVIX) 75 MG tablet TAKE 1 TABLET BY MOUTH EVERY DAY 90 tablet 3  . cyanocobalamin (,VITAMIN B-12,) 1000 MCG/ML injection INJECT 1 ML (1,000 MCG TOTAL) INTO THE MUSCLE EVERY 30 (THIRTY) DAYS. 3 mL 3  . levothyroxine (SYNTHROID) 75 MCG tablet TAKE 1 TABLET BY MOUTH EVERY DAY 90 tablet 3  . LORazepam (ATIVAN) 1 MG tablet TAKE 1/2 TO 1 TABLET BY MOUTH AT BEDTIME AS NEEDED 30 tablet 0  . metoprolol succinate (TOPROL-XL) 25 MG 24 hr tablet Take 1 tablet (25 mg total) by mouth daily. 90 tablet 3  . NEEDLE, DISP, 25 G (B-D DISP NEEDLE 25GX1") 25G X 1" MISC 1 each by Does not apply route every 30 (thirty) days. 1 each 11  . quinapril (ACCUPRIL) 10 MG tablet Take 1 tablet (10 mg total) by mouth daily. 90 tablet 3   No current facility-administered medications on file prior to visit.    Allergies  Allergen Reactions  . Sulfa Antibiotics Other (See Comments)    "urine crystallizes"  . Dilaudid [Hydromorphone Hcl] Nausea Only       . Xopenex [Levalbuterol Hcl] Other (See Comments)    "gives me the shakes"    Past Medical History:  Diagnosis Date  . Anemia   . Anxiety   . Arthritis   . Bundle branch block left   . CHF (congestive heart failure) (HCC)    chronic combine CHF  . Complication of anesthesia    " stomach  did not wake up after back surgery "   . Corneal burn    left eye-is almost healed"Cascade pod"  . Depression   . GERD (gastroesophageal reflux disease)    tums  . Headache    hx of migraines   . Heart failure, left-sided ([place])   . Hypertension   . Hypothyroidism   . MI (myocardial infarction) ([place])   . Nonischemic cardiomyopathy ([place])    '01 EF 30% with normal coronaries; EF 45-50% 03/2012  . Shortness of breath    hx of nothing current on 01/17/2015   . Stroke [facility])    blurred vision residual    Past  Surgical History:  Procedure Laterality Date  . CARDIAC CATHETERIZATION     13 yrs. ago  . CATARACT EXTRACTION, BILATERAL Bilateral   . detached retinia    . DILATION AND CURETTAGE OF UTERUS    . EYE SURGERY     bilateral cataracts  . HERNIA REPAIR     hiatial  . hiatal hernia surgery     . LUMBAR LAMINECTOMY/DECOMPRESSION MICRODISCECTOMY  05/17/2012   Procedure: LUMBAR LAMINECTOMY/DECOMPRESSION MICRODISCECTOMY 2 LEVELS;  Surgeon: Ophelia Charter, MD;  Location: [facility];  Service: Neurosurgery;  Laterality: N/A;  Lumbar four laminectomy with bilateral Lumbar three laminotomies and removal of synovial cyst  . TONSILLECTOMY    . TOTAL KNEE ARTHROPLASTY Left 01/21/2015   Procedure: LEFT TOTAL KNEE ARTHROPLASTY;  Surgeon: Paralee Cancel, MD;  Location: [facility];  Service: Orthopedics;  Laterality: Left;  . TOTAL KNEE ARTHROPLASTY Right 07/22/2015   Procedure: RIGHT TOTAL KNEE ARTHROPLASTY;  Surgeon: Paralee Cancel, MD;  Location: [facility];  Service: Orthopedics;  Laterality: Right;    Family History  Problem Relation Age of Onset  . CAD Mother   . Dementia Father   . Hypertension Sister   . Diabetes Sister   . Cancer Sister     Social History   Socioeconomic History  . Marital status: Widowed    Spouse name: Not on file  . Number of children: 1  . Years of education: Not on file  . Highest education level: Not on file  Occupational History  . Occupation: Regulatory affairs officer    Comment: retired  Tobacco Use  . Smoking status: Never Smoker  . Smokeless tobacco: Never Used  Vaping Use  . Vaping Use: Never used  Substance and Sexual Activity  . Alcohol use: No  . Drug use: No  . Sexual activity: Never  Other Topics Concern  . Not on file  Social History Narrative   Widowed 2015   1 adopted son      Has living will   Son, then nephew [name], are health care POAs   Not sure about DNR--probably would accept resuscitation for now   No machine or tube feeds if cognitively  unaware   Social Determinants of Health   Financial Resource Strain:   . Difficulty of Paying Living Expenses: Not on file  Food Insecurity:   . Worried About Charity fundraiser in the Last Year: Not on file  . Ran Out of Food in the Last Year: Not on file  Transportation Needs:   . Lack of Transportation (Medical): Not on file  . Lack of Transportation (Non-Medical): Not on file  Physical Activity:   . Days of Exercise per Week: Not on file  . Minutes of Exercise per Session: Not on file  Stress:   . Feeling of Stress : Not on file  Social Connections:   . Frequency of Communication with Friends and Family: Not on file  . Frequency of Social Gatherings with Friends and Family: Not on file  . Attends Religious Services: Not on file  . Active Member of Clubs or Organizations: Not on file  . Attends Archivist Meetings: Not on file  . Marital Status: Not on file  Intimate Partner Violence:   . Fear of Current or Ex-Partner: Not on file  . Emotionally Abused: Not on file  . Physically Abused: Not on file  . Sexually Abused: Not on file   Review of Systems No heartburn. Rare regurgitation (past hiatal hernia) Appetite is good Weight is stable Wears seat belt Teeth are okay---sees dentist No skin problems Bowels are fine---no  Blood Some back pain--when tired. CMC bilaterally ache---uses OTC rub which helps Still bruises easy and it takes a long time to heal    Objective:   Physical Exam Constitutional:      Appearance: Normal appearance.  HENT:     Mouth/Throat:     Comments: No lesions Eyes:     Conjunctiva/sclera: Conjunctivae normal.     Pupils: Pupils are equal, round, and reactive to light.  Cardiovascular:     Rate and Rhythm: Normal rate and regular rhythm.     Pulses: Normal pulses.     Heart sounds: No murmur heard.  No gallop.   Pulmonary:     Effort: Pulmonary effort is normal.     Breath sounds: Normal breath sounds. No wheezing or rales.    Abdominal:     Palpations: Abdomen is soft.     Tenderness: There is no abdominal tenderness.  Musculoskeletal:     Cervical back: Neck supple.     Right lower leg: No edema.     Left lower leg: No edema.     Comments: Purplish discoloration in feet  Lymphadenopathy:     Cervical: No cervical adenopathy.  Skin:    General: Skin is warm.     Findings: No rash.  Neurological:     Mental Status: She is alert and oriented to person, place, and time.     Comments: President---"Biden, Obama----Trump" [id number] D-l-[id number] Recall 2/3---and got "glass" from last year  Psychiatric:        Mood and Affect: Mood normal.        Behavior: Behavior normal.            Assessment & Plan:

## 2020-06-09 LAB — PARATHYROID HORMONE, INTACT (NO CA): PTH: 82 pg/mL — ABNORMAL HIGH (ref 14–64)

## 2020-06-24 ENCOUNTER — Other Ambulatory Visit: Payer: Self-pay | Admitting: Internal Medicine

## 2020-06-24 NOTE — Telephone Encounter (Signed)
Last filled 05-22-20 #30 Last OV 06-06-20 Next OV 06-10-21 [facility]

## 2020-07-02 ENCOUNTER — Telehealth: Payer: Self-pay | Admitting: *Deleted

## 2020-07-02 DIAGNOSIS — W540XXA Bitten by dog, initial encounter: Secondary | ICD-10-CM | POA: Diagnosis not present

## 2020-07-02 DIAGNOSIS — Z23 Encounter for immunization: Secondary | ICD-10-CM | POA: Diagnosis not present

## 2020-07-02 DIAGNOSIS — S81852A Open bite, left lower leg, initial encounter: Secondary | ICD-10-CM | POA: Diagnosis not present

## 2020-07-02 NOTE — Telephone Encounter (Signed)
I spoke to pt and she expressed understanding and is going to go to [facility], may go to [place] in [place]

## 2020-07-02 NOTE — Telephone Encounter (Signed)
Patient called stating that she was bitten by a neighbors dog yesterday. Patient stated that she was told that the dog was up to date on his shots. Patient stated that her leg bleed a lot. Patient stated that she has a neighbor that is a nurse that cleared and wrapped her leg for her yesterday. Patient stated that her neighbor re-dressed it today and put some neosporin on it. Patient stated that her leg does not appear to be infected at this time and denies any redness or swelling. Patient stated that her neighbor is a Marine scientist and is afraid that it may get infected and told her to call her doctor and see about getting an antibiotic called in to prevent an infection. Advised patient that our providers usually will not call in an antibiotic without seeing the patient Our records show that patient had her last tetanus.08/10/2007.

## 2020-07-02 NOTE — Telephone Encounter (Signed)
She needs to be seen for tetanus and antibiotic prophylaxis

## 2020-07-09 ENCOUNTER — Other Ambulatory Visit: Payer: Self-pay

## 2020-07-09 ENCOUNTER — Ambulatory Visit (INDEPENDENT_AMBULATORY_CARE_PROVIDER_SITE_OTHER): Payer: Medicare Other | Admitting: Internal Medicine

## 2020-07-09 ENCOUNTER — Encounter: Payer: Self-pay | Admitting: Internal Medicine

## 2020-07-09 DIAGNOSIS — I6523 Occlusion and stenosis of bilateral carotid arteries: Secondary | ICD-10-CM

## 2020-07-09 DIAGNOSIS — L97909 Non-pressure chronic ulcer of unspecified part of unspecified lower leg with unspecified severity: Secondary | ICD-10-CM | POA: Insufficient documentation

## 2020-07-09 DIAGNOSIS — L97921 Non-pressure chronic ulcer of unspecified part of left lower leg limited to breakdown of skin: Secondary | ICD-10-CM

## 2020-07-09 NOTE — Assessment & Plan Note (Signed)
Abraded by dog's teeth No puncture Clean now--no need for further antibiotic Will continue the antibiotic daily

## 2020-07-09 NOTE — Progress Notes (Signed)
Subjective:    Patient ID: Leslie [name], female    DOB: March 22, 1931, 86 y.o.   MRN: [id number]  HPI Here for follow up after a dog bite--with son This visit occurred during the SARS-CoV-2 public health emergency.  Safety protocols were in place, including screening questions prior to the visit, additional usage of staff PPE, and extensive cleaning of exam room while observing appropriate contact time as indicated for disinfecting solutions.   Was at son's house and car wouldn't start Went across street to look for help and neighbor's dog bit on left posterior calf Went to urgent care---rabies vaccine UTD, etc Urgent care records reviewed Was treated there and BP was fairly high  Wound looks okay now--has dressed it herself Just finished the augmentin  Does check BP herself Usually okay for her No chest pain No SOB  Current Outpatient Medications on File Prior to Visit  Medication Sig Dispense Refill  . BD INTEGRA SYRINGE 25G X 1" 3 ML MISC USE 1 EVERY 30 (THIRTY) DAYS. WITH VITAMINE B 12 INJECTION 3 each 3  . Calcium Carbonate-Vit D-Min (CALCIUM 1200 PO) Take 1 tablet by mouth daily.    . citalopram (CELEXA) 20 MG tablet TAKE 1 TABLET BY MOUTH EVERY DAY 90 tablet 3  . clopidogrel (PLAVIX) 75 MG tablet TAKE 1 TABLET BY MOUTH EVERY DAY 90 tablet 3  . cyanocobalamin (,VITAMIN B-12,) 1000 MCG/ML injection INJECT 1 ML (1,000 MCG TOTAL) INTO THE MUSCLE EVERY 30 (THIRTY) DAYS. 3 mL 3  . levothyroxine (SYNTHROID) 75 MCG tablet TAKE 1 TABLET BY MOUTH EVERY DAY 90 tablet 3  . LORazepam (ATIVAN) 1 MG tablet TAKE 1/2 TO 1 TABLET BY MOUTH AT BEDTIME AS NEEDED 30 tablet 0  . metoprolol succinate (TOPROL-XL) 25 MG 24 hr tablet Take 1 tablet (25 mg total) by mouth daily. 90 tablet 3  . NEEDLE, DISP, 25 G (B-D DISP NEEDLE 25GX1") 25G X 1" MISC 1 each by Does not apply route every 30 (thirty) days. 1 each 11  . quinapril (ACCUPRIL) 10 MG tablet Take 1 tablet (10 mg total) by mouth daily. 90 tablet  3   No current facility-administered medications on file prior to visit.    Allergies  Allergen Reactions  . Sulfa Antibiotics Other (See Comments)    "urine crystallizes"  . Cephalexin Diarrhea  . Dilaudid [Hydromorphone Hcl] Nausea Only       . Xopenex [Levalbuterol Hcl] Other (See Comments)    "gives me the shakes"    Past Medical History:  Diagnosis Date  . Anemia   . Anxiety   . Arthritis   . Bundle branch block left   . CHF (congestive heart failure) (HCC)    chronic combine CHF  . Complication of anesthesia    " stomach did not wake up after back surgery "   . Corneal burn    left eye-is almost healed"Cascade pod"  . Depression   . GERD (gastroesophageal reflux disease)    tums  . Headache    hx of migraines   . Heart failure, left-sided ([place])   . Hypertension   . Hypothyroidism   . MI (myocardial infarction) ([place])   . Nonischemic cardiomyopathy ([place])    '01 EF 30% with normal coronaries; EF 45-50% 03/2012  . Shortness of breath    hx of nothing current on 01/17/2015   . Stroke [facility])    blurred vision residual    Past Surgical History:  Procedure Laterality Date  .  CARDIAC CATHETERIZATION     13 yrs. ago  . CATARACT EXTRACTION, BILATERAL Bilateral   . detached retinia    . DILATION AND CURETTAGE OF UTERUS    . EYE SURGERY     bilateral cataracts  . HERNIA REPAIR     hiatial  . hiatal hernia surgery     . LUMBAR LAMINECTOMY/DECOMPRESSION MICRODISCECTOMY  05/17/2012   Procedure: LUMBAR LAMINECTOMY/DECOMPRESSION MICRODISCECTOMY 2 LEVELS;  Surgeon: Ophelia Charter, MD;  Location: [place];  Service: Neurosurgery;  Laterality: N/A;  Lumbar four laminectomy with bilateral Lumbar three laminotomies and removal of synovial cyst  . TONSILLECTOMY    . TOTAL KNEE ARTHROPLASTY Left 01/21/2015   Procedure: LEFT TOTAL KNEE ARTHROPLASTY;  Surgeon: Paralee Cancel, MD;  [facility];  Service: Orthopedics;  Laterality: Left;  . TOTAL KNEE ARTHROPLASTY Right  07/22/2015   Procedure: RIGHT TOTAL KNEE ARTHROPLASTY;  Surgeon: Paralee Cancel, MD;  [facility]: [facility];  Service: Orthopedics;  Laterality: Right;    Family History  Problem Relation Age of Onset  . CAD Mother   . Dementia Father   . Hypertension Sister   . Diabetes Sister   . Cancer Sister     Social History   Socioeconomic History  . Marital status: Widowed    Spouse name: Not on file  . Number of children: 1  . Years of education: Not on file  . Highest education level: Not on file  Occupational History  . Occupation: Regulatory affairs officer    Comment: retired  Tobacco Use  . Smoking status: Never Smoker  . Smokeless tobacco: Never Used  Vaping Use  . Vaping Use: Never used  Substance and Sexual Activity  . Alcohol use: No  . Drug use: No  . Sexual activity: Never  Other Topics Concern  . Not on file  Social History Narrative   Widowed 2015   1 adopted son      Has living will   Son, then nephew Leslie [name], are health care POAs   Not sure about DNR--probably would accept resuscitation for now   No machine or tube feeds if cognitively unaware   Social Determinants of Health   Financial Resource Strain:   . Difficulty of Paying Living Expenses: Not on file  Food Insecurity:   . Worried About Charity fundraiser in the Last Year: Not on file  . Ran Out of Food in the Last Year: Not on file  Transportation Needs:   . Lack of Transportation (Medical): Not on file  . Lack of Transportation (Non-Medical): Not on file  Physical Activity:   . Days of Exercise per Week: Not on file  . Minutes of Exercise per Session: Not on file  Stress:   . Feeling of Stress : Not on file  Social Connections:   . Frequency of Communication with Friends and Family: Not on file  . Frequency of Social Gatherings with Friends and Family: Not on file  . Attends Religious Services: Not on file  . Active Member of Clubs or Organizations: Not on file  . Attends Archivist Meetings:  Not on file  . Marital Status: Not on file  Intimate Partner Violence:   . Fear of Current or Ex-Partner: Not on file  . Emotionally Abused: Not on file  . Physically Abused: Not on file  . Sexually Abused: Not on file   Review of Systems  No N/V Some frontal headache---mild     Objective:   Physical Exam  Skin:    Comments: 2-3 cm superficial ulcer on anterior upper left calf Clean base, no inflammation, no slough            Assessment & Plan:

## 2020-07-24 ENCOUNTER — Other Ambulatory Visit: Payer: Self-pay | Admitting: Internal Medicine

## 2020-07-24 NOTE — Telephone Encounter (Signed)
Last filled 06-25-20 #30 Last OV 07-09-20 Next OV 06-10-21 [facility]

## 2020-08-09 NOTE — Progress Notes (Deleted)
Cardiology Office Note:    Date:  08/09/2020   ID:  Leslie [name], DOB Feb 26, 1931, MRN [id number]  PCP:  [name], MD  Cardiologist:  Sinclair Grooms, MD   Referring MD: [name], MD   No chief complaint on file.   History of Present Illness:    Leslie [name] is a 87 y.o. female with a hx of  left bundle branch block, chronic diastolic heart failure (EF greater than 50%), history of CVA, and essential hypertension.  ***  Past Medical History:  Diagnosis Date  . Anemia   . Anxiety   . Arthritis   . Bundle branch block left   . CHF (congestive heart failure) (HCC)    chronic combine CHF  . Complication of anesthesia    " stomach did not wake up after back surgery "   . Corneal burn    left eye-is almost healed"Cascade pod"  . Depression   . GERD (gastroesophageal reflux disease)    tums  . Headache    hx of migraines   . Heart failure, left-sided ([place])   . Hypertension   . Hypothyroidism   . MI (myocardial infarction) ([place])   . Nonischemic cardiomyopathy ([place])    '01 EF 30% with normal coronaries; EF 45-50% 03/2012  . Shortness of breath    hx of nothing current on 01/17/2015   . Stroke [facility])    blurred vision residual    Past Surgical History:  Procedure Laterality Date  . CARDIAC CATHETERIZATION     13 yrs. ago  . CATARACT EXTRACTION, BILATERAL Bilateral   . detached retinia    . DILATION AND CURETTAGE OF UTERUS    . EYE SURGERY     bilateral cataracts  . HERNIA REPAIR     hiatial  . hiatal hernia surgery     . LUMBAR LAMINECTOMY/DECOMPRESSION MICRODISCECTOMY  05/17/2012   Procedure: LUMBAR LAMINECTOMY/DECOMPRESSION MICRODISCECTOMY 2 LEVELS;  Surgeon: Ophelia Charter, MD;  Location: [place] [facility];  Service: Neurosurgery;  Laterality: N/A;  Lumbar four laminectomy with bilateral Lumbar three laminotomies and removal of synovial cyst  . TONSILLECTOMY    . TOTAL KNEE ARTHROPLASTY Left 01/21/2015   Procedure: LEFT TOTAL KNEE  ARTHROPLASTY;  Surgeon: Paralee Cancel, MD;  [facility];  Service: Orthopedics;  Laterality: Left;  . TOTAL KNEE ARTHROPLASTY Right 07/22/2015   Procedure: RIGHT TOTAL KNEE ARTHROPLASTY;  Surgeon: Paralee Cancel, MD;  [facility];  Service: Orthopedics;  Laterality: Right;    Current Medications: No outpatient medications have been marked as taking for the 08/13/20 encounter (Appointment) with [name], MD.     Allergies:   Sulfa antibiotics, Cephalexin, Dilaudid [hydromorphone hcl], and Xopenex [levalbuterol hcl]   Social History   Socioeconomic History  . Marital status: Widowed    Spouse name: Not on file  . Number of children: 1  . Years of education: Not on file  . Highest education level: Not on file  Occupational History  . Occupation: Regulatory affairs officer    Comment: retired  Tobacco Use  . Smoking status: Never Smoker  . Smokeless tobacco: Never Used  Vaping Use  . Vaping Use: Never used  Substance and Sexual Activity  . Alcohol use: No  . Drug use: No  . Sexual activity: Never  Other Topics Concern  . Not on file  Social History Narrative   Widowed 2015   1 adopted son      Has living will  Son, then nephew Moishe Spice, are health care POAs   Not sure about DNR--probably would accept resuscitation for now   No machine or tube feeds if cognitively unaware   Social Determinants of Health   Financial Resource Strain: Not on file  Food Insecurity: Not on file  Transportation Needs: Not on file  Physical Activity: Not on file  Stress: Not on file  Social Connections: Not on file     Family History: The patient's family history includes CAD in her mother; Cancer in her sister; Dementia in her father; Diabetes in her sister; Hypertension in her sister.  ROS:   Please see the history of present illness.    *** All other systems reviewed and are negative.  EKGs/Labs/Other Studies Reviewed:    The following studies were reviewed today: ***  EKG:   EKG ***  Recent Labs: 06/06/2020: ALT 7; BUN 15; Creatinine, Ser 0.93; Hemoglobin 13.7; Platelets 195.0; Potassium 5.0; Sodium 142; TSH 6.28  Recent Lipid Panel    Component Value Date/Time   CHOL 194 07/06/2017 1300   TRIG 131.0 07/06/2017 1300   HDL 56.20 07/06/2017 1300   CHOLHDL 3 07/06/2017 1300   VLDL 26.2 07/06/2017 1300   LDLCALC 111 (H) 07/06/2017 1300    Physical Exam:    VS:  There were no vitals taken for this visit.    Wt Readings from Last 3 Encounters:  07/09/20 149 lb (67.6 kg)  06/06/20 150 lb (68 kg)  01/21/20 151 lb (68.5 kg)     GEN: ***. No acute distress HEENT: Normal NECK: No JVD. LYMPHATICS: No lymphadenopathy CARDIAC: *** murmur. RRR *** gallop, or edema. VASCULAR: *** Normal Pulses. No bruits. RESPIRATORY:  Clear to auscultation without rales, wheezing or rhonchi  ABDOMEN: Soft, non-tender, non-distended, No pulsatile mass, MUSCULOSKELETAL: No deformity  SKIN: Warm and dry NEUROLOGIC:  Alert and oriented x 3 PSYCHIATRIC:  Normal affect   ASSESSMENT:    1. Chronic combined systolic and diastolic heart failure (HCC)   2. LBBB (left bundle branch block)   3. Essential hypertension   4. History of CVA (cerebrovascular accident)   5. Stage 3b chronic kidney disease (HCC)   6. Bilateral carotid artery stenosis   7. Educated about COVID-19 virus infection    PLAN:    In order of problems listed above:  1. ***   Medication Adjustments/Labs and Tests Ordered: Current medicines are reviewed at length with the patient today.  Concerns regarding medicines are outlined above.  No orders of the defined types were placed in this encounter.  No orders of the defined types were placed in this encounter.   There are no Patient Instructions on file for this visit.   Signed, Lesleigh Noe, MD  08/09/2020 5:18 PM    [facility]

## 2020-08-11 ENCOUNTER — Ambulatory Visit: Payer: Medicare Other

## 2020-08-13 ENCOUNTER — Other Ambulatory Visit: Payer: Self-pay

## 2020-08-13 ENCOUNTER — Ambulatory Visit: Payer: Medicare Other | Admitting: Interventional Cardiology

## 2020-08-13 ENCOUNTER — Ambulatory Visit (INDEPENDENT_AMBULATORY_CARE_PROVIDER_SITE_OTHER): Payer: Medicare Other | Admitting: Interventional Cardiology

## 2020-08-13 ENCOUNTER — Encounter: Payer: Self-pay | Admitting: Interventional Cardiology

## 2020-08-13 VITALS — BP 110/82 | HR 54 | Ht 66.0 in | Wt 152.4 lb

## 2020-08-13 DIAGNOSIS — I10 Essential (primary) hypertension: Secondary | ICD-10-CM

## 2020-08-13 DIAGNOSIS — I6523 Occlusion and stenosis of bilateral carotid arteries: Secondary | ICD-10-CM

## 2020-08-13 DIAGNOSIS — Z8673 Personal history of transient ischemic attack (TIA), and cerebral infarction without residual deficits: Secondary | ICD-10-CM

## 2020-08-13 DIAGNOSIS — N1832 Chronic kidney disease, stage 3b: Secondary | ICD-10-CM

## 2020-08-13 DIAGNOSIS — I5022 Chronic systolic (congestive) heart failure: Secondary | ICD-10-CM

## 2020-08-13 DIAGNOSIS — I447 Left bundle-branch block, unspecified: Secondary | ICD-10-CM | POA: Diagnosis not present

## 2020-08-13 DIAGNOSIS — Z7189 Other specified counseling: Secondary | ICD-10-CM

## 2020-08-13 DIAGNOSIS — I5042 Chronic combined systolic (congestive) and diastolic (congestive) heart failure: Secondary | ICD-10-CM

## 2020-08-13 MED ORDER — DAPAGLIFLOZIN PROPANEDIOL 5 MG PO TABS: 5.0000 mg | ORAL_TABLET | Freq: Every day | ORAL | 3 refills | Status: DC

## 2020-08-13 MED ORDER — METOPROLOL SUCCINATE ER 25 MG PO TB24: ORAL_TABLET | ORAL | 3 refills | Status: DC

## 2020-08-13 NOTE — Progress Notes (Signed)
Cardiology Office Note:    Date:  08/13/2020   ID:  Leslie [name], DOB 1931/01/29, MRN [id number]  PCP:  [name], MD  Cardiologist:  Sinclair Grooms, MD   Referring MD: [name], MD   Chief Complaint  Patient presents with  . Atrial Fibrillation  . Congestive Heart Failure    History of Present Illness:    Leslie [name] is a 87 y.o. female with a hx of  left bundle branch block, chronic diastolic heart failure (EF greater than 50%), history of CVA, and essential hypertension.  She is accompanied by a neighbor.  They asked questions about the strength of her heart.  Last echo was 35%.  Heart failure therapy is Toprol-XL 25 mg/day.  Heart rate is in the low 50s.  She is on Zestril 10 mg/day.  Furosemide has been discontinued by her primary physician.  Not sure why.  Denies orthopnea, PND, lower extremity swelling.  Has acrocyanosis left foot greater than right.  Past Medical History:  Diagnosis Date  . Anemia   . Anxiety   . Arthritis   . Bundle branch block left   . CHF (congestive heart failure) (HCC)    chronic combine CHF  . Complication of anesthesia    " stomach did not wake up after back surgery "   . Corneal burn    left eye-is almost healed"Cascade pod"  . Depression   . GERD (gastroesophageal reflux disease)    tums  . Headache    hx of migraines   . Heart failure, left-sided ([place])   . Hypertension   . Hypothyroidism   . MI (myocardial infarction) ([place])   . Nonischemic cardiomyopathy ([place])    '01 EF 30% with normal coronaries; EF 45-50% 03/2012  . Shortness of breath    hx of nothing current on 01/17/2015   . Stroke [facility])    blurred vision residual    Past Surgical History:  Procedure Laterality Date  . CARDIAC CATHETERIZATION     13 yrs. ago  . CATARACT EXTRACTION, BILATERAL Bilateral   . detached retinia    . DILATION AND CURETTAGE OF UTERUS    . EYE SURGERY     bilateral cataracts  . HERNIA REPAIR     hiatial  . hiatal  hernia surgery     . LUMBAR LAMINECTOMY/DECOMPRESSION MICRODISCECTOMY  05/17/2012   Procedure: LUMBAR LAMINECTOMY/DECOMPRESSION MICRODISCECTOMY 2 LEVELS;  Surgeon: Ophelia Charter, MD;  Location: [place] NEUR[facility] ORS;  Service: Neurosurgery;  Laterality: N/A;  Lumbar four laminectomy with bilateral Lumbar three laminotomies and removal of synovial cyst  . TONSILLECTOMY    . TOTAL KNEE ARTHROPLASTY Left 01/21/2015   Procedure: LEFT TOTAL KNEE ARTHROPLASTY;  Surgeon: Paralee Cancel, MD;  Location: [facility];  Service: Orthopedics;  Laterality: Left;  . TOTAL KNEE ARTHROPLASTY Right 07/22/2015   Procedure: RIGHT TOTAL KNEE ARTHROPLASTY;  Surgeon: Paralee Cancel, MD;  Location: [facility];  Service: Orthopedics;  Laterality: Right;    Current Medications: Current Meds  Medication Sig  . BD INTEGRA SYRINGE 25G X 1" 3 ML MISC USE 1 EVERY 30 (THIRTY) DAYS. WITH VITAMINE B 12 INJECTION  . Calcium Carbonate-Vit D-Min (CALCIUM 1200 PO) Take 1 tablet by mouth daily.  . citalopram (CELEXA) 20 MG tablet TAKE 1 TABLET BY MOUTH EVERY DAY  . clopidogrel (PLAVIX) 75 MG tablet TAKE 1 TABLET BY MOUTH EVERY DAY  . cyanocobalamin (,VITAMIN B-12,) 1000 MCG/ML injection INJECT 1 ML (1,000 MCG TOTAL)  INTO THE MUSCLE EVERY 30 (THIRTY) DAYS.  Marland Kitchen dapagliflozin propanediol (FARXIGA) 5 MG TABS tablet Take 1 tablet (5 mg total) by mouth daily before breakfast.  . levothyroxine (SYNTHROID) 75 MCG tablet TAKE 1 TABLET BY MOUTH EVERY DAY  . LORazepam (ATIVAN) 1 MG tablet TAKE 1/2 TO 1 TABLET BY MOUTH AT BEDTIME AS NEEDED  . NEEDLE, DISP, 25 G (B-D DISP NEEDLE 25GX1") 25G X 1" MISC 1 each by Does not apply route every 30 (thirty) days.  . quinapril (ACCUPRIL) 10 MG tablet Take 1 tablet (10 mg total) by mouth daily.  . [DISCONTINUED] metoprolol succinate (TOPROL-XL) 25 MG 24 hr tablet Take 1 tablet (25 mg total) by mouth daily.     Allergies:   Sulfa antibiotics, Cephalexin, Dilaudid [hydromorphone hcl], and Xopenex [levalbuterol hcl]    Social History   Socioeconomic History  . Marital status: Widowed    Spouse name: Not on file  . Number of children: 1  . Years of education: Not on file  . Highest education level: Not on file  Occupational History  . Occupation: Regulatory affairs officer    Comment: retired  Tobacco Use  . Smoking status: Never Smoker  . Smokeless tobacco: Never Used  Vaping Use  . Vaping Use: Never used  Substance and Sexual Activity  . Alcohol use: No  . Drug use: No  . Sexual activity: Never  Other Topics Concern  . Not on file  Social History Narrative   Widowed 2015   1 adopted son      Has living will   Son, then nephew [name], are health care POAs   Not sure about DNR--probably would accept resuscitation for now   No machine or tube feeds if cognitively unaware   Social Determinants of Health   Financial Resource Strain: Not on file  Food Insecurity: Not on file  Transportation Needs: Not on file  Physical Activity: Not on file  Stress: Not on file  Social Connections: Not on file     Family History: The patient's family history includes CAD in her mother; Cancer in her sister; Dementia in her father; Diabetes in her sister; Hypertension in her sister.  ROS:   Please see the history of present illness.    Feels tired.  Legs burn at night when she lays down in bed.  Has low back discomfort.  All other systems reviewed and are negative.  EKGs/Labs/Other Studies Reviewed:    The following studies were reviewed today:  2D Doppler echocardiogram November 2020: IMPRESSIONS  1. Left ventricular ejection fraction, by visual estimation, is 30 to  35%. The left ventricle has moderate to severely decreased function. Left  ventricular septal wall thickness was normal. There is moderately  increased left ventricular hypertrophy.  2. Moderately dilated left ventricular internal cavity size.  3. Global right ventricle has normal systolic function.The right  ventricular size is  normal. No increase in right ventricular wall  thickness.  4. Left atrial size was moderately dilated.  5. Right atrial size was normal.  6. Moderate calcification of the mitral valve leaflet(s).  7. Moderate mitral annular calcification.  8. Moderate thickening of the mitral valve leaflet(s).  9. The mitral valve is normal in structure. Mild mitral valve  regurgitation.  10. The tricuspid valve is normal in structure. Tricuspid valve  regurgitation is mild.  11. Aortic valve regurgitation is moderate.  12. The aortic valve is tricuspid. Aortic valve regurgitation is moderate.  Mild to moderate aortic valve sclerosis/calcification without  any evidence  of aortic stenosis.  13. The pulmonic valve was grossly normal. Pulmonic valve regurgitation is  mild.  14. Mildly elevated pulmonary artery systolic pressure.   EKG:  EKG sinus bradycardia, left bundle branch block, right axis deviation.  Normal PR interval.  Compared to prior  Recent Labs: 06/06/2020: ALT 7; BUN 15; Creatinine, Ser 0.93; Hemoglobin 13.7; Platelets 195.0; Potassium 5.0; Sodium 142; TSH 6.28  Recent Lipid Panel    Component Value Date/Time   CHOL 194 07/06/2017 1300   TRIG 131.0 07/06/2017 1300   HDL 56.20 07/06/2017 1300   CHOLHDL 3 07/06/2017 1300   VLDL 26.2 07/06/2017 1300   LDLCALC 111 (H) 07/06/2017 1300    Physical Exam:    VS:  BP 110/82   Pulse (!) 54   Ht 5\' 6"  (1.676 m)   Wt 152 lb 6.4 oz (69.1 kg)   SpO2 96%   BMI 24.60 kg/m     Wt Readings from Last 3 Encounters:  08/13/20 152 lb 6.4 oz (69.1 kg)  07/09/20 149 lb (67.6 kg)  06/06/20 150 lb (68 kg)     GEN: Compatible with age. No acute distress HEENT: Normal NECK: No JVD. LYMPHATICS: No lymphadenopathy CARDIAC: No murmur. RRR no gallop, or edema. VASCULAR:  Normal Pulses. No bruits. RESPIRATORY:  Clear to auscultation without rales, wheezing or rhonchi  ABDOMEN: Soft, non-tender, non-distended, No pulsatile  mass, MUSCULOSKELETAL: No deformity  SKIN: Warm and dry NEUROLOGIC:  Alert and oriented x 3 PSYCHIATRIC:  Normal affect   ASSESSMENT:    1. Chronic diastolic HF (heart failure) ([place])   2. Essential hypertension   3. LBBB (left bundle branch block)   4. History of CVA (cerebrovascular accident)   5. Educated about COVID-19 virus infection    PLAN:    In order of problems listed above:  1. No evidence of volume overload.  EF had dropped at last echo in 2020 to 35%.  This could in part be related to left ventricular dyssynchrony from left bundle.  Furosemide has been discontinued by primary care, uncertain reasons.  Continue low-dose metoprolol succinate 25 mg Monday, Wednesday, Friday, and Saturday.  Continue low-dose Accupril 10 mg/day.  She has had an issue with hyperkalemia and therefore cannot use spironolactone.  We will add dapagliflozin/Farxiga 5 mg/day.  Clinical follow-up in a month.  Bmet on return.  Depending upon potassium level, may decide to either increase ACE intensity, discontinue ACE and start low-dose Entresto, or add mineralocorticoid receptor antagonist. 2. Blood pressure repeated is 140/82.  Heart rate is slow.  Will decrease effective metoprolol dose to less than 25 mg/day for greater than 12.5/day. 3. Left bundle may be contributing to systolic dysfunction. 4. Did not discuss 5. Vaccinated and practicing social distancing.  Consider repeating echocardiogram at some point in 2022.  Guideline directed therapy for left ventricular systolic dysfunction: Angiotensin receptor-neprilysin inhibitor (ARNI)-Entresto; beta-blocker therapy - carvedilol, metoprolol succinate, or bisoprolol; mineralocorticoid receptor antagonist (MRA) therapy -spironolactone or eplerenone.  SGLT-2 agents -  Dapagliflozin Wilder Glade) or Empagliflozin (Jardiance).These therapies have been shown to improve clinical outcomes including reduction of rehospitalization, survival, and acute heart  failure.HF   Medication Adjustments/Labs and Tests Ordered: Current medicines are reviewed at length with the patient today.  Concerns regarding medicines are outlined above.  Orders Placed This Encounter  Procedures  . EKG 12-Lead   Meds ordered this encounter  Medications  . metoprolol succinate (TOPROL-XL) 25 MG 24 hr tablet    Sig: Take one tablet  by mouth on Monday, Wednesday, Friday and Saturday    Dispense:  60 tablet    Refill:  3    Dose change  . dapagliflozin propanediol (FARXIGA) 5 MG TABS tablet    Sig: Take 1 tablet (5 mg total) by mouth daily before breakfast.    Dispense:  90 tablet    Refill:  3    Patient Instructions  Medication Instructions:  1) DECREASE Metoprolol to 25mg  once daily on Monday, Wednesday, Friday and Saturday. 2) START Tuesday 5mg  once daily  *If you need a refill on your cardiac medications before your next appointment, please call your pharmacy*   Lab Work: None If you have labs (blood work) drawn today and your tests are completely normal, you will receive your results only by: Marcelline Deist MyChart Message (if you have MyChart) OR . A paper copy in the mail If you have any lab test that is abnormal or we need to change your treatment, we will call you to review the results.   Testing/Procedures: None   Follow-Up: At [facility], you and your health needs are our priority.  As part of our continuing mission to provide you with exceptional heart care, we have created designated Provider Care Teams.  These Care Teams include your primary Cardiologist (physician) and Advanced Practice Providers (APPs -  Physician Assistants and Nurse Practitioners) who all work together to provide you with the care you need, when you need it.  We recommend signing up for the patient portal called "MyChart".  Sign up information is provided on this After Visit Summary.  MyChart is used to connect with patients for Virtual Visits (Telemedicine).  Patients are able  to view lab/test results, encounter notes, upcoming appointments, etc.  Non-urgent messages can be sent to your provider as well.   To learn more about what you can do with MyChart, go to Marland Kitchen.    Your next appointment:   1 month(s)  The format for your next appointment:   In Person  Provider:   You may see [facility], MD or one of the following Advanced Practice Providers on your designated Care Team:    [email or web address], NP  Lesleigh Noe, NP  Norma Fredrickson, NP    Other Instructions      Signed, Nada Boozer, MD  08/13/2020 5:02 PM    [facility]

## 2020-08-13 NOTE — Patient Instructions (Signed)
Medication Instructions:  1) DECREASE Metoprolol to 25mg  once daily on Monday, Wednesday, Friday and Saturday. 2) START Thursday 5mg  once daily  *If you need a refill on your cardiac medications before your next appointment, please call your pharmacy*   Lab Work: None If you have labs (blood work) drawn today and your tests are completely normal, you will receive your results only by: Marcelline Deist MyChart Message (if you have MyChart) OR . A paper copy in the mail If you have any lab test that is abnormal or we need to change your treatment, we will call you to review the results.   Testing/Procedures: None   Follow-Up: At [facility], you and your health needs are our priority.  As part of our continuing mission to provide you with exceptional heart care, we have created designated Provider Care Teams.  These Care Teams include your primary Cardiologist (physician) and Advanced Practice Providers (APPs -  Physician Assistants and Nurse Practitioners) who all work together to provide you with the care you need, when you need it.  We recommend signing up for the patient portal called "MyChart".  Sign up information is provided on this After Visit Summary.  MyChart is used to connect with patients for Virtual Visits (Telemedicine).  Patients are able to view lab/test results, encounter notes, upcoming appointments, etc.  Non-urgent messages can be sent to your provider as well.   To learn more about what you can do with MyChart, go to Marland Kitchen.    Your next appointment:   1 month(s)  The format for your next appointment:   In Person  Provider:   You may see [facility], MD or one of the following Advanced Practice Providers on your designated Care Team:    [email or web address], NP  Lesleigh Noe, NP  Norma Fredrickson, NP    Other Instructions

## 2020-08-18 ENCOUNTER — Ambulatory Visit: Payer: Medicare Other | Attending: Internal Medicine

## 2020-08-18 DIAGNOSIS — Z23 Encounter for immunization: Secondary | ICD-10-CM

## 2020-08-18 NOTE — Progress Notes (Signed)
   Covid-19 Vaccination Clinic  Name:  TELLY BROBERG    MRN: [id number] DOB: [birth year]  08/18/2020  Ms. Ratledge was observed post Covid-19 immunization for 15 minutes without incident. She was provided with Vaccine Information Sheet and instruction to access the V-Safe system.   Ms. Alcaide was instructed to call 911 with any severe reactions post vaccine: Marland Kitchen Difficulty breathing  . Swelling of face and throat  . A fast heartbeat  . A bad rash all over body  . Dizziness and weakness   Immunizations Administered    Name Date Dose VIS Date Route   Pfizer COVID-19 Vaccine 08/18/2020  1:21 PM 0.3 mL 05/28/2020 Intramuscular   Manufacturer: [place]   Lot: [id number]   NDC: [address]-4

## 2020-08-20 ENCOUNTER — Other Ambulatory Visit: Payer: Self-pay | Admitting: Internal Medicine

## 2020-08-26 ENCOUNTER — Other Ambulatory Visit: Payer: Self-pay | Admitting: Internal Medicine

## 2020-08-27 NOTE — Telephone Encounter (Signed)
Last filled 07-24-20 #30 Last OV 07-09-20 Next OV 06-10-21 [facility]

## 2020-09-14 NOTE — Progress Notes (Signed)
Cardiology Office Note:    Date:  09/15/2020   ID:  Leslie [name], DOB 04-24-1931, MRN [id number]  PCP:  [name], MD  Cardiologist:  Sinclair Grooms, MD   Referring MD: [name], MD   Chief Complaint  Patient presents with  . Congestive Heart Failure    History of Present Illness:    Leslie [name] is a 87 y.o. female with a hx of left bundle branch block, chronic diastolic heart failure (EF greater than 50%) progressing to systolic heart failure with EF 30 to 35% 2020, history of CVA, and essential hypertension.  Leslie [name] is back today to follow-up systolic heart failure.  Recent documentation of decreasing LV function in 2020 she denies orthopnea, angina, and syncope.  Says she feels weaker now since starting Farxiga 5 mg/day.  All in all though, there is not a major change.  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  She does stuff in the yard.  She is not having significant orthopnea.  No lower extremity swelling.  Past Medical History:  Diagnosis Date  . Anemia   . Anxiety   . Arthritis   . Bundle branch block left   . CHF (congestive heart failure) (HCC)    chronic combine CHF  . Complication of anesthesia    " stomach did not wake up after back surgery "   . Corneal burn    left eye-is almost healed"Cascade pod"  . Depression   . GERD (gastroesophageal reflux disease)    tums  . Headache    hx of migraines   . Heart failure, left-sided ([place])   . Hypertension   . Hypothyroidism   . MI (myocardial infarction) ([place])   . Nonischemic cardiomyopathy ([place])    '01 EF 30% with normal coronaries; EF 45-50% 03/2012  . Shortness of breath    hx of nothing current on 01/17/2015   . Stroke [facility])    blurred vision residual    Past Surgical History:  Procedure Laterality Date  . CARDIAC CATHETERIZATION     13 yrs. ago  . CATARACT EXTRACTION, BILATERAL Bilateral   . detached retinia    . DILATION AND CURETTAGE OF UTERUS    . EYE SURGERY     bilateral cataracts   . HERNIA REPAIR     hiatial  . hiatal hernia surgery     . LUMBAR LAMINECTOMY/DECOMPRESSION MICRODISCECTOMY  05/17/2012   Procedure: LUMBAR LAMINECTOMY/DECOMPRESSION MICRODISCECTOMY 2 LEVELS;  Surgeon: Ophelia Charter, MD;  Location: [place] NEUR[facility] ORS;  Service: Neurosurgery;  Laterality: N/A;  Lumbar four laminectomy with bilateral Lumbar three laminotomies and removal of synovial cyst  . TONSILLECTOMY    . TOTAL KNEE ARTHROPLASTY Left 01/21/2015   Procedure: LEFT TOTAL KNEE ARTHROPLASTY;  Surgeon: Paralee Cancel, MD;  [facility];  Service: Orthopedics;  Laterality: Left;  . TOTAL KNEE ARTHROPLASTY Right 07/22/2015   Procedure: RIGHT TOTAL KNEE ARTHROPLASTY;  Surgeon: Paralee Cancel, MD;  [facility];  Service: Orthopedics;  Laterality: Right;    Current Medications: Current Meds  Medication Sig  . BD INTEGRA SYRINGE 25G X 1" 3 ML MISC USE 1 EVERY 30 (THIRTY) DAYS. WITH VITAMINE B 12 INJECTION  . Calcium Carbonate-Vit D-Min (CALCIUM 1200 PO) Take 1 tablet by mouth daily.  . citalopram (CELEXA) 20 MG tablet TAKE 1 TABLET BY MOUTH EVERY DAY  . clopidogrel (PLAVIX) 75 MG tablet TAKE 1 TABLET BY MOUTH EVERY DAY  . cyanocobalamin (,VITAMIN B-12,) 1000 MCG/ML injection  INJECT 1 ML (1,000 MCG TOTAL) INTO THE MUSCLE EVERY 30 (THIRTY) DAYS.  Marland Kitchen dapagliflozin propanediol (FARXIGA) 5 MG TABS tablet Take 1 tablet (5 mg total) by mouth daily before breakfast.  . levothyroxine (SYNTHROID) 75 MCG tablet TAKE 1 TABLET BY MOUTH EVERY DAY  . LORazepam (ATIVAN) 1 MG tablet TAKE 1/2 TO 1 TABLET BY MOUTH AT BEDTIME AS NEEDED  . metoprolol succinate (TOPROL-XL) 25 MG 24 hr tablet Take one tablet by mouth on Monday, Wednesday, Friday and Saturday  . NEEDLE, DISP, 25 G (B-D DISP NEEDLE 25GX1") 25G X 1" MISC 1 each by Does not apply route every 30 (thirty) days.  . quinapril (ACCUPRIL) 10 MG tablet Take 1 tablet (10 mg total) by mouth daily.     Allergies:   Sulfa antibiotics, Cephalexin, Dilaudid  [hydromorphone hcl], and Xopenex [levalbuterol hcl]   Social History   Socioeconomic History  . Marital status: Widowed    Spouse name: Not on file  . Number of children: 1  . Years of education: Not on file  . Highest education level: Not on file  Occupational History  . Occupation: Regulatory affairs officer    Comment: retired  Tobacco Use  . Smoking status: Never Smoker  . Smokeless tobacco: Never Used  Vaping Use  . Vaping Use: Never used  Substance and Sexual Activity  . Alcohol use: No  . Drug use: No  . Sexual activity: Never  Other Topics Concern  . Not on file  Social History Narrative   Widowed 2015   1 adopted son      Has living will   Son, then nephew [name], are health care POAs   Not sure about DNR--probably would accept resuscitation for now   No machine or tube feeds if cognitively unaware   Social Determinants of Health   Financial Resource Strain: Not on file  Food Insecurity: Not on file  Transportation Needs: Not on file  Physical Activity: Not on file  Stress: Not on file  Social Connections: Not on file     Family History: The patient's family history includes CAD in her mother; Cancer in her sister; Dementia in her father; Diabetes in her sister; Hypertension in her sister.  ROS:   Please see the history of present illness.    Appetite is stable.  She has a generalized sense of fatigue.  All other systems reviewed and are negative.  EKGs/Labs/Other Studies Reviewed:    The following studies were reviewed today: No new data  EKG:  EKG not repeated  Recent Labs: 06/06/2020: ALT 7; BUN 15; Creatinine, Ser 0.93; Hemoglobin 13.7; Platelets 195.0; Potassium 5.0; Sodium 142; TSH 6.28  Recent Lipid Panel    Component Value Date/Time   CHOL 194 07/06/2017 1300   TRIG 131.0 07/06/2017 1300   HDL 56.20 07/06/2017 1300   CHOLHDL 3 07/06/2017 1300   VLDL 26.2 07/06/2017 1300   LDLCALC 111 (H) 07/06/2017 1300    Physical Exam:    VS:  BP (!)  158/84   Pulse 76   Ht 5\' 6"  (1.676 m)   Wt 153 lb 6.4 oz (69.6 kg)   SpO2 96%   BMI 24.76 kg/m     Wt Readings from Last 3 Encounters:  09/15/20 153 lb 6.4 oz (69.6 kg)  08/13/20 152 lb 6.4 oz (69.1 kg)  07/09/20 149 lb (67.6 kg)     GEN: CV wave noted in right jugular.. No acute distress HEENT: Normal NECK: No JVD. LYMPHATICS: No lymphadenopathy  CARDIAC: Left parasternal soft holosystolic murmur. RRR no gallop, or edema. VASCULAR:  Normal Pulses. No bruits. RESPIRATORY:  Clear to auscultation without rales, wheezing or rhonchi  ABDOMEN: Soft, non-tender, non-distended, No pulsatile mass, MUSCULOSKELETAL: No deformity  SKIN: Warm and dry NEUROLOGIC:  Alert and oriented x 3 PSYCHIATRIC:  Normal affect   ASSESSMENT:    1. Chronic systolic heart failure ([place])   2. Essential hypertension   3. LBBB (left bundle branch block)   4. History of CVA (cerebrovascular accident)   5. Educated about COVID-19 virus infection    PLAN:    In order of problems listed above:  1. Continue Farxiga 5 mg/day until today's blood work is known.  Change metoprolol succinate to 12.5 mg every day.  Continue Accupril 10 mg/day.  Consider adding low-dose Aldactone 12.5 mg/day if today's laboratory data allows. 2. Systolic pressure here in the office today is high.  She did bring a blood pressure list from home and she generally runs between 130 to [id number] mmHg systolic.  Somewhat concerned about having more aggressive therapy that can lower blood pressure at her age. 3. Potentially could be a candidate for resynchronization therapy although age would make that less prudent.  We will circle back to this in the future if needed 4. No new neurological complaints 5. Vaccinated and practicing social mitigation.  Guideline directed therapy for left ventricular systolic dysfunction: Angiotensin receptor-neprilysin inhibitor (ARNI)-Entresto; beta-blocker therapy - carvedilol, metoprolol succinate, or bisoprolol;  mineralocorticoid receptor antagonist (MRA) therapy -spironolactone or eplerenone.  SGLT-2 agents -  Dapagliflozin Wilder Glade) or Empagliflozin (Jardiance).These therapies have been shown to improve clinical outcomes including reduction of rehospitalization, survival, and acute heart failure.    Medication Adjustments/Labs and Tests Ordered: Current medicines are reviewed at length with the patient today.  Concerns regarding medicines are outlined above.  No orders of the defined types were placed in this encounter.  No orders of the defined types were placed in this encounter.   There are no Patient Instructions on file for this visit.   Signed, Sinclair Grooms, MD  09/15/2020 3:09 PM    [facility]

## 2020-09-15 ENCOUNTER — Other Ambulatory Visit: Payer: Self-pay

## 2020-09-15 ENCOUNTER — Encounter: Payer: Self-pay | Admitting: Interventional Cardiology

## 2020-09-15 ENCOUNTER — Ambulatory Visit (INDEPENDENT_AMBULATORY_CARE_PROVIDER_SITE_OTHER): Payer: Medicare Other | Admitting: Interventional Cardiology

## 2020-09-15 VITALS — BP 158/84 | HR 76 | Ht 66.0 in | Wt 153.4 lb

## 2020-09-15 DIAGNOSIS — I10 Essential (primary) hypertension: Secondary | ICD-10-CM

## 2020-09-15 DIAGNOSIS — Z7189 Other specified counseling: Secondary | ICD-10-CM

## 2020-09-15 DIAGNOSIS — Z8673 Personal history of transient ischemic attack (TIA), and cerebral infarction without residual deficits: Secondary | ICD-10-CM | POA: Diagnosis not present

## 2020-09-15 DIAGNOSIS — I5022 Chronic systolic (congestive) heart failure: Secondary | ICD-10-CM | POA: Diagnosis not present

## 2020-09-15 DIAGNOSIS — I447 Left bundle-branch block, unspecified: Secondary | ICD-10-CM

## 2020-09-15 MED ORDER — METOPROLOL SUCCINATE ER 25 MG PO TB24: 12.5000 mg | ORAL_TABLET | Freq: Every day | ORAL | 3 refills | Status: DC

## 2020-09-15 NOTE — Patient Instructions (Signed)
Medication Instructions:  Your physician has recommended you make the following change in your medication:   START taking Toprol Xl 12.5mg  (1/2 tablet) daily.   *If you need a refill on your cardiac medications before your next appointment, please call your pharmacy*   Lab Work: TODAY: BMET If you have labs (blood work) drawn today and your tests are completely normal, you will receive your results only by: Marland Kitchen MyChart Message (if you have MyChart) OR . A paper copy in the mail If you have any lab test that is abnormal or we need to change your treatment, we will call you to review the results.   Testing/Procedures: none   Follow-Up: At [facility], you and your health needs are our priority.  As part of our continuing mission to provide you with exceptional heart care, we have created designated Provider Care Teams.  These Care Teams include your primary Cardiologist (physician) and Advanced Practice Providers (APPs -  Physician Assistants and Nurse Practitioners) who all work together to provide you with the care you need, when you need it.  We recommend signing up for the patient portal called "MyChart".  Sign up information is provided on this After Visit Summary.  MyChart is used to connect with patients for Virtual Visits (Telemedicine).  Patients are able to view lab/test results, encounter notes, upcoming appointments, etc.  Non-urgent messages can be sent to your provider as well.   To learn more about what you can do with MyChart, go to [email or web address].    Your next appointment:   3-4 months   The format for your next appointment:   In Person  Provider:   You may see Sinclair Grooms, MD or one of the following Advanced Practice Providers on your designated Care Team:    Kathyrn Drown, NP    Other Instructions -Your physician request that you measure your blood pressure 2-4 hours after morning dose of blood pressure medication. He recommends checking 2-3  days a week. -Target blood pressure is 140/80, but less than 150/80 is acceptable.

## 2020-09-16 ENCOUNTER — Other Ambulatory Visit: Payer: Self-pay | Admitting: Interventional Cardiology

## 2020-09-16 ENCOUNTER — Other Ambulatory Visit: Payer: Self-pay | Admitting: Internal Medicine

## 2020-09-16 LAB — BASIC METABOLIC PANEL
BUN/Creatinine Ratio: 17 (ref 12–28)
BUN: 16 mg/dL (ref 8–27)
CO2: 23 mmol/L (ref 20–29)
Calcium: 9.4 mg/dL (ref 8.7–10.3)
Chloride: 107 mmol/L — ABNORMAL HIGH (ref 96–106)
Creatinine, Ser: 0.94 mg/dL (ref 0.57–1.00)
GFR calc Af Amer: 63 mL/min/{1.73_m2} (ref >59)
GFR calc non Af Amer: 54 mL/min/{1.73_m2} — ABNORMAL LOW (ref >59)
Glucose: 81 mg/dL (ref 65–99)
Potassium: 4.8 mmol/L (ref 3.5–5.2)
Sodium: 145 mmol/L — ABNORMAL HIGH (ref 134–144)

## 2020-09-16 MED ORDER — DAPAGLIFLOZIN PROPANEDIOL 5 MG PO TABS: 5.0000 mg | ORAL_TABLET | Freq: Every day | ORAL | 3 refills | Status: DC

## 2020-09-16 NOTE — Telephone Encounter (Signed)
Ok to fill but prescription was sent in for 90 days on 1/5 with 3 refills so shouldn't need one.

## 2020-09-16 NOTE — Telephone Encounter (Signed)
*  STAT* If patient is at the pharmacy, call can be transferred to refill team.   1. Which medications need to be refilled? (please list name of each medication and dose if known)  dapagliflozin propanediol (FARXIGA) 5 MG TABS tablet  2. Which pharmacy/location (including street and city if local pharmacy) is medication to be sent to? [id number] [place], [place] - [address]  3. Do they need a 30 day or 90 day supply? [place]

## 2020-09-19 ENCOUNTER — Telehealth: Payer: Self-pay | Admitting: *Deleted

## 2020-09-19 DIAGNOSIS — I5022 Chronic systolic (congestive) heart failure: Secondary | ICD-10-CM

## 2020-09-19 MED ORDER — SPIRONOLACTONE 25 MG PO TABS: ORAL_TABLET | ORAL | 3 refills | Status: DC

## 2020-09-19 NOTE — Telephone Encounter (Signed)
Spoke with pt and made her aware of results and recommendations.  Pt will come for labs on 2/25.  Pt verbalized understanding and was in agreement with plan.

## 2020-09-19 NOTE — Telephone Encounter (Signed)
-----   Message from [name], MD sent at 09/18/2020  1:50 PM EST ----- Let the patient know the labs are normal. Please add spironolactone 12.5 mg Monday and Thursdays. In 2 weeks, check basic metabolic panel A copy will be sent to [name], MD

## 2020-09-29 ENCOUNTER — Other Ambulatory Visit: Payer: Self-pay | Admitting: Internal Medicine

## 2020-09-29 NOTE — Telephone Encounter (Signed)
Last filled 08-27-20 #30 Last OV 07-09-20 Next OV 06-10-21 [facility] [place]

## 2020-10-03 ENCOUNTER — Other Ambulatory Visit: Payer: Medicare Other | Admitting: *Deleted

## 2020-10-03 ENCOUNTER — Other Ambulatory Visit: Payer: Self-pay

## 2020-10-03 DIAGNOSIS — I5022 Chronic systolic (congestive) heart failure: Secondary | ICD-10-CM

## 2020-10-04 LAB — BASIC METABOLIC PANEL
BUN/Creatinine Ratio: 18 (ref 12–28)
BUN: 17 mg/dL (ref 8–27)
CO2: 22 mmol/L (ref 20–29)
Calcium: 9.1 mg/dL (ref 8.7–10.3)
Chloride: 108 mmol/L — ABNORMAL HIGH (ref 96–106)
Creatinine, Ser: 0.97 mg/dL (ref 0.57–1.00)
GFR calc Af Amer: 60 mL/min/{1.73_m2} (ref >59)
GFR calc non Af Amer: 52 mL/min/{1.73_m2} — ABNORMAL LOW (ref >59)
Glucose: 92 mg/dL (ref 65–99)
Potassium: 4.5 mmol/L (ref 3.5–5.2)
Sodium: 143 mmol/L (ref 134–144)

## 2020-10-19 ENCOUNTER — Other Ambulatory Visit: Payer: Self-pay | Admitting: Internal Medicine

## 2020-11-10 ENCOUNTER — Other Ambulatory Visit: Payer: Self-pay | Admitting: Internal Medicine

## 2020-11-10 NOTE — Telephone Encounter (Signed)
Last filled 09-29-20 #30 Last OV 06-06-20 Next OV 06-10-21 [facility] [place]

## 2020-12-09 ENCOUNTER — Other Ambulatory Visit: Payer: Self-pay | Admitting: Internal Medicine

## 2020-12-10 NOTE — Telephone Encounter (Signed)
Last filled 11-10-20 #30 Last OV 06-06-20 Next OV 06-10-21 [facility] [place]

## 2021-01-02 ENCOUNTER — Ambulatory Visit: Payer: Medicare Other | Admitting: Interventional Cardiology

## 2021-01-08 ENCOUNTER — Other Ambulatory Visit: Payer: Self-pay | Admitting: Internal Medicine

## 2021-01-08 NOTE — Telephone Encounter (Signed)
Last filled 12-10-20 #30 Last OV 06-06-20 Next OV 06-10-21 [facility] [place]

## 2021-01-13 NOTE — Progress Notes (Signed)
Cardiology Office Note:    Date:  01/14/2021   ID:  Leslie [name], DOB 06-23-1931, MRN [id number]  PCP:  [name], MD  Cardiologist:  Sinclair Grooms, MD   Referring MD: [name], MD   Chief Complaint  Patient presents with  . Congestive Heart Failure    History of Present Illness:    Leslie [name] is a 87 y.o. female with a hx of left bundle branch block, chronic diastolic heart failure (EF greater than 50%) progressing to systolic heart failure with EF 30 to 35% 2020, history of CVA, and essential hypertension.  Recently, she has began feeling better.  She denies angina.  She has not had syncope.  She worked some in her yard over the past several days and this is uplifted her spirits.  She is able to lay flat.  There is no peripheral edema.  Past Medical History:  Diagnosis Date  . Anemia   . Anxiety   . Arthritis   . Bundle branch block left   . CHF (congestive heart failure) (HCC)    chronic combine CHF  . Complication of anesthesia    " stomach did not wake up after back surgery "   . Corneal burn    left eye-is almost healed"[place]"  . Depression   . GERD (gastroesophageal reflux disease)    tums  . Headache    hx of migraines   . Heart failure, left-sided ([place])   . Hypertension   . Hypothyroidism   . MI (myocardial infarction) ([place])   . Nonischemic cardiomyopathy ([place])    '01 EF 30% with normal coronaries; EF 45-50% 03/2012  . Shortness of breath    hx of nothing current on 01/17/2015   . Stroke [facility])    blurred vision residual    Past Surgical History:  Procedure Laterality Date  . CARDIAC CATHETERIZATION     13 yrs. ago  . CATARACT EXTRACTION, BILATERAL Bilateral   . detached retinia    . DILATION AND CURETTAGE OF UTERUS    . EYE SURGERY     bilateral cataracts  . HERNIA REPAIR     hiatial  . hiatal hernia surgery     . LUMBAR LAMINECTOMY/DECOMPRESSION MICRODISCECTOMY  05/17/2012   Procedure: LUMBAR  LAMINECTOMY/DECOMPRESSION MICRODISCECTOMY 2 LEVELS;  Surgeon: Ophelia Charter, MD;  Location: [facility];  Service: Neurosurgery;  Laterality: N/A;  Lumbar four laminectomy with bilateral Lumbar three laminotomies and removal of synovial cyst  . TONSILLECTOMY    . TOTAL KNEE ARTHROPLASTY Left 01/21/2015   Procedure: LEFT TOTAL KNEE ARTHROPLASTY;  Surgeon: Paralee Cancel, MD;  Location: [facility];  Service: Orthopedics;  Laterality: Left;  . TOTAL KNEE ARTHROPLASTY Right 07/22/2015   Procedure: RIGHT TOTAL KNEE ARTHROPLASTY;  Surgeon: Paralee Cancel, MD;  Location: [facility];  Service: Orthopedics;  Laterality: Right;    Current Medications: Current Meds  Medication Sig  . BD INTEGRA SYRINGE 25G X 1" 3 ML MISC USE 1 EVERY 30 (THIRTY) DAYS. WITH VITAMINE B 12 INJECTION  . Calcium Carbonate-Vit D-Min (CALCIUM 1200 PO) Take 1 tablet by mouth daily.  . citalopram (CELEXA) 20 MG tablet TAKE 1 TABLET BY MOUTH EVERY DAY  . clopidogrel (PLAVIX) 75 MG tablet TAKE 1 TABLET BY MOUTH EVERY DAY  . cyanocobalamin (,VITAMIN B-12,) 1000 MCG/ML injection INJECT 1 ML (1,000 MCG TOTAL) INTO THE MUSCLE EVERY 30 (THIRTY) DAYS.  Marland Kitchen levothyroxine (SYNTHROID) 75 MCG tablet TAKE 1 TABLET BY MOUTH  EVERY DAY  . LORazepam (ATIVAN) 1 MG tablet TAKE 1/2 TO 1 TABLET BY MOUTH AT BEDTIME AS NEEDED  . metoprolol succinate (TOPROL XL) 25 MG 24 hr tablet Take 0.5 tablets (12.5 mg total) by mouth daily.  Marland Kitchen NEEDLE, DISP, 25 G (B-D DISP NEEDLE 25GX1") 25G X 1" MISC 1 each by Does not apply route every 30 (thirty) days.  . quinapril (ACCUPRIL) 10 MG tablet TAKE 1 TABLET BY MOUTH EVERY DAY  . spironolactone (ALDACTONE) 25 MG tablet Take a half tablet (12.5mg  total) by mouth once daily on Monday and Thursday  . [DISCONTINUED] dapagliflozin propanediol (FARXIGA) 5 MG TABS tablet Take 1 tablet (5 mg total) by mouth daily before breakfast.     Allergies:   Sulfa antibiotics, Cephalexin, Dilaudid [hydromorphone hcl], and Xopenex [levalbuterol  hcl]   Social History   Socioeconomic History  . Marital status: Widowed    Spouse name: Not on file  . Number of children: 1  . Years of education: Not on file  . Highest education level: Not on file  Occupational History  . Occupation: Regulatory affairs officer    Comment: retired  Tobacco Use  . Smoking status: Never Smoker  . Smokeless tobacco: Never Used  Vaping Use  . Vaping Use: Never used  Substance and Sexual Activity  . Alcohol use: No  . Drug use: No  . Sexual activity: Never  Other Topics Concern  . Not on file  Social History Narrative   Widowed 2015   1 adopted son      Has living will   Son, then nephew [name], are health care POAs   Not sure about DNR--probably would accept resuscitation for now   No machine or tube feeds if cognitively unaware   Social Determinants of Health   Financial Resource Strain: Not on file  Food Insecurity: Not on file  Transportation Needs: Not on file  Physical Activity: Not on file  Stress: Not on file  Social Connections: Not on file     Family History: The patient's family history includes CAD in her mother; Cancer in her sister; Dementia in her father; Diabetes in her sister; Hypertension in her sister.  ROS:   Please see the history of present illness.    Appetite is stable.  Compliance with medication regimen has verified.  All other systems reviewed and are negative.  EKGs/Labs/Other Studies Reviewed:    The following studies were reviewed today:  2 D ECHOCARDIOGRAM 2020: IMPRESSIONS  1. Left ventricular ejection fraction, by visual estimation, is 30 to  35%. The left ventricle has moderate to severely decreased function. Left  ventricular septal wall thickness was normal. There is moderately  increased left ventricular hypertrophy.  2. Moderately dilated left ventricular internal cavity size.  3. Global right ventricle has normal systolic function.The right  ventricular size is normal. No increase in right  ventricular wall  thickness.  4. Left atrial size was moderately dilated.  5. Right atrial size was normal.  6. Moderate calcification of the mitral valve leaflet(s).  7. Moderate mitral annular calcification.  8. Moderate thickening of the mitral valve leaflet(s).  9. The mitral valve is normal in structure. Mild mitral valve  regurgitation.  10. The tricuspid valve is normal in structure. Tricuspid valve  regurgitation is mild.  11. Aortic valve regurgitation is moderate.  12. The aortic valve is tricuspid. Aortic valve regurgitation is moderate.  Mild to moderate aortic valve sclerosis/calcification without any evidence  of aortic stenosis.  13.  The pulmonic valve was grossly normal. Pulmonic valve regurgitation is  mild.  14. Mildly elevated pulmonary artery systolic pressure.     EKG:  EKG not repeated  Recent Labs: 06/06/2020: ALT 7; Hemoglobin 13.7; Platelets 195.0; TSH 6.28 10/03/2020: BUN 17; Creatinine, Ser 0.97; Potassium 4.5; Sodium 143  Recent Lipid Panel    Component Value Date/Time   CHOL 194 07/06/2017 1300   TRIG 131.0 07/06/2017 1300   HDL 56.20 07/06/2017 1300   CHOLHDL 3 07/06/2017 1300   VLDL 26.2 07/06/2017 1300   LDLCALC 111 (H) 07/06/2017 1300    Physical Exam:    VS:  BP (!) 148/82   Pulse 60   Ht 5\' 7"  (1.702 m)   Wt 145 lb (65.8 kg)   BMI 22.71 kg/m     Wt Readings from Last 3 Encounters:  01/14/21 145 lb (65.8 kg)  09/15/20 153 lb 6.4 oz (69.6 kg)  08/13/20 152 lb 6.4 oz (69.1 kg)     GEN: Elderly without evidence of pallor.  Well-nourished appearing.. No acute distress HEENT: Normal NECK: No JVD. LYMPHATICS: No lymphadenopathy CARDIAC: No murmur. RRR no gallop, or edema. VASCULAR:  Normal Pulses. No bruits. RESPIRATORY:  Clear to auscultation without rales, wheezing or rhonchi  ABDOMEN: Soft, non-tender, non-distended, No pulsatile mass, MUSCULOSKELETAL: No deformity  SKIN: Warm and dry NEUROLOGIC:  Alert and oriented x  3 PSYCHIATRIC:  Normal affect   ASSESSMENT:    1. Chronic systolic heart failure ([place])   2. Essential hypertension   3. LBBB (left bundle branch block)   4. History of CVA (cerebrovascular accident)   5. Chronic diastolic HF (heart failure) (HCC)    PLAN:    In order of problems listed above:  1. Increase Farxiga to 10 mg daily.  Consider repeat echo sometime in 2023.  Basic metabolic panel in 2 weeks after change in therapy. 2. Blood pressure is adequate for age.  Target 140/80 mmHg. 3. Did not repeat ECG. 4. No neurological complaints 5. Volume status is stable.  Continue for total of therapy including Aldactone 12.5 mg Monday and Thursday, Accupril 10 mg/day, Toprol-XL 12.5 mg daily, and Farxiga 10 mg/day.  Guideline directed therapy for left ventricular systolic dysfunction: Angiotensin receptor-neprilysin inhibitor (ARNI)-Entresto; beta-blocker therapy - carvedilol, metoprolol succinate, or bisoprolol; mineralocorticoid receptor antagonist (MRA) therapy -spironolactone or eplerenone.  SGLT-2 agents -  Dapagliflozin Wilder Glade) or Empagliflozin ([place]iance).These therapies have been shown to improve clinical outcomes including reduction of rehospitalization, survival, and acute heart failure.   Medication Adjustments/Labs and Tests Ordered: Current medicines are reviewed at length with the patient today.  Concerns regarding medicines are outlined above.  Orders Placed This Encounter  Procedures  . Basic metabolic panel   Meds ordered this encounter  Medications  . dapagliflozin propanediol (FARXIGA) 10 MG TABS tablet    Sig: Take 1 tablet (10 mg total) by mouth daily before breakfast.    Dispense:  90 tablet    Refill:  3    Dose change    There are no Patient Instructions on file for this visit.   Signed, Sinclair Grooms, MD  01/14/2021 11:18 AM    [place]

## 2021-01-14 ENCOUNTER — Ambulatory Visit (INDEPENDENT_AMBULATORY_CARE_PROVIDER_SITE_OTHER): Payer: Medicare Other | Admitting: Interventional Cardiology

## 2021-01-14 ENCOUNTER — Encounter (INDEPENDENT_AMBULATORY_CARE_PROVIDER_SITE_OTHER): Payer: Self-pay

## 2021-01-14 ENCOUNTER — Other Ambulatory Visit: Payer: Self-pay

## 2021-01-14 ENCOUNTER — Encounter: Payer: Self-pay | Admitting: Interventional Cardiology

## 2021-01-14 VITALS — BP 148/82 | HR 60 | Ht 67.0 in | Wt 145.0 lb

## 2021-01-14 DIAGNOSIS — Z8673 Personal history of transient ischemic attack (TIA), and cerebral infarction without residual deficits: Secondary | ICD-10-CM

## 2021-01-14 DIAGNOSIS — I5022 Chronic systolic (congestive) heart failure: Secondary | ICD-10-CM

## 2021-01-14 DIAGNOSIS — I10 Essential (primary) hypertension: Secondary | ICD-10-CM | POA: Diagnosis not present

## 2021-01-14 DIAGNOSIS — I447 Left bundle-branch block, unspecified: Secondary | ICD-10-CM

## 2021-01-14 DIAGNOSIS — I5032 Chronic diastolic (congestive) heart failure: Secondary | ICD-10-CM | POA: Diagnosis not present

## 2021-01-14 MED ORDER — DAPAGLIFLOZIN PROPANEDIOL 10 MG PO TABS: 10.0000 mg | ORAL_TABLET | Freq: Every day | ORAL | 3 refills | Status: DC

## 2021-01-14 NOTE — Patient Instructions (Signed)
Medication Instructions:  1) INCREASE Farxiga to 10mg  once daily  *If you need a refill on your cardiac medications before your next appointment, please call your pharmacy*   Lab Work: BMET in 2 weeks  If you have labs (blood work) drawn today and your tests are completely normal, you will receive your results only by: Marland Kitchen MyChart Message (if you have MyChart) OR . A paper copy in the mail If you have any lab test that is abnormal or we need to change your treatment, we will call you to review the results.   Testing/Procedures: None   Follow-Up: At [facility], you and your health needs are our priority.  As part of our continuing mission to provide you with exceptional heart care, we have created designated Provider Care Teams.  These Care Teams include your primary Cardiologist (physician) and Advanced Practice Providers (APPs -  Physician Assistants and Nurse Practitioners) who all work together to provide you with the care you need, when you need it.  We recommend signing up for the patient portal called "MyChart".  Sign up information is provided on this After Visit Summary.  MyChart is used to connect with patients for Virtual Visits (Telemedicine).  Patients are able to view lab/test results, encounter notes, upcoming appointments, etc.  Non-urgent messages can be sent to your provider as well.   To learn more about what you can do with MyChart, go to [email or web address].    Your next appointment:   5-7 month(s)  The format for your next appointment:   In Person  Provider:   You may see Sinclair Grooms, MD or one of the following Advanced Practice Providers on your designated Care Team:    Kathyrn Drown, NP    Other Instructions

## 2021-01-30 ENCOUNTER — Other Ambulatory Visit: Payer: Medicare Other | Admitting: *Deleted

## 2021-01-30 ENCOUNTER — Other Ambulatory Visit: Payer: Self-pay

## 2021-01-30 DIAGNOSIS — I5022 Chronic systolic (congestive) heart failure: Secondary | ICD-10-CM | POA: Diagnosis not present

## 2021-01-31 LAB — BASIC METABOLIC PANEL
BUN/Creatinine Ratio: 22 (ref 12–28)
BUN: 18 mg/dL (ref 8–27)
CO2: 25 mmol/L (ref 20–29)
Calcium: 8.8 mg/dL (ref 8.7–10.3)
Chloride: 108 mmol/L — ABNORMAL HIGH (ref 96–106)
Creatinine, Ser: 0.82 mg/dL (ref 0.57–1.00)
Glucose: 83 mg/dL (ref 65–99)
Potassium: 5.2 mmol/L (ref 3.5–5.2)
Sodium: 143 mmol/L (ref 134–144)
eGFR: 69 mL/min/{1.73_m2} (ref >59)

## 2021-02-02 ENCOUNTER — Other Ambulatory Visit: Payer: Self-pay | Admitting: *Deleted

## 2021-02-10 ENCOUNTER — Other Ambulatory Visit: Payer: Self-pay | Admitting: Internal Medicine

## 2021-02-10 NOTE — Telephone Encounter (Signed)
Last filled 01-09-21 #30 Last OV 06-06-20 Next OV 06-10-21 [facility] [place]

## 2021-02-24 ENCOUNTER — Other Ambulatory Visit: Payer: Self-pay

## 2021-02-24 MED ORDER — DAPAGLIFLOZIN PROPANEDIOL 10 MG PO TABS: 10.0000 mg | ORAL_TABLET | Freq: Every day | ORAL | 3 refills | Status: DC

## 2021-02-27 ENCOUNTER — Telehealth: Payer: Self-pay | Admitting: Interventional Cardiology

## 2021-02-27 MED ORDER — DAPAGLIFLOZIN PROPANEDIOL 10 MG PO TABS: 10.0000 mg | ORAL_TABLET | Freq: Every day | ORAL | 3 refills | Status: DC

## 2021-02-27 NOTE — Telephone Encounter (Signed)
Pt c/o medication issue:  1. Name of Medication: dapagliflozin propanediol (FARXIGA) 10 MG TABS tablet  2. How are you currently taking this medication (dosage and times per day)?   3. Are you having a reaction (difficulty breathing--STAT)?   4. What is your medication issue? Pt is calling with concerns about this medication.the pharmacy called stating a mail order was received for her up there with this medication

## 2021-02-27 NOTE — Telephone Encounter (Signed)
Spoke with pt and she states that she received a call from [facility] that a prescription for Wilder Glade had been sent to them and the cost was going to be over $400.  She didn't understand why they received the prescription because [facility] [place] and has recently picked it up.  Pt no longer uses mail order pharmacy.  Advised I will send over a prescription to mail order telling them to cancel prescription because she gets it from a local pharmacy.  Pt appreciative for assistance.

## 2021-03-16 ENCOUNTER — Other Ambulatory Visit: Payer: Self-pay | Admitting: Internal Medicine

## 2021-03-16 NOTE — Telephone Encounter (Signed)
Last filled 02-11-21 #30 Last OV 07-09-20 Next OV 06-10-21 [facility] [place]

## 2021-04-07 DIAGNOSIS — H02831 Dermatochalasis of right upper eyelid: Secondary | ICD-10-CM | POA: Diagnosis not present

## 2021-04-07 DIAGNOSIS — H18413 Arcus senilis, bilateral: Secondary | ICD-10-CM | POA: Diagnosis not present

## 2021-04-07 DIAGNOSIS — Z961 Presence of intraocular lens: Secondary | ICD-10-CM | POA: Diagnosis not present

## 2021-04-07 DIAGNOSIS — H353132 Nonexudative age-related macular degeneration, bilateral, intermediate dry stage: Secondary | ICD-10-CM | POA: Diagnosis not present

## 2021-04-10 ENCOUNTER — Other Ambulatory Visit: Payer: Self-pay | Admitting: *Deleted

## 2021-04-10 MED ORDER — DAPAGLIFLOZIN PROPANEDIOL 10 MG PO TABS: 10.0000 mg | ORAL_TABLET | Freq: Every day | ORAL | 3 refills | Status: DC

## 2021-04-15 ENCOUNTER — Emergency Department (HOSPITAL_COMMUNITY): Payer: Medicare Other

## 2021-04-15 ENCOUNTER — Other Ambulatory Visit: Payer: Self-pay

## 2021-04-15 ENCOUNTER — Emergency Department (HOSPITAL_COMMUNITY)
Admission: EM | Admit: 2021-04-15 | Discharge: 2021-04-15 | Disposition: A | Discharge status: Home / Self Care | Payer: Medicare Other | Attending: Emergency Medicine | Admitting: Emergency Medicine

## 2021-04-15 ENCOUNTER — Telehealth: Payer: Self-pay | Admitting: Interventional Cardiology

## 2021-04-15 DIAGNOSIS — I2510 Atherosclerotic heart disease of native coronary artery without angina pectoris: Secondary | ICD-10-CM | POA: Insufficient documentation

## 2021-04-15 DIAGNOSIS — I5042 Chronic combined systolic (congestive) and diastolic (congestive) heart failure: Secondary | ICD-10-CM | POA: Insufficient documentation

## 2021-04-15 DIAGNOSIS — I700 Atherosclerosis of aorta: Secondary | ICD-10-CM | POA: Diagnosis not present

## 2021-04-15 DIAGNOSIS — Z79899 Other long term (current) drug therapy: Secondary | ICD-10-CM | POA: Diagnosis not present

## 2021-04-15 DIAGNOSIS — I130 Hypertensive heart and chronic kidney disease with heart failure and stage 1 through stage 4 chronic kidney disease, or unspecified chronic kidney disease: Secondary | ICD-10-CM | POA: Diagnosis not present

## 2021-04-15 DIAGNOSIS — Z20822 Contact with and (suspected) exposure to covid-19: Secondary | ICD-10-CM | POA: Diagnosis not present

## 2021-04-15 DIAGNOSIS — I10 Essential (primary) hypertension: Secondary | ICD-10-CM | POA: Diagnosis not present

## 2021-04-15 DIAGNOSIS — R0902 Hypoxemia: Secondary | ICD-10-CM | POA: Diagnosis not present

## 2021-04-15 DIAGNOSIS — E039 Hypothyroidism, unspecified: Secondary | ICD-10-CM | POA: Insufficient documentation

## 2021-04-15 DIAGNOSIS — N1832 Chronic kidney disease, stage 3b: Secondary | ICD-10-CM | POA: Insufficient documentation

## 2021-04-15 DIAGNOSIS — R079 Chest pain, unspecified: Secondary | ICD-10-CM | POA: Diagnosis not present

## 2021-04-15 DIAGNOSIS — I509 Heart failure, unspecified: Secondary | ICD-10-CM | POA: Diagnosis not present

## 2021-04-15 DIAGNOSIS — R0789 Other chest pain: Secondary | ICD-10-CM | POA: Diagnosis not present

## 2021-04-15 DIAGNOSIS — Z96653 Presence of artificial knee joint, bilateral: Secondary | ICD-10-CM | POA: Insufficient documentation

## 2021-04-15 DIAGNOSIS — Z7902 Long term (current) use of antithrombotics/antiplatelets: Secondary | ICD-10-CM | POA: Insufficient documentation

## 2021-04-15 LAB — CBC WITH DIFFERENTIAL/PLATELET
Abs Immature Granulocytes: 0.03 10*3/uL (ref 0.00–0.07)
Basophils Absolute: 0.1 10*3/uL (ref 0.0–0.1)
Basophils Relative: 1 %
Eosinophils Absolute: 0.1 10*3/uL (ref 0.0–0.5)
Eosinophils Relative: 2 %
HCT: 42.2 % (ref 36.0–46.0)
Hemoglobin: 13.1 g/dL (ref 12.0–15.0)
Immature Granulocytes: 1 %
Lymphocytes Relative: 20 %
Lymphs Abs: 1.1 10*3/uL (ref 0.7–4.0)
MCH: 29.6 pg (ref 26.0–34.0)
MCHC: 31 g/dL (ref 30.0–36.0)
MCV: 95.5 fL (ref 80.0–100.0)
Monocytes Absolute: 0.5 10*3/uL (ref 0.1–1.0)
Monocytes Relative: 10 %
Neutro Abs: 3.6 10*3/uL (ref 1.7–7.7)
Neutrophils Relative %: 66 %
Platelets: 173 10*3/uL (ref 150–400)
RBC: 4.42 MIL/uL (ref 3.87–5.11)
RDW: 14.3 % (ref 11.5–15.5)
WBC: 5.4 10*3/uL (ref 4.0–10.5)
nRBC: 0 % (ref 0.0–0.2)

## 2021-04-15 LAB — COMPREHENSIVE METABOLIC PANEL
ALT: 10 U/L (ref 0–44)
AST: 16 U/L (ref 15–41)
Albumin: 3.3 g/dL — ABNORMAL LOW (ref 3.5–5.0)
Alkaline Phosphatase: 39 U/L (ref 38–126)
Anion gap: 7 (ref 5–15)
BUN: 19 mg/dL (ref 8–23)
CO2: 23 mmol/L (ref 22–32)
Calcium: 8.7 mg/dL — ABNORMAL LOW (ref 8.9–10.3)
Chloride: 108 mmol/L (ref 98–111)
Creatinine, Ser: 1.01 mg/dL — ABNORMAL HIGH (ref 0.44–1.00)
GFR, Estimated: 54 mL/min — ABNORMAL LOW (ref >60)
Glucose, Bld: 91 mg/dL (ref 70–99)
Potassium: 4.3 mmol/L (ref 3.5–5.1)
Sodium: 138 mmol/L (ref 135–145)
Total Bilirubin: 0.8 mg/dL (ref 0.3–1.2)
Total Protein: 6.1 g/dL — ABNORMAL LOW (ref 6.5–8.1)

## 2021-04-15 LAB — RESP PANEL BY RT-PCR (FLU A&B, COVID) ARPGX2
Influenza A by PCR: NEGATIVE
Influenza B by PCR: NEGATIVE
SARS Coronavirus 2 by RT PCR: NEGATIVE

## 2021-04-15 LAB — TROPONIN I (HIGH SENSITIVITY)
Troponin I (High Sensitivity): 8 ng/L (ref <18)
Troponin I (High Sensitivity): 9 ng/L (ref <18)

## 2021-04-15 LAB — BRAIN NATRIURETIC PEPTIDE: B Natriuretic Peptide: 159.3 pg/mL — ABNORMAL HIGH (ref 0.0–100.0)

## 2021-04-15 NOTE — ED Provider Notes (Signed)
[facility] EMERGENCY DEPARTMENT Provider Note   CSN: [id number] Arrival date & time: 04/15/21  [id number]     History No chief complaint on file.   Leslie [name] is a 87 y.o. female.  Pt complains of chest pain that started at 6am this morning.  Pt has had increased activity recently.  Pt has had a history of cardiomyopathy and congestive heart failure.  Pt was given asa and nitro by EMS  PT reports pain improved after getting off bumpy ambulance.  Pt has been doing yard work and mowing.  Pt reports she was clearing a weeded area yesterday.  Pt reports she had pain this am.    The history is provided by the patient. No language interpreter was used.  Chest Pain Pain location:  L chest Pain quality: aching   Pain severity:  Moderate Onset quality:  Sudden Duration:  1 day Chronicity:  New Relieved by:  Aspirin and nitroglycerin Worsened by:  Nothing Ineffective treatments:  None tried Risk factors: high cholesterol and hypertension       Past Medical History:  Diagnosis Date   Anemia    Anxiety    Arthritis    Bundle branch block left    CHF (congestive heart failure) (HCC)    chronic combine CHF   Complication of anesthesia    " stomach did not wake up after back surgery "    Corneal burn    left eye-is almost healed"Cascade pod"   Depression    GERD (gastroesophageal reflux disease)    tums   Headache    hx of migraines    Heart failure, left-sided (HCC)    Hypertension    Hypothyroidism    MI (myocardial infarction) ([place])    Nonischemic cardiomyopathy ([place])    '01 EF 30% with normal coronaries; EF 45-50% 03/2012   Shortness of breath    hx of nothing current on 01/17/2015    Stroke [facility])    blurred vision residual    Patient Active Problem List   Diagnosis Date Noted   Leg ulcer ([place]) 07/09/2020   Hypotension 12/19/2019   Acquired thrombophilia ([place]) 06/05/2019   Right-sided back pain 04/07/2018   Preventative health care 01/13/2018    Carotid artery disease ([place]) 01/13/2018   Stage 3b chronic kidney disease ([place]) 01/13/2018   Advance directive discussed with patient 01/13/2018   Varicella zoster 12/30/2017   GERD (gastroesophageal reflux disease) 07/06/2017   Mood disorder ([place]) 07/06/2017   Vitamin B12 deficiency 07/06/2017   History of CVA (cerebrovascular accident) 07/06/2017   LBBB (left bundle branch block) 06/16/2014   Chronic combined systolic and diastolic heart failure ([place]) 06/16/2014   Hypothyroidism 05/25/2012   HTN (hypertension) 05/25/2012    Past Surgical History:  Procedure Laterality Date   CARDIAC CATHETERIZATION     13 yrs. ago   CATARACT EXTRACTION, BILATERAL Bilateral    detached retinia     DILATION AND CURETTAGE OF UTERUS     EYE SURGERY     bilateral cataracts   HERNIA REPAIR     hiatial   hiatal hernia surgery      LUMBAR LAMINECTOMY/DECOMPRESSION MICRODISCECTOMY  05/17/2012   Procedure: LUMBAR LAMINECTOMY/DECOMPRESSION MICRODISCECTOMY 2 LEVELS;  Surgeon: Ophelia Charter, MD;  Location: [facility];  Service: Neurosurgery;  Laterality: N/A;  Lumbar four laminectomy with bilateral Lumbar three laminotomies and removal of synovial cyst   TONSILLECTOMY     TOTAL KNEE ARTHROPLASTY Left 01/21/2015   Procedure: LEFT  TOTAL KNEE ARTHROPLASTY;  Surgeon: Paralee Cancel, MD;  Location: [facility];  Service: Orthopedics;  Laterality: Left;   TOTAL KNEE ARTHROPLASTY Right 07/22/2015   Procedure: RIGHT TOTAL KNEE ARTHROPLASTY;  Surgeon: Paralee Cancel, MD;  Location: [facility];  Service: Orthopedics;  Laterality: Right;     OB History   No obstetric history on file.     Family History  Problem Relation Age of Onset   CAD Mother    Dementia Father    Hypertension Sister    Diabetes Sister    Cancer Sister     Social History   Tobacco Use   Smoking status: Never   Smokeless tobacco: Never  Vaping Use   Vaping Use: Never used  Substance Use Topics   Alcohol use: No   Drug use: No    Home  Medications Prior to Admission medications   Medication Sig Start Date End Date Taking? Authorizing Provider  BD INTEGRA SYRINGE 25G X 1" 3 ML MISC USE 1 EVERY 30 (THIRTY) DAYS. WITH VITAMINE B 12 INJECTION 10/15/19   [name], MD  Calcium Carbonate-Vit D-Min (CALCIUM 1200 PO) Take 1 tablet by mouth daily.    [provider]  citalopram (CELEXA) 20 MG tablet TAKE 1 TABLET BY MOUTH EVERY DAY 09/16/20   Viviana Simpler I, MD  clopidogrel (PLAVIX) 75 MG tablet TAKE 1 TABLET BY MOUTH EVERY DAY 10/20/20   Viviana Simpler I, MD  cyanocobalamin (,VITAMIN B-12,) 1000 MCG/ML injection INJECT 1 ML (1,000 MCG TOTAL) INTO THE MUSCLE EVERY 30 (THIRTY) DAYS. 09/29/20   [name], MD  dapagliflozin propanediol (FARXIGA) 10 MG TABS tablet Take 1 tablet (10 mg total) by mouth daily before breakfast. 04/10/21   [name], MD  levothyroxine (SYNTHROID) 75 MCG tablet TAKE 1 TABLET BY MOUTH EVERY DAY 08/20/20   [name], MD  LORazepam (ATIVAN) 1 MG tablet TAKE 1/2 TO 1 TABLET BY MOUTH AT BEDTIME AS NEEDED 03/16/21   [name], MD  metoprolol succinate (TOPROL XL) 25 MG 24 hr tablet Take 0.5 tablets (12.5 mg total) by mouth daily. 09/15/20   [name], MD  NEEDLE, DISP, 25 G (B-D DISP NEEDLE 25GX1") 25G X 1" MISC 1 each by Does not apply route every 30 (thirty) days. 10/31/18   [name], MD  quinapril (ACCUPRIL) 10 MG tablet TAKE 1 TABLET BY MOUTH EVERY DAY 11/10/20   [name], MD    Allergies    Sulfa antibiotics, Cephalexin, Dilaudid [hydromorphone hcl], and Xopenex [levalbuterol hcl]  Review of Systems   Review of Systems  Cardiovascular:  Positive for chest pain.  All other systems reviewed and are negative.  Physical Exam Updated Vital Signs BP 127/67   Pulse 66   Temp 98.4 F (36.9 C)   Resp 16   SpO2 97%   Physical Exam Vitals and nursing note reviewed.  Constitutional:      Appearance: She is well-developed.  HENT:     Head:  Normocephalic.     Nose: Nose normal.     Mouth/Throat:     Mouth: Mucous membranes are moist.  Cardiovascular:     Rate and Rhythm: Normal rate.  Pulmonary:     Effort: Pulmonary effort is normal.  Abdominal:     General: There is no distension.  Musculoskeletal:        General: Normal range of motion.     Cervical back: Normal range of motion.  Neurological:  General: No focal deficit present.     Mental Status: She is alert and oriented to person, place, and time.  Psychiatric:        Mood and Affect: Mood normal.    ED Results / Procedures / Treatments   Labs (all labs ordered are listed, but only abnormal results are displayed) Labs Reviewed  COMPREHENSIVE METABOLIC PANEL - Abnormal; Notable for the following components:      Result Value   Creatinine, Ser 1.01 (*)    Calcium 8.7 (*)    Total Protein 6.1 (*)    Albumin 3.3 (*)    GFR, Estimated 54 (*)    All other components within normal limits  BRAIN NATRIURETIC PEPTIDE - Abnormal; Notable for the following components:   B Natriuretic Peptide 159.3 (*)    All other components within normal limits  RESP PANEL BY RT-PCR (FLU A&B, COVID) ARPGX2  CBC WITH DIFFERENTIAL/PLATELET  TROPONIN I (HIGH SENSITIVITY)  TROPONIN I (HIGH SENSITIVITY)    EKG EKG Interpretation  Date/Time:  Wednesday April 15 2021 [id number] EDT Ventricular Rate:  65 PR Interval:  160 QRS Duration: 152 QT Interval:  462 QTC Calculation: 480 R Axis:   182 Text Interpretation: Normal sinus rhythm Right superior axis deviation Non-specific intra-ventricular conduction block No significant change since last tracing ST-t wave abnormality Abnormal ECG Confirmed by Carmin Muskrat [phone]) on 04/15/2021 9:40:50 AM  Radiology No results found.  Procedures Procedures   Medications Ordered in ED Medications - No data to display  ED Course  I have reviewed the triage vital signs and the nursing notes.  Pertinent labs & imaging results that  were available during my care of the patient were reviewed by me and considered in my medical decision making (see chart for details).    MDM Rules/Calculators/A&P                          MDM: EKG, chest xray and labs ordered.  Labs reviewed,  troponin is negative x 2.  Chest xray is normal  Pt counseled on symptoms   Final Clinical Impression(s) / ED Diagnoses Final diagnoses:  Chest wall pain    Rx / DC Orders ED Discharge Orders     None     An After Visit Summary was printed and given to the patient.    Fransico Meadow, PA-C 04/15/21 1800    Carmin Muskrat, MD 04/16/21 [phone]

## 2021-04-15 NOTE — ED Triage Notes (Signed)
Pt here from home with c/o left sided chest pain that hurts worse with movement and palpation pt received '324mg'$  asa and 2 nitro by ems

## 2021-04-15 NOTE — Telephone Encounter (Signed)
Called son Leslie [name] ok per DPR.  I informed him that the hospital is in control of pt care.  We can not direct care at this time.  He expresses that he understands and doesn't know why Nevin Bloodgood (friend) called our office.  No further questions or concerns at this time.

## 2021-04-15 NOTE — ED Provider Notes (Signed)
Emergency Medicine Provider Triage Evaluation Note  SCHERRI MESSERSMITH , a 87 y.o. female  was evaluated in triage.  Pt complains of chest pain   Review of Systems  Positive: Pain relieved with nitro  Negative: cough  Physical Exam  There were no vitals taken for this visit. Gen:   Awake, no distress   Resp:  Normal effort  MSK:   Moves extremities without difficulty  Other:    Medical Decision Making  Medically screening exam initiated at 9:30 AM.  Appropriate orders placed.  [name] was informed that the remainder of the evaluation will be completed by another provider, this initial triage assessment does not replace that evaluation, and the importance of remaining in the ED until their evaluation is complete.     Fransico Meadow, [place] 04/15/21 [id number]    Carmin Muskrat, MD 04/15/21 [phone]

## 2021-04-15 NOTE — Telephone Encounter (Signed)
Pt is currently at [facility] with Chest Pain sitting in the waiting room for 3 hours, she have only had Lab work but was not questioned about the Chest Pain. Pt is just wanting to make Dr. Tamala Julian aware and to also wants  to know if Dr Tamala Julian can view the lab work and call her with results. Pt is accompanied by her Tanna Savoy [phone] (Not on DPR)   Jamani Crane [facility] is on [facility] [phone]

## 2021-04-15 NOTE — Discharge Instructions (Addendum)
Tylenol for pain.  Return if any problems.

## 2021-04-16 ENCOUNTER — Telehealth: Payer: Self-pay

## 2021-04-16 ENCOUNTER — Other Ambulatory Visit: Payer: Self-pay | Admitting: Internal Medicine

## 2021-04-16 NOTE — Telephone Encounter (Signed)
Spoke to pt. She said she is doing a little better but would like to see Dr Silvio Pate next week. Made her an appt for 9-12. 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

## 2021-04-17 NOTE — Telephone Encounter (Signed)
Last filled 03-16-21 #30 Last OV 07-09-20 Next OV 04-20-21 [facility]

## 2021-04-20 ENCOUNTER — Ambulatory Visit (INDEPENDENT_AMBULATORY_CARE_PROVIDER_SITE_OTHER): Payer: Medicare Other | Admitting: Internal Medicine

## 2021-04-20 ENCOUNTER — Encounter: Payer: Self-pay | Admitting: Internal Medicine

## 2021-04-20 ENCOUNTER — Other Ambulatory Visit: Payer: Self-pay

## 2021-04-20 VITALS — BP 140/88 | HR 60 | Temp 97.5°F | Ht 67.0 in | Wt 148.0 lb

## 2021-04-20 DIAGNOSIS — F39 Unspecified mood [affective] disorder: Secondary | ICD-10-CM

## 2021-04-20 DIAGNOSIS — Z23 Encounter for immunization: Secondary | ICD-10-CM

## 2021-04-20 DIAGNOSIS — I5042 Chronic combined systolic (congestive) and diastolic (congestive) heart failure: Secondary | ICD-10-CM | POA: Diagnosis not present

## 2021-04-20 DIAGNOSIS — R079 Chest pain, unspecified: Secondary | ICD-10-CM | POA: Insufficient documentation

## 2021-04-20 DIAGNOSIS — R0789 Other chest pain: Secondary | ICD-10-CM | POA: Diagnosis not present

## 2021-04-20 NOTE — Assessment & Plan Note (Signed)
Compensated Now on farxiga as well

## 2021-04-20 NOTE — Progress Notes (Signed)
Subjective:    Patient ID: Leslie [name], female    DOB: 09-15-1930, 87 y.o.   MRN: [id number]  HPI Here with me for ER follow up This visit occurred during the SARS-CoV-2 public health emergency.  Safety protocols were in place, including screening questions prior to the visit, additional usage of staff PPE, and extensive cleaning of exam room while observing appropriate contact time as indicated for disinfecting solutions.   Woke with pain at about 6:30AM--9/7 Waited an hour--didn't get better No improvement---so called EMS about an hour later Points to below the left midclavicular area Got nitro and ASA in ambulance--no help Feels SOB in general--with activity---but not with the pain No nausea or diaphorese  Did mow some weeds--had to turn in tight circles (may have caused soreness) That was 2-3 days before  It did ease off in the ER Then noticed that the pain was dependent on how she moved her left arm  Since then, it is better Not feeling it much now Has been taking it easy since then  Has used salon pas with relief--and taking tylenol  Current Outpatient Medications on File Prior to Visit  Medication Sig Dispense Refill   BD INTEGRA SYRINGE 25G X 1" 3 ML MISC USE 1 EVERY 30 (THIRTY) DAYS. WITH VITAMINE B 12 INJECTION 3 each 3   Calcium Carbonate-Vit D-Min (CALCIUM 1200 PO) Take 1 tablet by mouth daily.     citalopram (CELEXA) 20 MG tablet TAKE 1 TABLET BY MOUTH EVERY DAY (Patient taking differently: Take 20 mg by mouth daily.) 90 tablet 3   clopidogrel (PLAVIX) 75 MG tablet TAKE 1 TABLET BY MOUTH EVERY DAY (Patient taking differently: Take 75 mg by mouth daily.) 90 tablet 3   cyanocobalamin (,VITAMIN B-12,) 1000 MCG/ML injection INJECT 1 ML (1,000 MCG TOTAL) INTO THE MUSCLE EVERY 30 (THIRTY) DAYS. 3 mL 3   dapagliflozin propanediol (FARXIGA) 10 MG TABS tablet Take 1 tablet (10 mg total) by mouth daily before breakfast. 90 tablet 3   levothyroxine (SYNTHROID) 75 MCG tablet  TAKE 1 TABLET BY MOUTH EVERY DAY (Patient taking differently: Take 75 mcg by mouth daily.) 90 tablet 3   LORazepam (ATIVAN) 1 MG tablet TAKE 1/2 TO 1 TABLET BY MOUTH AT BEDTIME AS NEEDED 30 tablet 0   metoprolol succinate (TOPROL XL) 25 MG 24 hr tablet Take 0.5 tablets (12.5 mg total) by mouth daily. 45 tablet 3   NEEDLE, DISP, 25 G (B-D DISP NEEDLE 25GX1") 25G X 1" MISC 1 each by Does not apply route every 30 (thirty) days. 1 each 11   Polyvinyl Alcohol-Povidone (REFRESH OP) Place 1 drop into both eyes in the morning, at noon, in the evening, and at bedtime.     quinapril (ACCUPRIL) 10 MG tablet TAKE 1 TABLET BY MOUTH EVERY DAY (Patient taking differently: Take 10 mg by mouth daily.) 90 tablet 3   No current facility-administered medications on file prior to visit.    Allergies  Allergen Reactions   Sulfa Antibiotics Other (See Comments)    "urine crystallizes"   Cephalexin Diarrhea   Dilaudid [Hydromorphone Hcl]         Xopenex [Levalbuterol Hcl] Other (See Comments)    "gives me the shakes"    Past Medical History:  Diagnosis Date   Anemia    Anxiety    Arthritis    Bundle branch block left    CHF (congestive heart failure) (HCC)    chronic combine CHF   Complication of anesthesia    "  stomach did not wake up after back surgery "    Corneal burn    left eye-is almost healed"Cascade pod"   Depression    GERD (gastroesophageal reflux disease)    tums   Headache    hx of migraines    Heart failure, left-sided (HCC)    Hypertension    Hypothyroidism    MI (myocardial infarction) ([place])    Nonischemic cardiomyopathy ([place])    '01 EF 30% with normal coronaries; EF 45-50% 03/2012   Shortness of breath    hx of nothing current on 01/17/2015    Stroke [facility])    blurred vision residual    Past Surgical History:  Procedure Laterality Date   CARDIAC CATHETERIZATION     13 yrs. ago   CATARACT EXTRACTION, BILATERAL Bilateral    detached retinia     DILATION AND CURETTAGE OF  UTERUS     EYE SURGERY     bilateral cataracts   HERNIA REPAIR     hiatial   hiatal hernia surgery      LUMBAR LAMINECTOMY/DECOMPRESSION MICRODISCECTOMY  05/17/2012   Procedure: LUMBAR LAMINECTOMY/DECOMPRESSION MICRODISCECTOMY 2 LEVELS;  Surgeon: Ophelia Charter, MD;  Location: [facility];  Service: Neurosurgery;  Laterality: N/A;  Lumbar four laminectomy with bilateral Lumbar three laminotomies and removal of synovial cyst   TONSILLECTOMY     TOTAL KNEE ARTHROPLASTY Left 01/21/2015   Procedure: LEFT TOTAL KNEE ARTHROPLASTY;  Surgeon: Paralee Cancel, MD;  Location: [facility];  Service: Orthopedics;  Laterality: Left;   TOTAL KNEE ARTHROPLASTY Right 07/22/2015   Procedure: RIGHT TOTAL KNEE ARTHROPLASTY;  Surgeon: Paralee Cancel, MD;  Location: [facility];  Service: Orthopedics;  Laterality: Right;    Family History  Problem Relation Age of Onset   CAD Mother    Dementia Father    Hypertension Sister    Diabetes Sister    Cancer Sister     Social History   Socioeconomic History   Marital status: Widowed    Spouse name: Not on file   Number of children: 1   Years of education: Not on file   Highest education level: Not on file  Occupational History   Occupation: Regulatory affairs officer    Comment: retired  Tobacco Use   Smoking status: Never   Smokeless tobacco: Never  Vaping Use   Vaping Use: Never used  Substance and Sexual Activity   Alcohol use: No   Drug use: No   Sexual activity: Never  Other Topics Concern   Not on file  Social History Narrative   Widowed 2015   1 adopted son      Has living will   Son, then nephew [name], are health care POAs   Not sure about DNR--probably would accept resuscitation for now   No machine or tube feeds if cognitively unaware   Social Determinants of Health   Financial Resource Strain: Not on file  Food Insecurity: Not on file  Transportation Needs: Not on file  Physical Activity: Not on file  Stress: Not on file  Social  Connections: Not on file  Intimate Partner Violence: Not on file   Review of Systems Eating okay Generally sleeping okay--with the lorazepam Had farxiga started by cardiologist     Objective:   Physical Exam Constitutional:      Appearance: Normal appearance.  Cardiovascular:     Rate and Rhythm: Normal rate and regular rhythm.     Heart sounds: No murmur heard.   No gallop.  Pulmonary:     Effort: Pulmonary effort is normal.     Breath sounds: Normal breath sounds. No wheezing or rales.     Comments: Some tenderness under left mid clavicle Musculoskeletal:     Cervical back: Neck supple.  Lymphadenopathy:     Cervical: No cervical adenopathy.  Skin:    Comments: Some crepitus in left shoulder---and recreates some of her pain  Neurological:     Mental Status: She is alert.           Assessment & Plan:

## 2021-04-20 NOTE — Assessment & Plan Note (Signed)
Reviewed her history and ER records Does sound like chest wall pain No action for now

## 2021-04-20 NOTE — Assessment & Plan Note (Signed)
Has been doing okay Still uses the lorazepam for sleep

## 2021-05-19 ENCOUNTER — Telehealth: Payer: Self-pay | Admitting: Interventional Cardiology

## 2021-05-19 ENCOUNTER — Other Ambulatory Visit: Payer: Self-pay | Admitting: Internal Medicine

## 2021-05-19 NOTE — Telephone Encounter (Signed)
No samples were given to the pt yesterday. Spoke with [place] and she will place two boxes at the front for the pt.

## 2021-05-19 NOTE — Telephone Encounter (Signed)
Hey Lynn, LPN, can you please advise on this matter? Thanks  ?

## 2021-05-19 NOTE — Telephone Encounter (Signed)
**Note De-identified Annel Zunker Obfuscation** Ok, Thank you! 

## 2021-05-19 NOTE — Telephone Encounter (Signed)
Patient calling the office for samples of medication:   1.  What medication and dosage are you requesting samples for? dapagliflozin propanediol (FARXIGA) 10 MG TABS tablet  2.  Are you currently out of this medication? Has about a week supply left.  She is now at the point were she will have to pay 3x's amount she normally pays.

## 2021-05-19 NOTE — Telephone Encounter (Signed)
**Note De-Identified Dre Gamino Obfuscation** The pt states that her [place] has gone up in price from $50 to $150 and she cannot afford that amount.  She and I discussed pt asst for Farxiga through [facility] and [place] and she is aware that if she is approved she will receive her Wilder Glade from them free of charge.  I gave her their phone number and advised her to call them and apply for Farxiga assistance over the phone and that if they deny her to call me back.  She is also aware that we are leaving her 2 weeks of Farxiga 10 mg samples in the front office at Dr [employer] office at [facility] [address]	[place] in [place] for her to pick up.

## 2021-05-19 NOTE — Telephone Encounter (Signed)
Last filled 03-16-21 #30 Last OV 04-20-21 Next OV 06-10-21 [facility]

## 2021-05-19 NOTE — Telephone Encounter (Signed)
Hey lynn, LPN, looks like someone got her samples yesterday, 2 boxes.

## 2021-06-10 ENCOUNTER — Other Ambulatory Visit: Payer: Self-pay

## 2021-06-10 ENCOUNTER — Ambulatory Visit (INDEPENDENT_AMBULATORY_CARE_PROVIDER_SITE_OTHER): Payer: Medicare Other | Admitting: Internal Medicine

## 2021-06-10 ENCOUNTER — Encounter: Payer: Self-pay | Admitting: Internal Medicine

## 2021-06-10 VITALS — BP 134/86 | HR 60 | Temp 97.6°F | Ht 66.0 in | Wt 150.0 lb

## 2021-06-10 DIAGNOSIS — Z0000 Encounter for general adult medical examination without abnormal findings: Secondary | ICD-10-CM | POA: Diagnosis not present

## 2021-06-10 DIAGNOSIS — F39 Unspecified mood [affective] disorder: Secondary | ICD-10-CM | POA: Diagnosis not present

## 2021-06-10 DIAGNOSIS — I5042 Chronic combined systolic (congestive) and diastolic (congestive) heart failure: Secondary | ICD-10-CM | POA: Diagnosis not present

## 2021-06-10 DIAGNOSIS — I6523 Occlusion and stenosis of bilateral carotid arteries: Secondary | ICD-10-CM | POA: Diagnosis not present

## 2021-06-10 DIAGNOSIS — N1832 Chronic kidney disease, stage 3b: Secondary | ICD-10-CM | POA: Diagnosis not present

## 2021-06-10 DIAGNOSIS — D6869 Other thrombophilia: Secondary | ICD-10-CM

## 2021-06-10 NOTE — Assessment & Plan Note (Signed)
Compensated with the quinapril and metoprolol On plavix

## 2021-06-10 NOTE — Assessment & Plan Note (Signed)
Easy bruising without pathologic bleeding On plavix

## 2021-06-10 NOTE — Assessment & Plan Note (Signed)
I have personally reviewed the Medicare Annual Wellness questionnaire and have noted 1. The patient's medical and social history 2. Their use of alcohol, tobacco or illicit drugs 3. Their current medications and supplements 4. The patient's functional ability including ADL's, fall risks, home safety risks and hearing or visual             impairment. 5. Diet and physical activities 6. Evidence for depression or mood disorders  The patients weight, height, BMI and visual acuity have been recorded in the chart I have made referrals, counseling and provided education to the patient based review of the above and I have provided the pt with a written personalized care plan for preventive services.  I have provided you with a copy of your personalized plan for preventive services. Please take the time to review along with your updated medication list.  No cancer screening due to age 87 active around her house Had flu vaccine Needs bivalent COVID

## 2021-06-10 NOTE — Assessment & Plan Note (Signed)
Last GFR improved Is on ACEI

## 2021-06-10 NOTE — Assessment & Plan Note (Signed)
No symptoms 

## 2021-06-10 NOTE — Progress Notes (Signed)
Hearing Screening - Comments:: Has hearing aids. Wearing them today Vision Screening - Comments:: June 2022

## 2021-06-10 NOTE — Assessment & Plan Note (Signed)
Intermittent depression even on the citalopram---will continue Lorazepam to help sleep

## 2021-06-10 NOTE — Progress Notes (Signed)
Subjective:    Patient ID: Leslie [name], female    DOB: [birth year], 87 y.o.   MRN: [id number]  HPI Here for Medicare wellness visit and follow up of chronic health conditions This visit occurred during the SARS-CoV-2 public health emergency.  Safety protocols were in place, including screening questions prior to the visit, additional usage of staff PPE, and extensive cleaning of exam room while observing appropriate contact time as indicated for disinfecting solutions.   Reviewed advanced directives Reviewed other doctors---Dr Smith--cardiology, Dr Patel/Bevis--ophthal, Dr Jeralene Huff No hospitalizations or surgery in the past year Vision is not good---needs appt for worsening vision Hearing aides help some Stays active around house--no set exercise No alcohol or tobacco Has stumbled but no true falls. No injuries Independent with instrumental ADLs Only recall issues---names/words. 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 No PND No chest pain No palpitations  Still bruises very easy Seems not to even need to touch anything No abnormal bleeding  Mood is down at times--may last a couple of days Gets lonesome  Wonders about dying---worried she "won't get done everything she wants to do for people" Sleeps okay only if takes lorazepam  Last GFR 54  Current Outpatient Medications on File Prior to Visit  Medication Sig Dispense Refill   BD INTEGRA SYRINGE 25G X 1" 3 ML MISC USE 1 EVERY 30 (THIRTY) DAYS. WITH VITAMINE B 12 INJECTION 3 each 3   Calcium Carbonate-Vit D-Min (CALCIUM 1200 PO) Take 1 tablet by mouth daily.     citalopram (CELEXA) 20 MG tablet TAKE 1 TABLET BY MOUTH EVERY DAY (Patient taking differently: Take 20 mg by mouth daily.) 90 tablet 3   clopidogrel (PLAVIX) 75 MG tablet TAKE 1 TABLET BY MOUTH EVERY DAY (Patient taking  differently: Take 75 mg by mouth daily.) 90 tablet 3   cyanocobalamin (,VITAMIN B-12,) 1000 MCG/ML injection INJECT 1 ML (1,000 MCG TOTAL) INTO THE MUSCLE EVERY 30 (THIRTY) DAYS. 3 mL 3   dapagliflozin propanediol (FARXIGA) 10 MG TABS tablet Take 1 tablet (10 mg total) by mouth daily before breakfast. 90 tablet 3   levothyroxine (SYNTHROID) 75 MCG tablet TAKE 1 TABLET BY MOUTH EVERY DAY (Patient taking differently: Take 75 mcg by mouth daily.) 90 tablet 3   LORazepam (ATIVAN) 1 MG tablet TAKE 1/2 TO 1 TABLET BY MOUTH AT BEDTIME AS NEEDED 30 tablet 0   metoprolol succinate (TOPROL XL) 25 MG 24 hr tablet Take 0.5 tablets (12.5 mg total) by mouth daily. 45 tablet 3   NEEDLE, DISP, 25 G (B-D DISP NEEDLE 25GX1") 25G X 1" MISC 1 each by Does not apply route every 30 (thirty) days. 1 each 11   Polyvinyl Alcohol-Povidone (REFRESH OP) Place 1 drop into both eyes in the morning, at noon, in the evening, and at bedtime.     quinapril (ACCUPRIL) 10 MG tablet TAKE 1 TABLET BY MOUTH EVERY DAY (Patient taking differently: Take 10 mg by mouth daily.) 90 tablet 3   No current facility-administered medications on file prior to visit.    Allergies  Allergen Reactions   Sulfa Antibiotics Other (See Comments)    "urine crystallizes"   Cephalexin Diarrhea   Dilaudid [Hydromorphone Hcl]         Xopenex [Levalbuterol Hcl] Other (See Comments)    "gives me the shakes"    Past Medical History:  Diagnosis Date  Anemia    Anxiety    Arthritis    Bundle branch block left    CHF (congestive heart failure) (HCC)    chronic combine CHF   Complication of anesthesia    " stomach did not wake up after back surgery "    Corneal burn    left eye-is almost healed"Cascade pod"   Depression    GERD (gastroesophageal reflux disease)    tums   Headache    hx of migraines    Heart failure, left-sided (HCC)    Hypertension    Hypothyroidism    MI (myocardial infarction) ([place])    Nonischemic cardiomyopathy ([place])     '01 EF 30% with normal coronaries; EF 45-50% 03/2012   Shortness of breath    hx of nothing current on 01/17/2015    Stroke [facility])    blurred vision residual    Past Surgical History:  Procedure Laterality Date   CARDIAC CATHETERIZATION     13 yrs. ago   CATARACT EXTRACTION, BILATERAL Bilateral    detached retinia     DILATION AND CURETTAGE OF UTERUS     EYE SURGERY     bilateral cataracts   HERNIA REPAIR     hiatial   hiatal hernia surgery      LUMBAR LAMINECTOMY/DECOMPRESSION MICRODISCECTOMY  05/17/2012   Procedure: LUMBAR LAMINECTOMY/DECOMPRESSION MICRODISCECTOMY 2 LEVELS;  Surgeon: Ophelia Charter, MD;  Location: [place] [facility];  Service: Neurosurgery;  Laterality: N/A;  Lumbar four laminectomy with bilateral Lumbar three laminotomies and removal of synovial cyst   TONSILLECTOMY     TOTAL KNEE ARTHROPLASTY Left 01/21/2015   Procedure: LEFT TOTAL KNEE ARTHROPLASTY;  Surgeon: Paralee Cancel, MD;  Location: [facility];  Service: Orthopedics;  Laterality: Left;   TOTAL KNEE ARTHROPLASTY Right 07/22/2015   Procedure: RIGHT TOTAL KNEE ARTHROPLASTY;  Surgeon: Paralee Cancel, MD;  Location: [facility];  Service: Orthopedics;  Laterality: Right;    Family History  Problem Relation Age of Onset   CAD Mother    Dementia Father    Hypertension Sister    Diabetes Sister    Cancer Sister     Social History   Socioeconomic History   Marital status: Widowed    Spouse name: Not on file   Number of children: 1   Years of education: Not on file   Highest education level: Not on file  Occupational History   Occupation: Regulatory affairs officer    Comment: retired  Tobacco Use   Smoking status: Never   Smokeless tobacco: Never  Vaping Use   Vaping Use: Never used  Substance and Sexual Activity   Alcohol use: No   Drug use: No   Sexual activity: Never  Other Topics Concern   Not on file  Social History Narrative   Widowed 2015   1 adopted son      Has living will   Son, then nephew [name],  are health care POAs   Not sure about DNR--probably would accept resuscitation for now   No machine or tube feeds if cognitively unaware   Social Determinants of Health   Financial Resource Strain: Not on file  Food Insecurity: Not on file  Transportation Needs: Not on file  Physical Activity: Not on file  Stress: Not on file  Social Connections: Not on file  Intimate Partner Violence: Not on file   Review of Systems Appetite is okay Weight stable Wears seat belt Teeth okay---not regular with dentist No suspicious skin lesions Does feel  some heartburn when turning in bed--abnormal taste (water brash). No trouble swallowing. No meds Bowels move fine---no blood No dysuria--- some urgency though. Wears pad for some leakage Some pain in hands--like when canning--rarely takes tylenol Neighbor gives monthly B12 injections    Objective:   Physical Exam Constitutional:      Appearance: Normal appearance.  HENT:     Mouth/Throat:     Comments: No lesions Eyes:     Conjunctiva/sclera: Conjunctivae normal.     Pupils: Pupils are equal, round, and reactive to light.  Cardiovascular:     Rate and Rhythm: Normal rate and regular rhythm.     Pulses: Normal pulses.     Heart sounds: No murmur heard.   No gallop.  Pulmonary:     Effort: Pulmonary effort is normal.     Breath sounds: Normal breath sounds. No wheezing or rales.  Abdominal:     Palpations: Abdomen is soft.     Tenderness: There is no abdominal tenderness.  Musculoskeletal:     Cervical back: Neck supple.     Right lower leg: No edema.     Left lower leg: No edema.  Lymphadenopathy:     Cervical: No cervical adenopathy.  Skin:    Findings: No lesion or rash.     Comments: Scattered ecchymoses---purplish discoloration right foot  Neurological:     Mental Status: She is alert and oriented to person, place, and time.     Comments: President--"Biden, Trump, Bush----Obama" [id number] Recall 3/3   Psychiatric:        Mood and Affect: Mood normal.        Behavior: Behavior normal.           Assessment & Plan:

## 2021-06-17 ENCOUNTER — Other Ambulatory Visit: Payer: Self-pay | Admitting: Internal Medicine

## 2021-06-17 NOTE — Telephone Encounter (Signed)
Last filled 05-19-21 #30 Last OV 06-10-21 Next OV 06-16-22 [facility]

## 2021-06-24 DIAGNOSIS — Z23 Encounter for immunization: Secondary | ICD-10-CM | POA: Diagnosis not present

## 2021-06-25 ENCOUNTER — Telehealth: Payer: Self-pay | Admitting: Internal Medicine

## 2021-06-25 NOTE — Chronic Care Management (AMB) (Signed)
  Chronic Care Management   Note  06/25/2021 Name: Leslie [name] MRN: [id number] DOB: [birth year]  Leslie [name] is a 87 y.o. year old female who is a primary care patient of [name], [name], MD. I reached out to [name] by phone today in response to a referral sent by [name]'s PCP, [name], MD.   [name] was given information about Chronic Care Management services today including:  CCM service includes personalized support from designated clinical staff supervised by her physician, including individualized plan of care and coordination with other care providers 24/7 contact phone numbers for assistance for urgent and routine care needs. Service will only be billed when office clinical staff spend 20 minutes or more in a month to coordinate care. Only one practitioner may furnish and bill the service in a calendar month. The patient may stop CCM services at any time (effective at the end of the month) by phone call to the office staff.   Patient agreed to services and verbal consent obtained.   Follow up plan:   [name]

## 2021-07-17 ENCOUNTER — Encounter: Payer: Self-pay | Admitting: Interventional Cardiology

## 2021-07-17 ENCOUNTER — Ambulatory Visit (INDEPENDENT_AMBULATORY_CARE_PROVIDER_SITE_OTHER): Payer: Medicare Other | Admitting: Interventional Cardiology

## 2021-07-17 ENCOUNTER — Other Ambulatory Visit: Payer: Self-pay

## 2021-07-17 VITALS — BP 172/72 | HR 59 | Ht 66.0 in | Wt 152.2 lb

## 2021-07-17 DIAGNOSIS — Z8673 Personal history of transient ischemic attack (TIA), and cerebral infarction without residual deficits: Secondary | ICD-10-CM | POA: Diagnosis not present

## 2021-07-17 DIAGNOSIS — I447 Left bundle-branch block, unspecified: Secondary | ICD-10-CM

## 2021-07-17 DIAGNOSIS — I6523 Occlusion and stenosis of bilateral carotid arteries: Secondary | ICD-10-CM

## 2021-07-17 DIAGNOSIS — I10 Essential (primary) hypertension: Secondary | ICD-10-CM

## 2021-07-17 DIAGNOSIS — I5022 Chronic systolic (congestive) heart failure: Secondary | ICD-10-CM

## 2021-07-17 NOTE — Patient Instructions (Signed)

## 2021-07-17 NOTE — Progress Notes (Signed)
Cardiology Office Note:    Date:  07/17/2021   ID:  Leslie [name], DOB [birth year], MRN [id number]  PCP:  [name], MD  Cardiologist:  Sinclair Grooms, MD   Referring MD: [name], MD   Chief Complaint  Patient presents with   Congestive Heart Failure    History of Present Illness:    Leslie [name] is a 87 y.o. female with a hx of of left bundle branch block, chronic diastolic heart failure (EF greater than 50%) progressing to systolic heart failure with EF 30 to 35% 2020, history of CVA, and essential hypertension.    She has no complaints.  She is tolerating [place] without difficulty.  She denies orthopnea, PND, syncope, angina, and peripheral edema.  She had to drive to [place] alone.  She feels this is elevating today's blood pressure.  At the last office visit we decreased the intensity of Toprol because of excessive bradycardia.  This may be allowing the blood pressure to elevate above safe levels.  Past Medical History:  Diagnosis Date   Anemia    Anxiety    Arthritis    Bundle branch block left    CHF (congestive heart failure) (HCC)    chronic combine CHF   Complication of anesthesia    " stomach did not wake up after back surgery "    Corneal burn    left eye-is almost healed"Cascade pod"   Depression    GERD (gastroesophageal reflux disease)    tums   Headache    hx of migraines    Heart failure, left-sided (HCC)    Hypertension    Hypothyroidism    MI (myocardial infarction) ([place])    Nonischemic cardiomyopathy ([place])    '01 EF 30% with normal coronaries; EF 45-50% 03/2012   Shortness of breath    hx of nothing current on 01/17/2015    Stroke [facility])    blurred vision residual    Past Surgical History:  Procedure Laterality Date   CARDIAC CATHETERIZATION     13 yrs. ago   CATARACT EXTRACTION, BILATERAL Bilateral    detached retinia     DILATION AND CURETTAGE OF UTERUS     EYE SURGERY     bilateral cataracts   HERNIA REPAIR      hiatial   hiatal hernia surgery      LUMBAR LAMINECTOMY/DECOMPRESSION MICRODISCECTOMY  05/17/2012   Procedure: LUMBAR LAMINECTOMY/DECOMPRESSION MICRODISCECTOMY 2 LEVELS;  Surgeon: Ophelia Charter, MD;  Location: [place] [facility];  Service: Neurosurgery;  Laterality: N/A;  Lumbar four laminectomy with bilateral Lumbar three laminotomies and removal of synovial cyst   TONSILLECTOMY     TOTAL KNEE ARTHROPLASTY Left 01/21/2015   Procedure: LEFT TOTAL KNEE ARTHROPLASTY;  Surgeon: Paralee Cancel, MD;  Location: [facility];  Service: Orthopedics;  Laterality: Left;   TOTAL KNEE ARTHROPLASTY Right 07/22/2015   Procedure: RIGHT TOTAL KNEE ARTHROPLASTY;  Surgeon: Paralee Cancel, MD;  [facility];  Service: Orthopedics;  Laterality: Right;    Current Medications: Current Meds  Medication Sig   BD INTEGRA SYRINGE 25G X 1" 3 ML MISC USE 1 EVERY 30 (THIRTY) DAYS. WITH VITAMINE B 12 INJECTION   Calcium Carbonate-Vit D-Min (CALCIUM 1200 PO) Take 1 tablet by mouth daily.   citalopram (CELEXA) 20 MG tablet TAKE 1 TABLET BY MOUTH EVERY DAY (Patient taking differently: Take 20 mg by mouth daily.)   clopidogrel (PLAVIX) 75 MG tablet TAKE 1 TABLET BY MOUTH EVERY DAY (Patient  taking differently: Take 75 mg by mouth daily.)   cyanocobalamin (,VITAMIN B-12,) 1000 MCG/ML injection INJECT 1 ML (1,000 MCG TOTAL) INTO THE MUSCLE EVERY 30 (THIRTY) DAYS.   dapagliflozin propanediol (FARXIGA) 10 MG TABS tablet Take 1 tablet (10 mg total) by mouth daily before breakfast.   levothyroxine (SYNTHROID) 75 MCG tablet TAKE 1 TABLET BY MOUTH EVERY DAY   LORazepam (ATIVAN) 1 MG tablet TAKE 1/2 TO 1 TABLET BY MOUTH AT BEDTIME AS NEEDED   metoprolol succinate (TOPROL XL) 25 MG 24 hr tablet Take 0.5 tablets (12.5 mg total) by mouth daily.   NEEDLE, DISP, 25 G (B-D DISP NEEDLE 25GX1") 25G X 1" MISC 1 each by Does not apply route every 30 (thirty) days.   Polyvinyl Alcohol-Povidone (REFRESH OP) Place 1 drop into both eyes in the morning,  at noon, in the evening, and at bedtime.   quinapril (ACCUPRIL) 10 MG tablet TAKE 1 TABLET BY MOUTH EVERY DAY (Patient taking differently: Take 10 mg by mouth daily.)     Allergies:   Sulfa antibiotics, Cephalexin, Dilaudid [hydromorphone hcl], and Xopenex [levalbuterol hcl]   Social History   Socioeconomic History   Marital status: Widowed    Spouse name: Not on file   Number of children: 1   Years of education: Not on file   Highest education level: Not on file  Occupational History   Occupation: Regulatory affairs officer    Comment: retired  Tobacco Use   Smoking status: Never   Smokeless tobacco: Never  Vaping Use   Vaping Use: Never used  Substance and Sexual Activity   Alcohol use: No   Drug use: No   Sexual activity: Never  Other Topics Concern   Not on file  Social History Narrative   Widowed 2015   1 adopted son      Has living will   Son, then nephew [name], are health care POAs   Not sure about DNR--probably would accept resuscitation for now   No machine or tube feeds if cognitively unaware   Social Determinants of Health   Financial Resource Strain: Not on file  Food Insecurity: Not on file  Transportation Needs: Not on file  Physical Activity: Not on file  Stress: Not on file  Social Connections: Not on file     Family History: The patient's family history includes CAD in her mother; Cancer in her sister; Dementia in her father; Diabetes in her sister; Hypertension in her sister.  ROS:   Please see the history of present illness.    No complaints all other systems reviewed and are negative.  EKGs/Labs/Other Studies Reviewed:    The following studies were reviewed today: No new imaging  EKG:  EKG not performed  Recent Labs: 04/15/2021: ALT 10; B Natriuretic Peptide 159.3; BUN 19; Creatinine, Ser 1.01; Hemoglobin 13.1; Platelets 173; Potassium 4.3; Sodium 138  Recent Lipid Panel    Component Value Date/Time   CHOL 194 07/06/2017 1300   TRIG 131.0  07/06/2017 1300   HDL 56.20 07/06/2017 1300   CHOLHDL 3 07/06/2017 1300   VLDL 26.2 07/06/2017 1300   LDLCALC 111 (H) 07/06/2017 1300    Physical Exam:    VS:  BP (!) 172/72   Pulse (!) 59   Ht 5\' 6"  (1.676 m)   Wt 152 lb 3.2 oz (69 kg)   SpO2 97%   BMI 24.57 kg/m     Wt Readings from Last 3 Encounters:  07/17/21 152 lb 3.2 oz (69 kg)  06/10/21 150 lb (68 kg)  04/20/21 148 lb (67.1 kg)     GEN: Compatible with age. No acute distress HEENT: Normal NECK: No JVD. LYMPHATICS: No lymphadenopathy CARDIAC: No murmur. RRR no gallop, or edema. VASCULAR:  Normal Pulses. No bruits. RESPIRATORY:  Clear to auscultation without rales, wheezing or rhonchi  ABDOMEN: Soft, non-tender, non-distended, No pulsatile mass, MUSCULOSKELETAL: No deformity  SKIN: Warm and dry NEUROLOGIC:  Alert and oriented x 3 PSYCHIATRIC:  Normal affect   ASSESSMENT:    1. Chronic systolic heart failure ([place])   2. Essential hypertension   3. LBBB (left bundle branch block)   4. History of CVA (cerebrovascular accident)    PLAN:    In order of problems listed above:  Continue Farxiga, Toprol, and may need to increase Accupril or switch to [place] depending upon blood pressure follow-up. Blood pressure is elevated.  We decreased Toprol because of bradycardia less than 50.  Need to increase Accupril to at least 20 mg/day but will allow to gather additional data over the weekend and make final decision about increasing Accupril dose based upon the data that she gets from home.  She feels that having to drive in traffic without help is likely the reason that her blood pressure is elevated today. Did not discuss for repeat EKG No new neurological complaints.  Guideline directed therapy for left ventricular systolic dysfunction: Angiotensin receptor-neprilysin inhibitor (ARNI)-Entresto; beta-blocker therapy - carvedilol, metoprolol succinate, or bisoprolol; mineralocorticoid receptor antagonist (MRA) therapy  -spironolactone or eplerenone.  SGLT-2 agents -  Dapagliflozin [place]) or Empagliflozin ([place]).These therapies have been shown to improve clinical outcomes including reduction of rehospitalization, survival, and acute heart failure.    Medication Adjustments/Labs and Tests Ordered: Current medicines are reviewed at length with the patient today.  Concerns regarding medicines are outlined above.  No orders of the defined types were placed in this encounter.  No orders of the defined types were placed in this encounter.   Patient Instructions  Medication Instructions:  Your physician recommends that you continue on your current medications as directed. Please refer to the Current Medication list given to you today.  *If you need a refill on your cardiac medications before your next appointment, please call your pharmacy*   Lab Work: None If you have labs (blood work) drawn today and your tests are completely normal, you will receive your results only by: [place] (if you have MyChart) OR A paper copy in the mail If you have any lab test that is abnormal or we need to change your treatment, we will call you to review the results.   Testing/Procedures: None   Follow-Up: At [facility], you and your health needs are our priority.  As part of our continuing mission to provide you with exceptional heart care, we have created designated Provider Care Teams.  These Care Teams include your primary Cardiologist (physician) and Advanced Practice Providers (APPs -  Physician Assistants and Nurse Practitioners) who all work together to provide you with the care you need, when you need it.  We recommend signing up for the patient portal called "MyChart".  Sign up information is provided on this After Visit Summary.  MyChart is used to connect with patients for Virtual Visits (Telemedicine).  Patients are able to view lab/test results, encounter notes, upcoming appointments, etc.   Non-urgent messages can be sent to your provider as well.   To learn more about what you can do with MyChart, go to [email or web address].  Your next appointment:   1 year(s)  The format for your next appointment:   In Person  Provider:   Sinclair Grooms, MD     Other Instructions     Signed, Sinclair Grooms, MD  07/17/2021 5:24 PM    [place]

## 2021-07-20 ENCOUNTER — Telehealth: Payer: Self-pay | Admitting: Interventional Cardiology

## 2021-07-20 NOTE — Telephone Encounter (Signed)
Pt with elevated BP at OV on 12/9.  She was told to call with readings after the weekend.  Will route to Dr. Tamala Julian for review.

## 2021-07-20 NOTE — Telephone Encounter (Signed)
New Message:     Patient was tod to call today and give her blood pressure readings from:  07-18-21-  155/74 - morning 135/69- afternoon    07-19-21-   129/89- morning   136/73- afternoon

## 2021-07-21 ENCOUNTER — Other Ambulatory Visit: Payer: Self-pay | Admitting: Internal Medicine

## 2021-07-21 NOTE — Telephone Encounter (Signed)
Last filled 06-19-21 #30 Last OV 06-10-21 Next OV 06-16-22 [facility]

## 2021-07-21 NOTE — Telephone Encounter (Signed)
Spoke with pt and made her aware that Dr. Tamala Julian said BPs improved and no action required.  Advised pt to call if she sees numbers trending up.  Pt verbalized understanding and was appreciative for call.

## 2021-07-24 ENCOUNTER — Telehealth: Payer: Self-pay

## 2021-07-24 NOTE — Chronic Care Management (AMB) (Signed)
Chronic Care Management Pharmacy Assistant   Name: MATALIE ROMBERGER  MRN: [id number] DOB: April 11, 1931  Leslie [name] is an 87 y.o. year old female who presents for his initial CCM visit with the clinical pharmacist.  Reason for Encounter: Initial Questions   Conditions to be addressed/monitored: HTN and CKD Stage 3b   Recent office visits:  06/10/21-PCP-Richard [name],MD-Patient presented for AWV.Discussed screenings,has gotten flu shot, vaccines discussed-no medication changes, follow up 1 year 04/20/21-PCP-Richard [name],MD-Patient presented for follow up from ER visit.Pain left clavicle area.No medication changes  Recent consult visits:  07/17/21-Cardiology-[name],MD-May need to increase Accupril or switch to Entresto depending on BP follow up. We decreased Toprol because of bradycardia less than 50.  Need to increase Accupril to at least 20 mg/day but will allow to gather additional data over the weekend and make final decision about increasing Accupril dose based upon the data that she gets from home. Follow up 1 year.  Hospital visits:  04/15/21-[facility] presented for chest wall pain.Labs,EKG,Xrays ordered,normal findings-No admission  Medications: Outpatient Encounter Medications as of 07/24/2021  Medication Sig   BD INTEGRA SYRINGE 25G X 1" 3 ML MISC USE 1 EVERY 30 (THIRTY) DAYS. WITH VITAMINE B 12 INJECTION   Calcium Carbonate-Vit D-Min (CALCIUM 1200 PO) Take 1 tablet by mouth daily.   citalopram (CELEXA) 20 MG tablet TAKE 1 TABLET BY MOUTH EVERY DAY (Patient taking differently: Take 20 mg by mouth daily.)   clopidogrel (PLAVIX) 75 MG tablet TAKE 1 TABLET BY MOUTH EVERY DAY (Patient taking differently: Take 75 mg by mouth daily.)   cyanocobalamin (,VITAMIN B-12,) 1000 MCG/ML injection INJECT 1 ML (1,000 MCG TOTAL) INTO THE MUSCLE EVERY 30 (THIRTY) DAYS.   dapagliflozin propanediol (FARXIGA) 10 MG TABS tablet Take 1 tablet (10 mg  total) by mouth daily before breakfast.   levothyroxine (SYNTHROID) 75 MCG tablet TAKE 1 TABLET BY MOUTH EVERY DAY   LORazepam (ATIVAN) 1 MG tablet TAKE 1/2 TO 1 TABLET BY MOUTH AT BEDTIME AS NEEDED   metoprolol succinate (TOPROL XL) 25 MG 24 hr tablet Take 0.5 tablets (12.5 mg total) by mouth daily.   NEEDLE, DISP, 25 G (B-D DISP NEEDLE 25GX1") 25G X 1" MISC 1 each by Does not apply route every 30 (thirty) days.   Polyvinyl Alcohol-Povidone (REFRESH OP) Place 1 drop into both eyes in the morning, at noon, in the evening, and at bedtime.   quinapril (ACCUPRIL) 10 MG tablet TAKE 1 TABLET BY MOUTH EVERY DAY (Patient taking differently: Take 10 mg by mouth daily.)   No facility-administered encounter medications on file as of 07/24/2021.   No results found for: HGBA1C, MICROALBUR   BP Readings from Last 3 Encounters:  07/17/21 (!) 172/72  06/10/21 134/86  04/20/21 140/88    Patient contacted to review initial questions prior to visit with [name].  Have you seen any other providers since your last visit with PCP? Yes  07/17/21-Cardiology  Any changes in your medications or health? No  Any side effects from any medications? No  Do you have an symptoms or problems not managed by your medications? No  Any concerns about your health right now? No  Has your provider asked that you check blood pressure, blood sugar, or follow special diet at home? Yes  The patient reports she does have a BP monitor. She was asked to take at home after the last specialist office visit .  Do you get any type of exercise on a  regular basis?  Nothing formal however, the patient has goats she has to feed, she also does crafts at home this time of year for shows in the area , sewing keeps her busy.  Can you think of a goal you would like to reach for your health? No  Do you have any problems getting your medications? The patient uses [facility] and she reports sometimes they get things mixed  up.   Is there anything that you would like to discuss during the appointment? No   Spoke with patient and reminded them to have all medications, supplements and any blood glucose and blood pressure readings available for review with pharmacist, at their telephone visit on 07/30/21 at 2:00pm..   Star Rating Drugs:  Medication:  Last Fill: Day Supply Farxiga 10mg   07/23/21 30 Quinapril 10mg  06/13/21 90   Care Gaps: Annual wellness visit in last year? Yes Most Recent BP reading: 172/72  59-P      Office had her to take BP at home that day and the SBP came down to normal limits .    Marjo Bicker, CPP notified  Avel Sensor, [place] Assistant [phone]  Total time spent for month CPA: 40 min.

## 2021-07-30 ENCOUNTER — Other Ambulatory Visit: Payer: Self-pay

## 2021-07-30 ENCOUNTER — Ambulatory Visit (INDEPENDENT_AMBULATORY_CARE_PROVIDER_SITE_OTHER): Payer: Medicare Other | Admitting: Pharmacist

## 2021-07-30 VITALS — BP 136/73

## 2021-07-30 DIAGNOSIS — I10 Essential (primary) hypertension: Secondary | ICD-10-CM

## 2021-07-30 DIAGNOSIS — E039 Hypothyroidism, unspecified: Secondary | ICD-10-CM

## 2021-07-30 DIAGNOSIS — I5042 Chronic combined systolic (congestive) and diastolic (congestive) heart failure: Secondary | ICD-10-CM

## 2021-07-30 DIAGNOSIS — Z8673 Personal history of transient ischemic attack (TIA), and cerebral infarction without residual deficits: Secondary | ICD-10-CM

## 2021-07-30 DIAGNOSIS — F39 Unspecified mood [affective] disorder: Secondary | ICD-10-CM

## 2021-07-30 NOTE — Patient Instructions (Signed)
Visit Information  Phone number for Pharmacist: [phone]  Thank you for meeting with me to discuss your medications! I look forward to working with you to achieve your health care goals. Below is a summary of what we talked about during the visit:   Goals Addressed             This Visit's Progress    Manage My Medicine       Timeframe:  Long-Range Goal Priority:  Medium Start Date:      07/30/21                       Expected End Date:    07/30/22                   Follow Up Date June 2023   - call for medicine refill 2 or 3 days before it runs out - call if I am sick and can't take my medicine - keep a list of all the medicines I take; vitamins and herbals too  -collaborate with prescriber to get [facility] Glade through [employer] for free   Why is this important?   These steps will help you keep on track with your medicines.   Notes:         Care Plan : [place]  Updates made by [name], RPH since 07/30/2021 12:00 AM     Problem: Hypertension, Hyperlipidemia, Heart Failure, Chronic Kidney Disease, Hypothyroidism, Depression, and Anxiety   Priority: High     Long-Range Goal: Disease mgmt   Start Date: 07/30/2021  Expected End Date: 07/30/2022  This Visit's Progress: On track  Priority: High  Note:   Current Barriers:  Unable to independently afford treatment regimen Unable to independently monitor therapeutic efficacy  Pharmacist Clinical Goal(s):  Patient will verbalize ability to afford treatment regimen achieve adherence to monitoring guidelines and medication adherence to achieve therapeutic efficacy through collaboration with PharmD and provider.   Interventions: 1:1 collaboration with [name], MD regarding development and update of comprehensive plan of care as evidenced by provider attestation and co-signature Inter-disciplinary care team collaboration (see longitudinal plan of care) Comprehensive medication review  performed; medication list updated in electronic medical record  Hypertension / Heart Failure (BP goal <140/90) -Controlled -Last ejection fraction: 30-35% (Date: 06/27/19) -HF type: Combined Systolic and Diastolic -Current home BP/HR readings: 136/73 -Current treatment: Metoprolol succinate 25 mg - 1/2 tab daily Quinapril 10 mg daily Farxiga 10 mg daily  -Denies hypotensive/hypertensive symptoms -Educated on BP goals and benefits of medications for prevention of heart attack, stroke and kidney damage; -Educated on Benefits of medications for managing symptoms and prolonging life -Counseled to monitor BP at home periodically -Recommended to continue current medication  Hyperlipidemia / hx CVA (LDL goal < 70) -Unable to assess control, last lipid panel was 06/2017 and LDL was 111; pt endorses compliance with clopidogrel -Never on statin -Current treatment: Clopidogrel 75 mg daily -Educated on Cholesterol goals; Importance of limiting foods high in cholesterol; -Given patient age and risk vs. benefit of statin initiation, it is reasonable to continue without statin -Recommended to continue current medication  Mood disorder (Goal: manage symptoms) -Controlled - pt report she was put on citalopram after her husband passed, she did not have what she considers depression; she would like to avoid making changes at her age and will continue medication; she reports she can't sleep without lorazepam -Current treatment: Citalopram 20 mg daily Lorazepam 1 mg -  1/2 to 1 tab HS prn -Pt is fairly dependent on lorazepam for sleep however she has been on it many years, she is not abusing it or at risk for overdose so it is reasonable to continue -Recommended to continue current medication  Hypothyroidism (Goal: maintain TSH in goal range) -Controlled -Current treatment  Levothyroxine 75 mg daily -Recommended to continue current medication  Health Maintenance -Vaccine gaps: Shingrix -Hx CKD  stage 3a - BP under control, does not take NSAIDs -Current therapy:  Vitamin B12 injection q30 days (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 Colasurdo was given information about Chronic Care Management services today including:  CCM service includes personalized support from designated clinical staff supervised by her physician, including individualized plan of care and coordination with other care providers 24/7 contact phone numbers for assistance for urgent and routine care needs. Standard insurance, coinsurance, copays and deductibles apply for chronic care management only during months in which we provide at least 20 minutes of these services. Most insurances cover these services at 100%, however patients may be responsible for any copay, coinsurance and/or deductible if applicable. This service may help you avoid the need for more expensive face-to-face services. Only one practitioner may furnish and bill the service in a calendar month. The patient may stop CCM services at any time (effective at the end of the month) by phone call to the office staff.  Patient agreed to services and verbal consent obtained.   Patient verbalizes understanding of instructions provided today and agrees to view in [place].  Telephone follow up appointment with pharmacy team member scheduled for: 6 months  [name], PharmD, [facility] Clinical Pharmacist [facility] [phone]

## 2021-07-30 NOTE — Progress Notes (Signed)
Chronic Care Management Pharmacy Note  07/30/2021 Name:  Leslie [name] MRN:  [id number] DOB:  07/31/1931  Summary: -Pt reports compliance with medications as prescribed -Pt has history of remote CVA (~2004?) and has never been on a statin. Last lipid panel 06/2017, LDL was 111. Given pt age and limited benefit of statin at this point, it is reasonable continue without statin -Per cardiology pt has been approved for [place] pt assistance through [employer], however she has still been filling this at their pharmacy and has not heard from [employer]  Recommendations/Changes made from today's visit: -Consider updating Lipid panel with next PCP visit -F/U with [employer] re: Farxiga delivery  Plan: -[place] will call [employer] to coordinate [place] delivery over next 7 days -Pharmacist follow up televisit scheduled for 6 months    Subjective: Leslie [name] is an 87 y.o. year old female who is a primary patient of [name], [name], MD.  The CCM team was consulted for assistance with disease management and care coordination needs.    Engaged with patient by telephone for initial visit in response to provider referral for pharmacy case management and/or care coordination services.   Consent to Services:  The patient was given the following information about Chronic Care Management services today, agreed to services, and gave verbal consent: 1. CCM service includes personalized support from designated clinical staff supervised by the primary care provider, including individualized plan of care and coordination with other care providers 2. 24/7 contact phone numbers for assistance for urgent and routine care needs. 3. Service will only be billed when office clinical staff spend 20 minutes or more in a month to coordinate care. 4. Only one practitioner may furnish and bill the service in a calendar month. 5.The patient may stop CCM services at any time (effective at the end of the month) by phone call  to the office staff. 6. The patient will be responsible for cost sharing (co-pay) of up to 20% of the service fee (after annual deductible is met). Patient agreed to services and consent obtained.  Patient Care Team: [name], MD as PCP - General (Internal Medicine) [name], MD as PCP - Cardiology (Cardiology) [name], [facility] as Pharmacist (Pharmacist)  Recent office visits: 06/10/21-PCP-Richard [name],MD-Patient presented for AWV.Discussed screenings,has gotten flu shot, vaccines discussed-no medication changes, follow up 1 year  04/20/21-PCP-Richard [name],MD-Patient presented for follow up from ER visit.Pain left clavicle area.No medication changes   Recent consult visits: 07/17/21-Cardiology-[name],MD-May need to increase Accupril or switch to Entresto depending on BP follow up. We decreased Toprol because of bradycardia less than 50.  Need to increase Accupril to at least 20 mg/day but will allow to gather additional data over the weekend and make final decision about increasing Accupril dose based upon the data that she gets from home. Follow up 1 year. --BP was normal at home, no action required.  Hospital visits: 04/15/21-[facility] presented for chest wall pain.Labs,EKG,Xrays ordered,normal findings-No admission   Objective:  Lab Results  Component Value Date   CREATININE 1.01 (H) 04/15/2021   BUN 19 04/15/2021   GFR 55.16 (L) 06/06/2020   GFRNONAA 54 (L) 04/15/2021   GFRAA 60 10/03/2020   NA 138 04/15/2021   K 4.3 04/15/2021   CALCIUM 8.7 (L) 04/15/2021   CO2 23 04/15/2021   GLUCOSE 91 04/15/2021    Lab Results  Component Value Date/Time   GFR 55.16 (L) 06/06/2020 12:46 PM   GFR  43.72 (L) 12/19/2019 10:41 AM    Last diabetic Eye exam: No results found for: HMDIABEYEEXA  Last diabetic Foot exam: No results found for: HMDIABFOOTEX   Lab Results  Component Value Date   CHOL 194 07/06/2017   HDL  56.20 07/06/2017   LDLCALC 111 (H) 07/06/2017   TRIG 131.0 07/06/2017   CHOLHDL 3 07/06/2017    Hepatic Function Latest Ref Rng & Units 04/15/2021 06/06/2020 06/06/2020  Total Protein 6.5 - 8.1 g/dL 6.1(L) - 6.5  Albumin 3.5 - 5.0 g/dL 3.3(L) 4.1 4.1  AST 15 - 41 U/L 16 - 13  ALT 0 - 44 U/L 10 - 7  Alk Phosphatase 38 - 126 U/L 39 - 52  Total Bilirubin 0.3 - 1.2 mg/dL 0.8 - 0.7  Bilirubin, Direct 0.0 - 0.3 mg/dL - - 0.1    Lab Results  Component Value Date/Time   TSH 6.28 (H) 06/06/2020 12:46 PM   TSH 2.39 06/05/2019 11:54 AM   FREET4 0.87 06/06/2020 12:46 PM   FREET4 0.99 12/19/2019 10:41 AM    CBC Latest Ref Rng & Units 04/15/2021 06/06/2020 12/19/2019  WBC 4.0 - 10.5 K/uL 5.4 5.3 5.4  Hemoglobin 12.0 - 15.0 g/dL 13.1 13.7 13.4  Hematocrit 36.0 - 46.0 % 42.2 41.8 40.3  Platelets 150 - 400 K/uL 173 195.0 226.0    Lab Results  Component Value Date/Time   VD25OH 17.49 (L) 06/06/2020 12:46 PM    Clinical ASCVD: Yes  - CVA The ASCVD Risk score (Arnett DK, et al., 2019) failed to calculate for the following reasons:   The 2019 ASCVD risk score is only valid for ages 52 to 52    Depression screen PHQ 2/9 06/10/2021 01/13/2018  Decreased Interest 0 0  Down, Depressed, Hopeless 0 0  PHQ - 2 Score 0 0     Social History   Tobacco Use  Smoking Status Never  Smokeless Tobacco Never   BP Readings from Last 3 Encounters:  07/30/21 136/73  07/17/21 (!) 172/72  06/10/21 134/86   Pulse Readings from Last 3 Encounters:  07/17/21 (!) 59  06/10/21 60  04/20/21 60   Wt Readings from Last 3 Encounters:  07/17/21 152 lb 3.2 oz (69 kg)  06/10/21 150 lb (68 kg)  04/20/21 148 lb (67.1 kg)   BMI Readings from Last 3 Encounters:  07/17/21 24.57 kg/m  06/10/21 24.21 kg/m  04/20/21 23.18 kg/m    Assessment/Interventions: Review of patient past medical history, allergies, medications, health status, including review of consultants reports, laboratory and other test data, was  performed as part of comprehensive evaluation and provision of chronic care management services.   SDOH:  (Social Determinants of Health) assessments and interventions performed: Yes SDOH Interventions    Flowsheet Row Most Recent Value  SDOH Interventions   Financial Strain Interventions Intervention Not Indicated      SDOH Screenings   Alcohol Screen: Not on file  Depression (PHQ2-9): Low Risk    PHQ-2 Score: 0  Financial Resource Strain: Low Risk    Difficulty of Paying Living Expenses: Not hard at all  Food Insecurity: Not on file  Housing: Not on file  Physical Activity: Not on file  Social Connections: Not on file  Stress: Not on file  Tobacco Use: Low Risk    Smoking Tobacco Use: Never   Smokeless Tobacco Use: Never   Passive Exposure: Not on file  Transportation Needs: Not on file    [place]  Allergies  Allergen Reactions  Sulfa Antibiotics Other (See Comments)    "urine crystallizes"   Cephalexin Diarrhea   Dilaudid [Hydromorphone Hcl]         Xopenex [Levalbuterol Hcl] Other (See Comments)    "gives me the shakes"    Medications Reviewed Today     Reviewed by [name], [facility] (Pharmacist) on 07/30/21 at 1523  Med List Status: <None>   Medication Order Taking? Sig Documenting Provider Last Dose Status Informant  BD INTEGRA SYRINGE 25G X 1" 3 ML MISC [id number] Yes USE 1 EVERY 30 (THIRTY) DAYS. WITH VITAMINE B 12 INJECTION [name], MD Taking Active Self  Calcium Carbonate-Vit D-Min (CALCIUM 1200 PO) [id number] Yes Take 1 tablet by mouth daily. [provider] Taking Active Self  citalopram (CELEXA) 20 MG tablet [id number] Yes TAKE 1 TABLET BY MOUTH EVERY DAY  Patient taking differently: Take 20 mg by mouth daily.   [name], MD Taking Active   clopidogrel (PLAVIX) 75 MG tablet [id number] Yes TAKE 1 TABLET BY MOUTH EVERY DAY  Patient taking differently: Take 75 mg by mouth daily.   [name], MD Taking  Active   cyanocobalamin (,VITAMIN B-12,) 1000 MCG/ML injection [id number] Yes INJECT 1 ML (1,000 MCG TOTAL) INTO THE MUSCLE EVERY 30 (THIRTY) DAYS. [name], MD Taking Active Self  dapagliflozin propanediol (FARXIGA) 10 MG TABS tablet [id number] Yes Take 1 tablet (10 mg total) by mouth daily before breakfast. [name], MD Taking Active Self  levothyroxine (SYNTHROID) 75 MCG tablet [id number] Yes TAKE 1 TABLET BY MOUTH EVERY DAY [name], MD Taking Active   LORazepam (ATIVAN) 1 MG tablet [id number] Yes TAKE 1/2 TO 1 TABLET BY MOUTH AT BEDTIME AS NEEDED [name], MD Taking Active   metoprolol succinate (TOPROL XL) 25 MG 24 hr tablet [id number] Yes Take 0.5 tablets (12.5 mg total) by mouth daily. [name], MD Taking Active Self  NEEDLE, DISP, 25 G (B-D DISP NEEDLE 25GX1") 25G X 1" [place] [id number] Yes 1 each by Does not apply route every 30 (thirty) days. [name], MD Taking Active Self  Polyvinyl Alcohol-Povidone [facility] OP) [id number] Yes Place 1 drop into both eyes in the morning, at noon, in the evening, and at bedtime. [provider] Taking Active Self  quinapril (ACCUPRIL) 10 MG tablet [id number] Yes TAKE 1 TABLET BY MOUTH EVERY DAY  Patient taking differently: Take 10 mg by mouth daily.   [name], MD Taking Active             Patient Active Problem List   Diagnosis Date Noted   Acquired thrombophilia ([place]) 06/05/2019   Preventative health care 01/13/2018   Carotid artery disease ([place]) 01/13/2018   Stage 3b chronic kidney disease ([place]) 01/13/2018   Advance directive discussed with patient 01/13/2018   GERD (gastroesophageal reflux disease) 07/06/2017   Mood disorder ([place]) 07/06/2017   Vitamin B12 deficiency 07/06/2017   History of CVA (cerebrovascular accident) 07/06/2017   LBBB (left bundle branch block) 06/16/2014   Chronic combined systolic and diastolic heart failure ([place]) 06/16/2014   Hypothyroidism 05/25/2012   HTN  (hypertension) 05/25/2012    Immunization History  Administered Date(s) Administered   Fluad Quad(high Dose 65+) 06/05/2019, 06/06/2020, 04/20/2021   Influenza,inj,Quad PF,6+ Mos 05/10/2018   Influenza-Unspecified 05/09/2017   PFIZER(Purple Top)SARS-COV-2 Vaccination 08/30/2019, 09/20/2019, 08/18/2020   Pfizer Covid-19 Vaccine Bivalent Booster 6yr & up 06/24/2021   Pneumococcal Conjugate-13 02/04/2014   Pneumococcal Polysaccharide-23 08/10/2007, 01/13/2018  Td 08/10/2007   Tdap 07/02/2020   Zoster, Live 02/22/2013    Conditions to be addressed/monitored:  Hypertension, Hyperlipidemia, Heart Failure, Chronic Kidney Disease, Hypothyroidism, Depression, and Anxiety  Care Plan : [place]  Updates made by [name], [place] since 07/30/2021 12:00 AM     Problem: Hypertension, Hyperlipidemia, Heart Failure, Chronic Kidney Disease, Hypothyroidism, Depression, and Anxiety   Priority: High     Long-Range Goal: Disease mgmt   Start Date: 07/30/2021  Expected End Date: 07/30/2022  This Visit's Progress: On track  Priority: High  Note:   Current Barriers:  Unable to independently afford treatment regimen Unable to independently monitor therapeutic efficacy  Pharmacist Clinical Goal(s):  Patient will verbalize ability to afford treatment regimen achieve adherence to monitoring guidelines and medication adherence to achieve therapeutic efficacy through collaboration with PharmD and provider.   Interventions: 1:1 collaboration with [name], MD regarding development and update of comprehensive plan of care as evidenced by provider attestation and co-signature Inter-disciplinary care team collaboration (see longitudinal plan of care) Comprehensive medication review performed; medication list updated in electronic medical record  Hypertension / Heart Failure (BP goal <140/90) -Controlled -Last ejection fraction: 30-35% (Date: 06/27/19) -HF type:  Combined Systolic and Diastolic -Current home BP/HR readings: 136/73 -Current treatment: Metoprolol succinate 25 mg - 1/2 tab daily Quinapril 10 mg daily Farxiga 10 mg daily  -Denies hypotensive/hypertensive symptoms -Educated on BP goals and benefits of medications for prevention of heart attack, stroke and kidney damage; -Educated on Benefits of medications for managing symptoms and prolonging life -Counseled to monitor BP at home periodically -Recommended to continue current medication  Hyperlipidemia / hx CVA (LDL goal < 70) -Unable to assess control, last lipid panel was 06/2017 and LDL was 111; pt endorses compliance with clopidogrel -Never on statin -Current treatment: Clopidogrel 75 mg daily -Educated on Cholesterol goals; Importance of limiting foods high in cholesterol; -Given patient age and risk vs. benefit of statin initiation, it is reasonable to continue without statin -Recommended to continue current medication  Mood disorder (Goal: manage symptoms) -Controlled - pt report she was put on citalopram after her husband passed, she did not have what she considers depression; she would like to avoid making changes at her age and will continue medication; she reports she can't sleep without lorazepam -Current treatment: Citalopram 20 mg daily Lorazepam 1 mg - 1/2 to 1 tab HS prn -Pt is fairly dependent on lorazepam for sleep however she has been on it many years, she is not abusing it or at risk for overdose so it is reasonable to continue -Recommended to continue current medication  Hypothyroidism (Goal: maintain TSH in goal range) -Controlled -Current treatment  Levothyroxine 75 mg daily -Recommended to continue current medication  Health Maintenance -Vaccine gaps: Shingrix -Hx CKD stage 3a - BP under control, does not take NSAIDs -Current therapy:  Vitamin B12 injection q30 days (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  Compliance/Adherence/Medication fill history: Care Gaps: DEXA (never done)  Star-Rating Drugs: Quinapril 10 mg - LF 06/13/21 x 90 ds;  [place] 100% Farxiga 10 mg - LF 07/23/21 x 30 ds; [place] 100%  Patient's preferred pharmacy [facility]:  [id number] [place] [id number]: [phone]: [phone] Uses pill box? Yes Pt endorses 100% compliance  We discussed: Current pharmacy is preferred with insurance plan and patient is satisfied with pharmacy services Patient decided to: Continue current medication management strategy  Care Plan and Follow Up Patient Decision:  Patient agrees to Care Plan and Follow-up.  Plan: Telephone follow up appointment with care management team member scheduled for:  6 months  LCharlene Brooke PharmD, BCACP Clinical Pharmacist [phone]

## 2021-08-08 DIAGNOSIS — I5040 Unspecified combined systolic (congestive) and diastolic (congestive) heart failure: Secondary | ICD-10-CM

## 2021-08-08 DIAGNOSIS — E039 Hypothyroidism, unspecified: Secondary | ICD-10-CM

## 2021-08-08 DIAGNOSIS — F4320 Adjustment disorder, unspecified: Secondary | ICD-10-CM

## 2021-08-08 DIAGNOSIS — I110 Hypertensive heart disease with heart failure: Secondary | ICD-10-CM

## 2021-08-08 DIAGNOSIS — E785 Hyperlipidemia, unspecified: Secondary | ICD-10-CM | POA: Diagnosis not present

## 2021-08-14 ENCOUNTER — Telehealth: Payer: Self-pay

## 2021-08-14 ENCOUNTER — Telehealth: Payer: Self-pay | Admitting: Internal Medicine

## 2021-08-14 NOTE — Telephone Encounter (Signed)
Leslie [name] called in and stated that Leslie [name] was suppose to get back in touch ewith her to see about getting the dapagliflozin propanediol (FARXIGA) 10 MG TABS tablet under Dr. Silvio Pate due to she was pasying 100+ for the medication.

## 2021-08-14 NOTE — Telephone Encounter (Signed)
Sounds like a tier exemption. I have not received anything for the pharmacy. I will have to check CoverMyMeds

## 2021-08-14 NOTE — Telephone Encounter (Signed)
Spoke to pt. She said when she had her CCM appt with Sharyn Lull, she was going to get pt asst paperwork together. I do not remember receiving a message as I usually do about the paperwork being brought here. Will send to [facility] for clarification.

## 2021-08-17 ENCOUNTER — Telehealth: Payer: Self-pay | Admitting: Pharmacist

## 2021-08-17 NOTE — Telephone Encounter (Signed)
-----   Message from [place], [place] sent at 08/14/2021  4:34 PM EST ----- Regarding: RE: Call [employer]Me  I called the patient and she wants to apply, I will fill out forms and get to you Monday , advised her to call Dr.Smith for refill cause she is almost out. I also made her appt with you for discussion if needed later this month. She thinks Silvio Pate will RX the farxoga due to a call she got from the  PCP office . I told her Dr.Smith name on it so call him first to ask for refill till we get this rolling.[place]  ----- Message ----- From: [name], [facility] Sent: 08/11/2021  10:00 AM EST To: Avel Sensor, CMA Subject: RE: Call AZ&Me                                 Please let patient know - if she would like to re-apply this year, she can either contact cardiology or I can apply online for her (please set up f/u phone appt if she wants to go this route).  ----- Message ----- From: Avel Sensor, CMA Sent: 08/04/2021  11:58 AM EST To: [name], RPH Subject: RE: Call AZ&Me                                 Contacted [employer]Me and they have no record of enrollment for [place] for the patient. They did give me a reference # [id number]. [place]  ----- Message ----- From: [name], [facility] Sent: 08/04/2021   8:00 AM EST To: Avel Sensor, CMA Subject: Call AZ&Me                                     Please call [employer]Me to check on the status of her [place] - according to cardiology notes she was approved in October but she never started receiving Farxiga from them. Do they need an Rx, or what?  Expect to be on hold for ~an hour or so. I would call from another line while you are working on something else. Also their hours this week are 9am-3pm.  Prescriber info: [name], MD  [address] [place], [place] [address]  Phone:  [phone]  Fax:  [phone]  DEA #:  [id number]  NPI:  [id number]

## 2021-08-17 NOTE — Telephone Encounter (Signed)
F/U CCM visit scheduled to address Farxiga PAP.

## 2021-08-18 NOTE — Telephone Encounter (Signed)
See telephone note from 1/9

## 2021-08-18 NOTE — Progress Notes (Signed)
Entered in error

## 2021-08-24 ENCOUNTER — Telehealth: Payer: Self-pay

## 2021-08-24 NOTE — Chronic Care Management (AMB) (Signed)
° ° °  Chronic Care Management Pharmacy Assistant   Name: Leslie [name]  MRN: [id number] DOB: 09/08/1930   Reason for Encounter: Reminder Call   Conditions to be addressed/monitored: HTN and CKD Stage 3b   Medications: Outpatient Encounter Medications as of 08/24/2021  Medication Sig   BD INTEGRA SYRINGE 25G X 1" 3 ML MISC USE 1 EVERY 30 (THIRTY) DAYS. WITH VITAMINE B 12 INJECTION   Calcium Carbonate-Vit D-Min (CALCIUM 1200 PO) Take 1 tablet by mouth daily.   citalopram (CELEXA) 20 MG tablet TAKE 1 TABLET BY MOUTH EVERY DAY (Patient taking differently: Take 20 mg by mouth daily.)   clopidogrel (PLAVIX) 75 MG tablet TAKE 1 TABLET BY MOUTH EVERY DAY (Patient taking differently: Take 75 mg by mouth daily.)   cyanocobalamin (,VITAMIN B-12,) 1000 MCG/ML injection INJECT 1 ML (1,000 MCG TOTAL) INTO THE MUSCLE EVERY 30 (THIRTY) DAYS.   dapagliflozin propanediol (FARXIGA) 10 MG TABS tablet Take 1 tablet (10 mg total) by mouth daily before breakfast.   levothyroxine (SYNTHROID) 75 MCG tablet TAKE 1 TABLET BY MOUTH EVERY DAY   LORazepam (ATIVAN) 1 MG tablet TAKE 1/2 TO 1 TABLET BY MOUTH AT BEDTIME AS NEEDED   metoprolol succinate (TOPROL XL) 25 MG 24 hr tablet Take 0.5 tablets (12.5 mg total) by mouth daily.   NEEDLE, DISP, 25 G (B-D DISP NEEDLE 25GX1") 25G X 1" MISC 1 each by Does not apply route every 30 (thirty) days.   Polyvinyl Alcohol-Povidone (REFRESH OP) Place 1 drop into both eyes in the morning, at noon, in the evening, and at bedtime.   quinapril (ACCUPRIL) 10 MG tablet TAKE 1 TABLET BY MOUTH EVERY DAY (Patient taking differently: Take 10 mg by mouth daily.)   No facility-administered encounter medications on file as of 08/24/2021.   [name] was contacted to remind of upcoming telephone visit with [name] on 08/31/21 at 3:30pm. Patient was reminded to have all medications, supplements and any blood glucose and blood pressure readings available for review at appointment. If  unable to reach, a voicemail was left for patient.    Are you having any problems with your medications? No   Do you have any concerns you like to discuss with the pharmacist? Yes  discuss Farxiga medication     Star Rating Drugs: Medication:  Last Fill: Day Supply Farxiga 10mg   08/14/21  30 Quinapril 10mg  06/13/21 [place], CPP notified  Avel Sensor, [place] Assistant [phone]  Total time spent for month CPA: 10 min

## 2021-08-27 ENCOUNTER — Other Ambulatory Visit: Payer: Self-pay | Admitting: Internal Medicine

## 2021-08-27 NOTE — Telephone Encounter (Signed)
Last filled 07-21-21 #30 Last OV 06-10-21 Next OV 06-16-22 [facility] [place]

## 2021-08-31 ENCOUNTER — Other Ambulatory Visit: Payer: Self-pay

## 2021-08-31 ENCOUNTER — Telehealth: Payer: Self-pay | Admitting: Pharmacist

## 2021-08-31 ENCOUNTER — Ambulatory Visit (INDEPENDENT_AMBULATORY_CARE_PROVIDER_SITE_OTHER): Payer: Medicare Other | Admitting: Pharmacist

## 2021-08-31 DIAGNOSIS — I5042 Chronic combined systolic (congestive) and diastolic (congestive) heart failure: Secondary | ICD-10-CM

## 2021-08-31 DIAGNOSIS — I10 Essential (primary) hypertension: Secondary | ICD-10-CM

## 2021-08-31 DIAGNOSIS — N1831 Chronic kidney disease, stage 3a: Secondary | ICD-10-CM

## 2021-08-31 NOTE — Progress Notes (Signed)
Chronic Care Management Pharmacy Note  08/31/2021 Name:  Leslie [name] MRN:  [id number] DOB:  08/25/1930  Summary: -Pt successfully enrolled in [employer] assistance program for Farxiga today, just needs Rx from prescriber Dr. Tamala Julian sent to mail order pharmacy -Pt has history of remote CVA (~2004?) and has never been on a statin. Last lipid panel 06/2017, LDL was 111. Given pt age and limited benefit of statin at this point, it is reasonable continue without statin  Recommendations/Changes made from today's visit: -Coordinate refill of Farxiga to [facility] pharmacy (AZ[employer]Me) w/ cardiologist -Consider updating Lipid panel with next PCP visit  Plan: -[place] will call [place] in 1 week to ensure shipment of Farxiga -Pharmacist follow up televisit scheduled for 5 months    Subjective: Leslie [name] is an 88 y.o. year old female who is a primary patient of [name], [name], MD.  The CCM team was consulted for assistance with disease management and care coordination needs.    Engaged with patient by telephone for initial visit in response to provider referral for pharmacy case management and/or care coordination services.   Consent to Services:  The patient was given the following information about Chronic Care Management services today, agreed to services, and gave verbal consent: 1. CCM service includes personalized support from designated clinical staff supervised by the primary care provider, including individualized plan of care and coordination with other care providers 2. 24/7 contact phone numbers for assistance for urgent and routine care needs. 3. Service will only be billed when office clinical staff spend 20 minutes or more in a month to coordinate care. 4. Only one practitioner may furnish and bill the service in a calendar month. 5.The patient may stop CCM services at any time (effective at the end of the month) by phone call to the office staff. 6. The patient will be  responsible for cost sharing (co-pay) of up to 20% of the service fee (after annual deductible is met). Patient agreed to services and consent obtained.  Patient Care Team: [name], MD as PCP - General (Internal Medicine) [name], MD as PCP - Cardiology (Cardiology) [name], [facility] as Pharmacist (Pharmacist)    Recent office visits: 06/10/21-PCP-Richard [name],MD-Patient presented for AWV.Discussed screenings,has gotten flu shot, vaccines discussed-no medication changes, follow up 1 year  04/20/21-PCP-Richard [name],MD-Patient presented for follow up from ER visit.Pain left clavicle area.No medication changes   Recent consult visits: 07/17/21-Cardiology-[name],MD-May need to increase Accupril or switch to Entresto depending on BP follow up. We decreased Toprol because of bradycardia less than 50.  Need to increase Accupril to at least 20 mg/day but will allow to gather additional data over the weekend and make final decision about increasing Accupril dose based upon the data that she gets from home. Follow up 1 year. --BP was normal at home, no action required.  Hospital visits: 04/15/21-[facility] presented for chest wall pain.Labs,EKG,Xrays ordered,normal findings-No admission   Objective:  Lab Results  Component Value Date   CREATININE 1.01 (H) 04/15/2021   BUN 19 04/15/2021   GFR 55.16 (L) 06/06/2020   GFRNONAA 54 (L) 04/15/2021   GFRAA 60 10/03/2020   NA 138 04/15/2021   K 4.3 04/15/2021   CALCIUM 8.7 (L) 04/15/2021   CO2 23 04/15/2021   GLUCOSE 91 04/15/2021    Lab Results  Component Value Date/Time   GFR 55.16 (L) 06/06/2020 12:46 PM   GFR 43.72 (L) 12/19/2019 10:41 AM  Last diabetic Eye exam: No results found for: HMDIABEYEEXA  Last diabetic Foot exam: No results found for: HMDIABFOOTEX   Lab Results  Component Value Date   CHOL 194 07/06/2017   HDL 56.20 07/06/2017   LDLCALC 111 (H)  07/06/2017   TRIG 131.0 07/06/2017   CHOLHDL 3 07/06/2017    Hepatic Function Latest Ref Rng & Units 04/15/2021 06/06/2020 06/06/2020  Total Protein 6.5 - 8.1 g/dL 6.1(L) - 6.5  Albumin 3.5 - 5.0 g/dL 3.3(L) 4.1 4.1  AST 15 - 41 U/L 16 - 13  ALT 0 - 44 U/L 10 - 7  Alk Phosphatase 38 - 126 U/L 39 - 52  Total Bilirubin 0.3 - 1.2 mg/dL 0.8 - 0.7  Bilirubin, Direct 0.0 - 0.3 mg/dL - - 0.1    Lab Results  Component Value Date/Time   TSH 6.28 (H) 06/06/2020 12:46 PM   TSH 2.39 06/05/2019 11:54 AM   FREET4 0.87 06/06/2020 12:46 PM   FREET4 0.99 12/19/2019 10:41 AM    CBC Latest Ref Rng & Units 04/15/2021 06/06/2020 12/19/2019  WBC 4.0 - 10.5 K/uL 5.4 5.3 5.4  Hemoglobin 12.0 - 15.0 g/dL 13.1 13.7 13.4  Hematocrit 36.0 - 46.0 % 42.2 41.8 40.3  Platelets 150 - 400 K/uL 173 195.0 226.0    Lab Results  Component Value Date/Time   VD25OH 17.49 (L) 06/06/2020 12:46 PM    Clinical ASCVD: Yes  - CVA The ASCVD Risk score (Arnett DK, et al., 2019) failed to calculate for the following reasons:   The 2019 ASCVD risk score is only valid for ages 77 to 34    Depression screen PHQ 2/9 06/10/2021 01/13/2018  Decreased Interest 0 0  Down, Depressed, Hopeless 0 0  PHQ - 2 Score 0 0     Social History   Tobacco Use  Smoking Status Never  Smokeless Tobacco Never   BP Readings from Last 3 Encounters:  07/30/21 136/73  07/17/21 (!) 172/72  06/10/21 134/86   Pulse Readings from Last 3 Encounters:  07/17/21 (!) 59  06/10/21 60  04/20/21 60   Wt Readings from Last 3 Encounters:  07/17/21 152 lb 3.2 oz (69 kg)  06/10/21 150 lb (68 kg)  04/20/21 148 lb (67.1 kg)   BMI Readings from Last 3 Encounters:  07/17/21 24.57 kg/m  06/10/21 24.21 kg/m  04/20/21 23.18 kg/m    Assessment/Interventions: Review of patient past medical history, allergies, medications, health status, including review of consultants reports, laboratory and other test data, was performed as part of comprehensive  evaluation and provision of chronic care management services.   SDOH:  (Social Determinants of Health) assessments and interventions performed: Yes   SDOH Screenings   Alcohol Screen: Not on file  Depression (PHQ2-9): Low Risk    PHQ-2 Score: 0  Financial Resource Strain: Low Risk    Difficulty of Paying Living Expenses: Not hard at all  Food Insecurity: Not on file  Housing: Not on file  Physical Activity: Not on file  Social Connections: Not on file  Stress: Not on file  Tobacco Use: Low Risk    Smoking Tobacco Use: Never   Smokeless Tobacco Use: Never   Passive Exposure: Not on file  Transportation Needs: Not on file    CCM Care Plan  Allergies  Allergen Reactions   Sulfa Antibiotics Other (See Comments)    "urine crystallizes"   Cephalexin Diarrhea   Dilaudid [Hydromorphone Hcl]         Xopenex [Levalbuterol Hcl]  Other (See Comments)    "gives me the shakes"    Medications Reviewed Today     Reviewed by [name], [facility] (Pharmacist) on 08/31/21 at 1455  Med List Status: <None>   Medication Order Taking? Sig Documenting Provider Last Dose Status Informant  BD INTEGRA SYRINGE 25G X 1" 3 ML MISC [id number] Yes USE 1 EVERY 30 (THIRTY) DAYS. WITH VITAMINE B 12 INJECTION [name], MD Taking Active Self  Calcium Carbonate-Vit D-Min (CALCIUM 1200 PO) [id number] Yes Take 1 tablet by mouth daily. [provider] Taking Active Self  citalopram (CELEXA) 20 MG tablet [id number] Yes TAKE 1 TABLET BY MOUTH EVERY DAY [name], MD Taking Active   clopidogrel (PLAVIX) 75 MG tablet [id number] Yes TAKE 1 TABLET BY MOUTH EVERY DAY  Patient taking differently: Take 75 mg by mouth daily.   [name], MD Taking Active   cyanocobalamin (,VITAMIN B-12,) 1000 MCG/ML injection [id number] Yes INJECT 1 ML (1,000 MCG TOTAL) INTO THE MUSCLE EVERY 30 (THIRTY) DAYS. [name], MD Taking Active Self  dapagliflozin propanediol (FARXIGA) 10 MG TABS  tablet [id number] Yes Take 1 tablet (10 mg total) by mouth daily before breakfast. [name], MD Taking Active Self  levothyroxine (SYNTHROID) 75 MCG tablet [id number] Yes TAKE 1 TABLET BY MOUTH EVERY DAY [name], MD Taking Active   LORazepam (ATIVAN) 1 MG tablet [id number] Yes TAKE 1/2 TO 1 TABLET BY MOUTH AT BEDTIME AS NEEDED [name], MD Taking Active   metoprolol succinate (TOPROL XL) 25 MG 24 hr tablet [id number] Yes Take 0.5 tablets (12.5 mg total) by mouth daily. [name], MD Taking Active Self  NEEDLE, DISP, 25 G (B-D DISP NEEDLE 25GX1") 25G X 1" [place] [id number] Yes 1 each by Does not apply route every 30 (thirty) days. [name], MD Taking Active Self  Polyvinyl Alcohol-Povidone [facility] OP) [id number] Yes Place 1 drop into both eyes in the morning, at noon, in the evening, and at bedtime. [provider] Taking Active Self  quinapril (ACCUPRIL) 10 MG tablet [id number] Yes TAKE 1 TABLET BY MOUTH EVERY DAY  Patient taking differently: Take 10 mg by mouth daily.   [name], MD Taking Active             Patient Active Problem List   Diagnosis Date Noted   Acquired thrombophilia ([place]) 06/05/2019   Preventative health care 01/13/2018   Carotid artery disease ([place]) 01/13/2018   Stage 3b chronic kidney disease ([place]) 01/13/2018   Advance directive discussed with patient 01/13/2018   GERD (gastroesophageal reflux disease) 07/06/2017   Mood disorder ([place]) 07/06/2017   Vitamin B12 deficiency 07/06/2017   History of CVA (cerebrovascular accident) 07/06/2017   LBBB (left bundle branch block) 06/16/2014   Chronic combined systolic and diastolic heart failure ([place]) 06/16/2014   Hypothyroidism 05/25/2012   HTN (hypertension) 05/25/2012    Immunization History  Administered Date(s) Administered   Fluad Quad(high Dose 65+) 06/05/2019, 06/06/2020, 04/20/2021   Influenza,inj,Quad PF,6+ Mos 05/10/2018   Influenza-Unspecified 05/09/2017    PFIZER(Purple Top)SARS-COV-2 Vaccination 08/30/2019, 09/20/2019, 08/18/2020   Pfizer Covid-19 Vaccine Bivalent Booster 51yr & up 06/24/2021   Pneumococcal Conjugate-13 02/04/2014   Pneumococcal Polysaccharide-23 08/10/2007, 01/13/2018   Td 08/10/2007   Tdap 07/02/2020   Zoster, Live 02/22/2013    Conditions to be addressed/monitored:  Hypertension, Hyperlipidemia, Heart Failure, Chronic Kidney Disease, Depression, and Anxiety  Care Plan : [place] Updates [facility] by [facility]  Cleaster Corin, RPH since 08/31/2021 12:00 AM     Problem: Hypertension, Hyperlipidemia, Heart Failure, Chronic Kidney Disease, Depression, and Anxiety   Priority: High     Long-Range Goal: Disease mgmt   Start Date: 07/30/2021  Expected End Date: 07/30/2022  Recent Progress: On track  Priority: High  Note:   Current Barriers:  Unable to independently afford treatment regimen Unable to independently monitor therapeutic efficacy  Pharmacist Clinical Goal(s):  Patient will verbalize ability to afford treatment regimen achieve adherence to monitoring guidelines and medication adherence to achieve therapeutic efficacy through collaboration with PharmD and provider.   Interventions: 1:1 collaboration with [name], MD regarding development and update of comprehensive plan of care as evidenced by provider attestation and co-signature Inter-disciplinary care team collaboration (see longitudinal plan of care) Comprehensive medication review performed; medication list updated in electronic medical record  Hypertension / Heart Failure / CKD Stage 3(BP goal <140/90) -Controlled -Last ejection fraction: 30-35% (Date: 06/27/19) -HF type: Combined Systolic and Diastolic -Current home BP/HR readings: 136/73 -Current treatment: Metoprolol succinate 25 mg - 1/2 tab daily Quinapril 10 mg daily Farxiga 10 mg daily  -Denies hypotensive/hypertensive symptoms -Educated on BP goals and benefits of  medications for prevention of heart attack, stroke and kidney damage; -Educated on Benefits of medications for managing symptoms and prolonging life -Counseled to monitor BP at home periodically -Assessed pt finances - enrolled patient  in [place] (AZ&Me) pt assistance, will coordinate with cardiologist for Rx to mail order pharmacy -Recommended to continue current medication  Hyperlipidemia / hx CVA (LDL goal < 70)  -not addressed 08/31/21 -Unable to assess control, last lipid panel was 06/2017 and LDL was 111; pt endorses compliance with clopidogrel -Never on statin -Current treatment: Clopidogrel 75 mg daily -Educated on Cholesterol goals; Importance of limiting foods high in cholesterol; -Given patient age and risk vs. benefit of statin initiation, it is reasonable to continue without statin -Recommended to continue current medication  Mood disorder (Goal: manage symptoms) - not addressed 08/31/21 -Controlled - pt report she was put on citalopram after her husband passed, she did not have what she considers depression; she would like to avoid making changes at her age and will continue medication; she reports she can't sleep without lorazepam -Current treatment: Citalopram 20 mg daily Lorazepam 1 mg - 1/2 to 1 tab HS prn -Pt is fairly dependent on lorazepam for sleep however she has been on it many years, she is not abusing it or at risk for overdose so it is reasonable to continue -Recommended to continue current medication  Health Maintenance -Vaccine gaps: Shingrix -Hx CKD stage 3a - BP under control, does not take NSAIDs -Current therapy:  Vitamin B12 injection q30 days (at home - 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 Prescriber Dr Tamala Julian  Compliance/Adherence/Medication fill history: Care Gaps: DEXA (never done)  Star-Rating Drugs: Quinapril 10 mg - LF 06/13/21 x 90 ds; PDC 100% Farxiga 10 mg - LF 1/6//23 x 30 ds; [place] 100%  Patient's preferred pharmacy is:  [id number] [place]  Phone: [phone] Fax: [place], [place]. [place] [address] Phone: [phone] Fax: [phone]  Uses pill box? Yes Pt endorses 100% compliance  We discussed: Current pharmacy is preferred with insurance plan and patient is satisfied with pharmacy services Patient decided to: Continue current medication management strategy  Care Plan and Follow Up Patient Decision:  Patient agrees to Care Plan and Follow-up.  Plan: Telephone follow up appointment with care management team member scheduled for:  5 months  [name], PharmD, BCACP Clinical Pharmacist [facility] at [facility] [phone]

## 2021-08-31 NOTE — Patient Instructions (Signed)
Visit Information  Phone number for Pharmacist: [phone]   Goals Addressed   None     Care Plan : [place]  Updates made by [name], RPH since 08/31/2021 12:00 AM     Problem: Hypertension, Hyperlipidemia, Heart Failure, Chronic Kidney Disease, Depression, and Anxiety   Priority: High     Long-Range Goal: Disease mgmt   Start Date: 07/30/2021  Expected End Date: 07/30/2022  Recent Progress: On track  Priority: High  Note:   Current Barriers:  Unable to independently afford treatment regimen Unable to independently monitor therapeutic efficacy  Pharmacist Clinical Goal(s):  Patient will verbalize ability to afford treatment regimen achieve adherence to monitoring guidelines and medication adherence to achieve therapeutic efficacy through collaboration with PharmD and provider.   Interventions: 1:1 collaboration with [name], MD regarding development and update of comprehensive plan of care as evidenced by provider attestation and co-signature Inter-disciplinary care team collaboration (see longitudinal plan of care) Comprehensive medication review performed; medication list updated in electronic medical record  Hypertension / Heart Failure / CKD Stage 3(BP goal <140/90) -Controlled -Last ejection fraction: 30-35% (Date: 06/27/19) -HF type: Combined Systolic and Diastolic -Current home BP/HR readings: 136/73 -Current treatment: Metoprolol succinate 25 mg - 1/2 tab daily Quinapril 10 mg daily Farxiga 10 mg daily  -Denies hypotensive/hypertensive symptoms -Educated on BP goals and benefits of medications for prevention of heart attack, stroke and kidney damage; -Educated on Benefits of medications for managing symptoms and prolonging life -Counseled to monitor BP at home periodically -Assessed pt finances - enrolled patient  in [place] (AZ&Me) pt assistance, will coordinate with cardiologist for Rx to mail order  pharmacy -Recommended to continue current medication  Hyperlipidemia / hx CVA (LDL goal < 70)  -not addressed 08/31/21 -Unable to assess control, last lipid panel was 06/2017 and LDL was 111; pt endorses compliance with clopidogrel -Never on statin -Current treatment: Clopidogrel 75 mg daily -Educated on Cholesterol goals; Importance of limiting foods high in cholesterol; -Given patient age and risk vs. benefit of statin initiation, it is reasonable to continue without statin -Recommended to continue current medication  Mood disorder (Goal: manage symptoms) - not addressed 08/31/21 -Controlled - pt report she was put on citalopram after her husband passed, she did not have what she considers depression; she would like to avoid making changes at her age and will continue medication; she reports she can't sleep without lorazepam -Current treatment: Citalopram 20 mg daily Lorazepam 1 mg - 1/2 to 1 tab HS prn -Pt is fairly dependent on lorazepam for sleep however she has been on it many years, she is not abusing it or at risk for overdose so it is reasonable to continue -Recommended to continue current medication  Health Maintenance -Vaccine gaps: Shingrix -Hx CKD stage 3a - BP under control, does not take NSAIDs -Current therapy:  Vitamin B12 injection q30 days (at home - 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) Calcium-Vitamin D Refresh eye drops -Patient is satisfied with current therapy and denies issues -Recommended to continue current medication  Patient Goals/Self-Care Activities Patient will:  - take medications as prescribed as evidenced by patient report and record review focus on medication adherence by routine check blood pressure periodically, document, and provide at future appointments       The patient verbalized understanding of instructions, educational materials, and care plan provided today and declined offer to receive copy of patient instructions, educational materials, and care plan.   Telephone follow up appointment with pharmacy team member  scheduled for: 5 months  [name], PharmD, [facility] Clinical Pharmacist [place] [facility] at [facility] [phone]

## 2021-08-31 NOTE — Telephone Encounter (Signed)
Patient was successfully enrolled online in AZ&Me patient assistance program for Farxiga 10 mg tablets. They require a new prescription from prescriber Dr. Tamala Julian be e-scribed or faxed to their mail order pharmacy:  [place], [place]. [place] [address] Phone: [phone] Fax: [phone]  Forwarding request to prescriber.

## 2021-09-01 MED ORDER — DAPAGLIFLOZIN PROPANEDIOL 10 MG PO TABS: 10.0000 mg | ORAL_TABLET | Freq: Every day | ORAL | 3 refills | Status: DC

## 2021-09-01 NOTE — Addendum Note (Signed)
**Note De-Identified Lennette Fader Obfuscation** Addended by: Dennie Fetters on: 09/01/2021 09:17 AM   Modules accepted: Orders

## 2021-09-01 NOTE — Telephone Encounter (Signed)
**Note De-Identified Tadhg Eskew Obfuscation** I have e-scribed the pts Farxiga 10 mg #90 with 3 refills to: [facility] - [place], [place] [address] [place] [address] Phone: [phone] Fax: [phone]  Per the pts chart the RX was e-scribed successfully as follows: dapagliflozin propanediol (FARXIGA) 10 MG TABS tablet 90 tablet 3 09/01/2021    Sig - Route: Take 1 tablet (10 mg total) by mouth daily before breakfast. - Oral   Sent to pharmacy as: dapagliflozin propanediol (FARXIGA) 10 MG Tab tablet   E-Prescribing Status: Receipt confirmed by pharmacy (09/01/2021  9:14 AM EST)             [facility] - [place], [place] - [address]

## 2021-09-02 ENCOUNTER — Telehealth: Payer: Self-pay

## 2021-09-02 NOTE — Chronic Care Management (AMB) (Signed)
Contacted AZ & Me to check on status of enrollment for the medication Farxiga.Additional information was needed. Pharmacy was awaiting prescription from specialist and this has been received . Order placed and due to shipments being behind , there is a 10-14 day estimate for delivery. The patient has auto refill status and will get 90DS per shipment to her home. Patient notified.   [place]  CPP notified  Avel Sensor, [place] Assistant [phone]  Total time spent for month CPA: 30 min.

## 2021-09-05 ENCOUNTER — Encounter (HOSPITAL_BASED_OUTPATIENT_CLINIC_OR_DEPARTMENT_OTHER): Payer: Self-pay

## 2021-09-05 ENCOUNTER — Other Ambulatory Visit: Payer: Self-pay

## 2021-09-05 ENCOUNTER — Emergency Department (HOSPITAL_BASED_OUTPATIENT_CLINIC_OR_DEPARTMENT_OTHER)
Admission: EM | Admit: 2021-09-05 | Discharge: 2021-09-05 | Disposition: A | Discharge status: Home / Self Care | Payer: Medicare Other | Attending: Emergency Medicine | Admitting: Emergency Medicine

## 2021-09-05 DIAGNOSIS — Z7901 Long term (current) use of anticoagulants: Secondary | ICD-10-CM | POA: Insufficient documentation

## 2021-09-05 DIAGNOSIS — W540XXA Bitten by dog, initial encounter: Secondary | ICD-10-CM | POA: Diagnosis not present

## 2021-09-05 DIAGNOSIS — S51811A Laceration without foreign body of right forearm, initial encounter: Secondary | ICD-10-CM | POA: Diagnosis not present

## 2021-09-05 DIAGNOSIS — S59911A Unspecified injury of right forearm, initial encounter: Secondary | ICD-10-CM | POA: Diagnosis present

## 2021-09-05 DIAGNOSIS — S51851A Open bite of right forearm, initial encounter: Secondary | ICD-10-CM | POA: Diagnosis not present

## 2021-09-05 MED ORDER — AMOXICILLIN-POT CLAVULANATE 875-125 MG PO TABS: 1.0000 | ORAL_TABLET | Freq: Two times a day (BID) | ORAL | 0 refills | Status: DC

## 2021-09-05 NOTE — ED Notes (Signed)
Dc instructions reviewed with patient. Patient voiced understanding. Dc with belongings.  °

## 2021-09-05 NOTE — ED Triage Notes (Signed)
Pt's family dog bite pt on R arm through a coat. Pt reports the dog is vaccinated. Injury occurred last PM. Wound is bandaged PTA.

## 2021-09-05 NOTE — ED Notes (Signed)
Right posterior forearm skin tear with wound bed covered with bacitracin and mepilex. Wound care discussed with patient

## 2021-09-05 NOTE — ED Provider Notes (Signed)
[facility] EMERGENCY DEPT Provider Note   CSN: [id number] Arrival date & time: 09/05/21  1234     History Chief Complaint  Patient presents with   Animal Bite         Leslie [name] is a 88 y.o. female who presents to the emergency department with a skin tear secondary to a dog bite that occurred just prior to arrival.  Patient states she was holding the dog in her lap by the collar when she turned around and tried to bite her.  Patient was wearing a longsleeve jacket and did not get any puncture wounds however did sustain skin tears from the bite.  Patient is anticoagulated on Plavix.  Bleeding is controlled.  No other injuries.   Animal Bite     Home Medications Prior to Admission medications   Medication Sig Start Date End Date Taking? Authorizing Provider  amoxicillin-clavulanate (AUGMENTIN) 875-125 MG tablet Take 1 tablet by mouth every 12 (twelve) hours. 09/05/21  Yes Lakyn Mantione M, PA-C  BD INTEGRA SYRINGE 25G X 1" 3 ML MISC USE 1 EVERY 30 (THIRTY) DAYS. WITH VITAMINE B 12 INJECTION 10/15/19   [name], MD  Calcium Carbonate-Vit D-Min (CALCIUM 1200 PO) Take 1 tablet by mouth daily.    [provider]  citalopram (CELEXA) 20 MG tablet TAKE 1 TABLET BY MOUTH EVERY DAY 08/27/21   Viviana Simpler I, MD  clopidogrel (PLAVIX) 75 MG tablet TAKE 1 TABLET BY MOUTH EVERY DAY Patient taking differently: Take 75 mg by mouth daily. 10/20/20   [name], MD  cyanocobalamin (,VITAMIN B-12,) 1000 MCG/ML injection INJECT 1 ML (1,000 MCG TOTAL) INTO THE MUSCLE EVERY 30 (THIRTY) DAYS. 09/29/20   [name], MD  dapagliflozin propanediol (FARXIGA) 10 MG TABS tablet Take 1 tablet (10 mg total) by mouth daily before breakfast. 09/01/21   [name], MD  levothyroxine (SYNTHROID) 75 MCG tablet TAKE 1 TABLET BY MOUTH EVERY DAY 06/17/21   [name], MD  LORazepam (ATIVAN) 1 MG tablet TAKE 1/2 TO 1 TABLET BY MOUTH AT BEDTIME AS NEEDED 08/27/21    [name], MD  metoprolol succinate (TOPROL XL) 25 MG 24 hr tablet Take 0.5 tablets (12.5 mg total) by mouth daily. 09/15/20   [name], MD  NEEDLE, DISP, 25 G (B-D DISP NEEDLE 25GX1") 25G X 1" MISC 1 each by Does not apply route every 30 (thirty) days. 10/31/18   [name], MD  Polyvinyl Alcohol-Povidone (REFRESH OP) Place 1 drop into both eyes in the morning, at noon, in the evening, and at bedtime.    [provider]  quinapril (ACCUPRIL) 10 MG tablet TAKE 1 TABLET BY MOUTH EVERY DAY Patient taking differently: Take 10 mg by mouth daily. 11/10/20   [name], MD      Allergies    Sulfa antibiotics, Cephalexin, Dilaudid [hydromorphone hcl], and Xopenex [levalbuterol hcl]    Review of Systems   Review of Systems  All other systems reviewed and are negative.  Physical Exam Updated Vital Signs BP (!) 162/65 (BP Location: Left Arm)    Pulse (!) 59    Temp 98.3 F (36.8 C) (Oral)    Resp 16    Ht 5\' 6"  (1.676 m)    Wt 65.8 kg    SpO2 99%    BMI 23.40 kg/m  Physical Exam Vitals and nursing note reviewed.  Constitutional:      Appearance: Normal appearance.  HENT:  Head: Normocephalic and atraumatic.  Eyes:     General:        Right eye: No discharge.        Left eye: No discharge.     Conjunctiva/sclera: Conjunctivae normal.  Pulmonary:     Effort: Pulmonary effort is normal.  Skin:    General: Skin is warm and dry.     Findings: No rash.     Comments: Multiple skin tears over the right forearm.  Bleeding is controlled.  No evidence of overlying fat or muscle.  Neurological:     General: No focal deficit present.     Mental Status: She is alert.  Psychiatric:        Mood and Affect: Mood normal.        Behavior: Behavior normal.    ED Results / Procedures / Treatments   Labs (all labs ordered are listed, but only abnormal results are displayed) Labs Reviewed - No data to display  EKG None  Radiology No results  found.  Procedures Procedures    Medications Ordered in ED Medications - No data to display  ED Course/ Medical Decision Making/ A&P                           Medical Decision Making  Leslie [name] is a 88 y.o. female who is anticoagulated on Plavix who presents to the emergency department with skin tear secondary to dog bite that occurred just prior to arrival.  Overall there are large chunks of epidermis missing.  Her skin is very thin and frail.  Concerning the mechanism of injury and her overall skin integrity I do not feel that sutures would beneficial at this time.  We will can apply some bacitracin ointment and keep it wrapped.  I discussed appropriate wound care with the patient and to the member at bedside.  They expressed full understanding.  I told patient and family that this will take a while to heal concerned that she is on Plavix.  Told him not to submerge wound in water.  Keep it wrapped when she is outside and she may leave it open when she is indoors. They can place Neosporin over the area.  I discussed strict return precautions.  Given mechanism I will write her a prescription for Augmentin. Tdap is up to date.  Wound was irrigated extensively at the bedside.  Final Clinical Impression(s) / ED Diagnoses Final diagnoses:  Dog bite, initial encounter    Rx / DC Orders ED Discharge Orders          Ordered    amoxicillin-clavulanate (AUGMENTIN) 875-125 MG tablet  Every 12 hours        09/05/21 1455              Myna Bright [place], [place] 41/93/79 0240    Lianne Cure, DO 97/35/32 2111

## 2021-09-05 NOTE — Discharge Instructions (Addendum)
Please keep area clean and dry.  Apply Neosporin before you put on the dressing to make sure it does not stick.  Please keep it covered when you are outside.  You may leave it open with Neosporin on it when you are indoors.  Please take antibiotics as prescribed.  Please follow-up with your primary care doctor.  Return to the emergency department for worsening symptoms.

## 2021-09-07 ENCOUNTER — Telehealth: Payer: Self-pay

## 2021-09-07 NOTE — Telephone Encounter (Signed)
Spoke to pt. She said the site is not red or inflamed. She may stop taking the Augmentin or she may try it on a full stomach. She will let us know if she needs to change the antibiotic.

## 2021-09-07 NOTE — Progress Notes (Signed)
Entered in Error

## 2021-09-07 NOTE — Telephone Encounter (Signed)
Called to see how she was doing ok. She has taken 3 doses of the Augmentin and is having diarrhea. I explained that is a common side effect with that medication bt would forward the message to Dr Silvio Pate for guidance. She uses [facility] if the antibiotic needs to be changed.

## 2021-09-08 DIAGNOSIS — I110 Hypertensive heart disease with heart failure: Secondary | ICD-10-CM

## 2021-09-08 DIAGNOSIS — E785 Hyperlipidemia, unspecified: Secondary | ICD-10-CM | POA: Diagnosis not present

## 2021-09-08 DIAGNOSIS — N1831 Chronic kidney disease, stage 3a: Secondary | ICD-10-CM | POA: Diagnosis not present

## 2021-09-08 DIAGNOSIS — I5042 Chronic combined systolic (congestive) and diastolic (congestive) heart failure: Secondary | ICD-10-CM | POA: Diagnosis not present

## 2021-09-17 ENCOUNTER — Telehealth: Payer: Self-pay

## 2021-09-17 NOTE — Progress Notes (Signed)
° ° °  Chronic Care Management Pharmacy Assistant   Name: Leslie [name]  MRN: [id number] DOB: 1930/10/02  Reason for Encounter: CCM Wilder Glade 2023 Approved)   Called [place] [employer] [place] to check on status of patient's Farxiga patient assistance application for [id number]. Patient has been approved through 08/08/2022. I called patient to inform her; no answer; left message.  [name], CPP notified  Marijean Niemann, [place] Clinical Pharmacy Assistant [phone]  Time Spent: [address]

## 2021-09-17 NOTE — Progress Notes (Signed)
Please see encounter dated 09/17/2021

## 2021-09-28 ENCOUNTER — Other Ambulatory Visit: Payer: Self-pay | Admitting: Internal Medicine

## 2021-09-28 NOTE — Telephone Encounter (Signed)
Last filled 08-27-21 #30 Last OV 06-10-21 Next OV 06-16-22 [facility]

## 2021-10-15 DIAGNOSIS — H532 Diplopia: Secondary | ICD-10-CM | POA: Diagnosis not present

## 2021-10-15 DIAGNOSIS — H5022 Vertical strabismus, left eye: Secondary | ICD-10-CM | POA: Diagnosis not present

## 2021-10-15 DIAGNOSIS — H50312 Intermittent monocular esotropia, left eye: Secondary | ICD-10-CM | POA: Diagnosis not present

## 2021-10-25 ENCOUNTER — Other Ambulatory Visit: Payer: Self-pay | Admitting: Internal Medicine

## 2021-10-26 NOTE — Telephone Encounter (Signed)
Last PCP apt 06/10/21. Next apt 06/16/22. ?Sent in refills.  ?

## 2021-10-27 ENCOUNTER — Other Ambulatory Visit: Payer: Self-pay | Admitting: Internal Medicine

## 2021-10-27 ENCOUNTER — Other Ambulatory Visit: Payer: Self-pay | Admitting: Interventional Cardiology

## 2021-10-27 NOTE — Telephone Encounter (Signed)
Last filled 09-28-21 #30 ?Last OV 06-10-21 ?Next OV 06-16-22 ?[facility] ?

## 2021-11-16 DIAGNOSIS — L821 Other seborrheic keratosis: Secondary | ICD-10-CM | POA: Diagnosis not present

## 2021-11-16 DIAGNOSIS — H11121 Conjunctival concretions, right eye: Secondary | ICD-10-CM | POA: Diagnosis not present

## 2021-11-16 DIAGNOSIS — H02831 Dermatochalasis of right upper eyelid: Secondary | ICD-10-CM | POA: Diagnosis not present

## 2021-11-16 DIAGNOSIS — H57813 Brow ptosis, bilateral: Secondary | ICD-10-CM | POA: Diagnosis not present

## 2021-11-16 DIAGNOSIS — D485 Neoplasm of uncertain behavior of skin: Secondary | ICD-10-CM | POA: Diagnosis not present

## 2021-11-16 DIAGNOSIS — H019 Unspecified inflammation of eyelid: Secondary | ICD-10-CM | POA: Diagnosis not present

## 2021-11-16 DIAGNOSIS — H02413 Mechanical ptosis of bilateral eyelids: Secondary | ICD-10-CM | POA: Diagnosis not present

## 2021-11-16 DIAGNOSIS — H02834 Dermatochalasis of left upper eyelid: Secondary | ICD-10-CM | POA: Diagnosis not present

## 2021-11-23 ENCOUNTER — Telehealth: Payer: Self-pay

## 2021-11-23 NOTE — Telephone Encounter (Signed)
[facility] now [facility] in [facility] for patient , they getting this  ready for her to put up , called and left detailed voicemail advising pt of this. ?

## 2021-11-23 NOTE — Telephone Encounter (Signed)
Patient called stating that she has not been taking Quinapril in the last 2 weeks due to [facility] (she is not sure if its just the dose). She also called [facility] and they do not have this medication either. She would like help on how to proceed. ? ? ?

## 2021-12-04 ENCOUNTER — Other Ambulatory Visit: Payer: Self-pay | Admitting: Internal Medicine

## 2021-12-04 NOTE — Telephone Encounter (Signed)
Last filled 11-01-21 #30 ?Last OV 06-10-21 ?Next OV 06-16-22 ?[facility] ?

## 2021-12-11 DIAGNOSIS — D23122 Other benign neoplasm of skin of left lower eyelid, including canthus: Secondary | ICD-10-CM | POA: Diagnosis not present

## 2021-12-11 DIAGNOSIS — D485 Neoplasm of uncertain behavior of skin: Secondary | ICD-10-CM | POA: Diagnosis not present

## 2021-12-11 DIAGNOSIS — I781 Nevus, non-neoplastic: Secondary | ICD-10-CM | POA: Diagnosis not present

## 2021-12-30 DIAGNOSIS — H5021 Vertical strabismus, right eye: Secondary | ICD-10-CM | POA: Diagnosis not present

## 2021-12-30 DIAGNOSIS — H532 Diplopia: Secondary | ICD-10-CM | POA: Diagnosis not present

## 2021-12-30 DIAGNOSIS — H5005 Alternating esotropia: Secondary | ICD-10-CM | POA: Diagnosis not present

## 2022-01-05 ENCOUNTER — Other Ambulatory Visit: Payer: Self-pay | Admitting: Internal Medicine

## 2022-01-05 NOTE — Telephone Encounter (Signed)
Last filled 12-04-21 #30 Last OV 06-10-21 Next OV 06-16-22 [facility]

## 2022-01-21 ENCOUNTER — Telehealth: Payer: Self-pay

## 2022-01-21 NOTE — Chronic Care Management (AMB) (Signed)
    Chronic Care Management Pharmacy Assistant   Name: Leslie [name]  MRN: [id number] DOB: 1930-09-21   Reason for Encounter: Reminder Call   Conditions to be addressed/monitored: CAD, HTN, and CKD Stage 3b   Medications: Outpatient Encounter Medications as of 01/21/2022  Medication Sig   amoxicillin-clavulanate (AUGMENTIN) 875-125 MG tablet Take 1 tablet by mouth every 12 (twelve) hours.   BD INTEGRA SYRINGE 25G X 1" 3 ML MISC USE 1 EVERY 30 (THIRTY) DAYS. WITH VITAMINE B 12 INJECTION   Calcium Carbonate-Vit D-Min (CALCIUM 1200 PO) Take 1 tablet by mouth daily.   citalopram (CELEXA) 20 MG tablet TAKE 1 TABLET BY MOUTH EVERY DAY   clopidogrel (PLAVIX) 75 MG tablet TAKE 1 TABLET BY MOUTH EVERY DAY   dapagliflozin propanediol (FARXIGA) 10 MG TABS tablet Take 1 tablet (10 mg total) by mouth daily before breakfast.   DODEX 1000 MCG/ML injection INJECT 1 ML (1,000 MCG TOTAL) INTO THE MUSCLE EVERY 30 DAYS.   levothyroxine (SYNTHROID) 75 MCG tablet TAKE 1 TABLET BY MOUTH EVERY DAY   LORazepam (ATIVAN) 1 MG tablet TAKE 1/2 TO 1 TABLET BY MOUTH AT BEDTIME AS NEEDED   metoprolol succinate (TOPROL-XL) 25 MG 24 hr tablet TAKE 1/2 TABLET BY MOUTH EVERY DAY   NEEDLE, DISP, 25 G (B-D DISP NEEDLE 25GX1") 25G X 1" MISC 1 each by Does not apply route every 30 (thirty) days.   Polyvinyl Alcohol-Povidone (REFRESH OP) Place 1 drop into both eyes in the morning, at noon, in the evening, and at bedtime.   quinapril (ACCUPRIL) 10 MG tablet TAKE 1 TABLET BY MOUTH EVERY DAY (Patient taking differently: Take 10 mg by mouth daily.)   No facility-administered encounter medications on file as of 01/21/2022.   Leslie [name] was contacted to remind of upcoming telephone visit with Leslie [name] on 01/26/22 at 11. Patient was reminded to have any blood glucose and blood pressure readings available for review at appointment.   Message was left reminding patient of appointment.  CCM referral has been placed prior  to visit?  No    Star Rating Drugs: Medication:  Last Fill: Day Supply Farxiga '10mg'$   09/11/21  30 Manufacturer- not verified   Leslie [name], CPP notified  Avel Sensor, [place]  [phone]

## 2022-01-26 ENCOUNTER — Ambulatory Visit: Payer: Medicare Other | Admitting: Pharmacist

## 2022-01-26 VITALS — BP 126/66 | Wt 148.0 lb

## 2022-01-26 DIAGNOSIS — I10 Essential (primary) hypertension: Secondary | ICD-10-CM

## 2022-01-26 DIAGNOSIS — Z8673 Personal history of transient ischemic attack (TIA), and cerebral infarction without residual deficits: Secondary | ICD-10-CM

## 2022-01-26 DIAGNOSIS — E039 Hypothyroidism, unspecified: Secondary | ICD-10-CM

## 2022-01-26 DIAGNOSIS — E538 Deficiency of other specified B group vitamins: Secondary | ICD-10-CM

## 2022-01-26 DIAGNOSIS — N1831 Chronic kidney disease, stage 3a: Secondary | ICD-10-CM

## 2022-01-26 DIAGNOSIS — F39 Unspecified mood [affective] disorder: Secondary | ICD-10-CM

## 2022-01-26 DIAGNOSIS — I5042 Chronic combined systolic (congestive) and diastolic (congestive) heart failure: Secondary | ICD-10-CM

## 2022-01-26 MED ORDER — "BD INTEGRA SYRINGE 25G X 1"" 3 ML MISC": 3 refills | Status: DC

## 2022-01-26 NOTE — Patient Instructions (Signed)
Visit Information  Phone number for Pharmacist: [phone]   Goals Addressed   None     Care Plan : [place]  Updates made by [name], [place] since 01/26/2022 12:00 AM     Problem: Hypertension, Hyperlipidemia, Heart Failure, Chronic Kidney Disease, Depression, and Anxiety   Priority: High     Long-Range Goal: Disease mgmt   Start Date: 07/30/2021  Expected End Date: 07/30/2022  Recent Progress: On track  Priority: High  Note:   Current Barriers:  None identified  Pharmacist Clinical Goal(s):  Patient will contact provider office for questions/concerns as evidenced notation of same in electronic health record through collaboration with PharmD and provider.   Interventions: 1:1 collaboration with [name], MD regarding development and update of comprehensive plan of care as evidenced by provider attestation and co-signature Inter-disciplinary care team collaboration (see longitudinal plan of care) Comprehensive medication review performed; medication list updated in electronic medical record  Hypertension / Heart Failure / CKD Stage 3(BP goal <140/90) -Controlled - pt affirms compliance with medications as below; she receives [place] through PAP -Last ejection fraction: 30-35% (Date: 06/27/19) -HF type: Combined Systolic and Diastolic -Current home BP/HR readings: 119/57, 143/63, 144/69, 126/66, 150/80 -Weight: 148 lb, she is not weighing on daily basis but reports weight is stable -Current treatment: Metoprolol succinate 25 mg - 1/2 tab daily - Appropriate, Effective, Safe, Accessible Quinapril 10 mg daily -Appropriate, Effective, Safe, Accessible Farxiga 10 mg daily -Appropriate, Effective, Safe, Accessible -Denies hypotensive/hypertensive symptoms -Educated on BP goals and benefits of medications for prevention of heart attack, stroke and kidney damage; -Educated on Benefits of medications for managing symptoms and prolonging  life -Counseled to monitor BP at home periodically -Recommended to continue current medication  Hyperlipidemia / hx CVA (LDL goal < 70) -Query controlled - last lipid panel was 06/2017 and LDL was 111; pt endorses compliance with clopidogrel;  -She has never been on a statin; Given patient age and risk vs. benefit of statin initiation, it is reasonable to continue without statin -Current treatment: Clopidogrel 75 mg daily -Appropriate, Effective, Safe, Accessible -Educated on Cholesterol goals; Importance of limiting foods high in cholesterol; -Recommended to continue current medication  Mood disorder (Goal: manage symptoms)  -Controlled - pt report she was put on citalopram after her husband passed, she did not have what she considers depression; she would like to avoid making changes at her age and will continue medication; she reports she can't sleep without lorazepam -Pt endorses low appetite today, but she does eat 3 meals a day, just less snacking and smaller meals -Current treatment: Citalopram 20 mg daily -Appropriate, Effective, Safe, Accessible Lorazepam 1 mg - 1/2 to 1 tab HS prn -Appropriate, Effective, Safe, Accessible -Pt is fairly dependent on lorazepam for sleep however she has been on it many years, she is not abusing it or at risk for overdose so it is reasonable to continue -Recommended to continue current medication  Chronic Kidney Disease Stage 3a  -All medications assessed for renal dosing and appropriateness in chronic kidney disease. -BP at goal; pt denies NSAID use -Recommended to continue current medication  Health Maintenance -Vaccine gaps: Shingrix -Current therapy:  B12 injection monthly (self-injection) Calcium-Vitamin D -Patient is satisfied with current therapy and denies issues -Recommended to continue current medication  Patient Goals/Self-Care Activities Patient will:  - take medications as prescribed as evidenced by patient report and record  review focus on medication adherence by routine check blood pressure periodically, document, and  provide at future appointments       Patient verbalizes understanding of instructions and care plan provided today and agrees to view in [place]. Active MyChart status and patient understanding of how to access instructions and care plan via MyChart confirmed with patient.    Telephone follow up appointment with pharmacy team member scheduled for: 6 months  [name], PharmD, [facility] Clinical Pharmacist [facility] [phone]

## 2022-01-26 NOTE — Progress Notes (Signed)
Chronic Care Management Pharmacy Note  01/26/2022 Name:  Leslie [name] MRN:  [id number] DOB:  1930-12-19  Summary: CCM F/U visit -Reviewed medications; pt affirms compliance as prescribed -Home BP range 119/57-150/80; mostly < 140/90 -Pt reports worsening fatigue, she feels related to aging. She reports sleeping reasonably well at night; reviewed common causes of fatigue including low Vitamin D, B12 or thyroid - she takes medication/supplements for all of these currently; she does not feel she needs to be evaluated urgently for fatigue and will keep PCP appt in November  Recommendations/Changes made from today's visit: -No med changes; advised pt to call office if she would like evaluation for fatigue  Plan: -[facility] will call patient 3 months for general update -Pharmacist follow up televisit scheduled for 6 months -PCP CPE 06/16/22    Subjective: Leslie [name] is an 88 y.o. year old female who is a primary patient of [name], [name], MD.  The CCM team was consulted for assistance with disease management and care coordination needs.    Engaged with patient by telephone for follow up visit in response to provider referral for pharmacy case management and/or care coordination services.   Consent to Services:  The patient was given information about Chronic Care Management services, agreed to services, and gave verbal consent prior to initiation of services.  Please see initial visit note for detailed documentation.   Patient Care Team: [name], MD as PCP - General (Internal Medicine) [name], MD as PCP - Cardiology (Cardiology) [name], [facility] as Pharmacist (Pharmacist)   Recent office visits: 06/10/21-PCP-Richard [name],MD-Patient presented for AWV.Discussed screenings,has gotten flu shot, vaccines discussed-no medication changes, follow up 1 year  04/20/21-PCP-Richard [name],MD-Patient presented for follow up from ER visit.Pain left  clavicle area.No medication changes   Recent consult visits: 07/17/21-Cardiology-[name],MD-May need to increase Accupril or switch to Entresto depending on BP follow up. We decreased Toprol because of bradycardia less than 50.  Need to increase Accupril to at least 20 mg/day but will allow to gather additional data over the weekend and make final decision about increasing Accupril dose based upon the data that she gets from home. Follow up 1 year. --BP was normal at home, no action required.  Hospital visits: 09/05/21 ED visit ([facility]): dog bite. Rx'd Augmentin.  04/15/21-[facility] presented for chest wall pain.Labs,EKG,Xrays ordered,normal findings-No admission   Objective:  Lab Results  Component Value Date   CREATININE 1.01 (H) 04/15/2021   BUN 19 04/15/2021   GFR 55.16 (L) 06/06/2020   GFRNONAA 54 (L) 04/15/2021   GFRAA 60 10/03/2020   NA 138 04/15/2021   K 4.3 04/15/2021   CALCIUM 8.7 (L) 04/15/2021   CO2 23 04/15/2021   GLUCOSE 91 04/15/2021    Lab Results  Component Value Date/Time   GFR 55.16 (L) 06/06/2020 12:46 PM   GFR 43.72 (L) 12/19/2019 10:41 AM    Last diabetic Eye exam: No results found for: "HMDIABEYEEXA"  Last diabetic Foot exam: No results found for: "HMDIABFOOTEX"   Lab Results  Component Value Date   CHOL 194 07/06/2017   HDL 56.20 07/06/2017   LDLCALC 111 (H) 07/06/2017   TRIG 131.0 07/06/2017   CHOLHDL 3 07/06/2017       Latest Ref Rng & Units 04/15/2021    1:13 PM 06/06/2020   12:46 PM 12/19/2019   10:41 AM  Hepatic Function  Total Protein 6.5 - 8.1 g/dL 6.1  6.5  Albumin 3.5 - 5.0 g/dL 3.3  4.1    4.1  4.2   AST 15 - 41 U/L 16  13    ALT 0 - 44 U/L 10  7    Alk Phosphatase 38 - 126 U/L 39  52    Total Bilirubin 0.3 - 1.2 mg/dL 0.8  0.7    Bilirubin, Direct 0.0 - 0.3 mg/dL  0.1      Lab Results  Component Value Date/Time   TSH 6.28 (H) 06/06/2020 12:46 PM   TSH 2.39 06/05/2019 11:54  AM   FREET4 0.87 06/06/2020 12:46 PM   FREET4 0.99 12/19/2019 10:41 AM       Latest Ref Rng & Units 04/15/2021    1:13 PM 06/06/2020   12:46 PM 12/19/2019   10:41 AM  CBC  WBC 4.0 - 10.5 K/uL 5.4  5.3  5.4   Hemoglobin 12.0 - 15.0 g/dL 13.1  13.7  13.4   Hematocrit 36.0 - 46.0 % 42.2  41.8  40.3   Platelets 150 - 400 K/uL 173  195.0  226.0     Lab Results  Component Value Date/Time   VD25OH 17.49 (L) 06/06/2020 12:46 PM    Clinical ASCVD: Yes  - CVA The ASCVD Risk score (Arnett DK, et al., 2019) failed to calculate for the following reasons:   The 2019 ASCVD risk score is only valid for ages 84 to 24       06/10/2021   12:22 PM 01/13/2018   12:03 PM  Depression screen PHQ 2/9  Decreased Interest 0 0  Down, Depressed, Hopeless 0 0  PHQ - 2 Score 0 0     Social History   Tobacco Use  Smoking Status Never  Smokeless Tobacco Never   BP Readings from Last 3 Encounters:  01/26/22 126/66  09/05/21 (!) 162/65  07/30/21 136/73   Pulse Readings from Last 3 Encounters:  09/05/21 (!) 59  07/17/21 (!) 59  06/10/21 60   Wt Readings from Last 3 Encounters:  01/26/22 148 lb (67.1 kg)  09/05/21 145 lb (65.8 kg)  07/17/21 152 lb 3.2 oz (69 kg)   BMI Readings from Last 3 Encounters:  01/26/22 23.89 kg/m  09/05/21 23.40 kg/m  07/17/21 24.57 kg/m    Assessment/Interventions: Review of patient past medical history, allergies, medications, health status, including review of consultants reports, laboratory and other test data, was performed as part of comprehensive evaluation and provision of chronic care management services.   SDOH:  (Social Determinants of Health) assessments and interventions performed: Yes SDOH Interventions    Flowsheet Row Most Recent Value  SDOH Interventions   Food Insecurity Interventions Intervention Not Indicated  Financial Strain Interventions Intervention Not Indicated       SDOH Screenings   Alcohol Screen: Not on file  Depression  (PHQ2-9): Low Risk  (06/10/2021)   Depression (PHQ2-9)    PHQ-2 Score: 0  Financial Resource Strain: Low Risk  (01/26/2022)   Overall Financial Resource Strain (CARDIA)    Difficulty of Paying Living Expenses: Not very hard  Food Insecurity: No Food Insecurity (01/26/2022)   Hunger Vital Sign    Worried About Running Out of Food in the Last Year: Never true    Ran Out of Food in the Last Year: Never true  Housing: Not on file  Physical Activity: Not on file  Social Connections: Not on file  Stress: Not on file  Tobacco Use: Low Risk  (09/05/2021)   Patient History  Smoking Tobacco Use: Never    Smokeless Tobacco Use: Never    Passive Exposure: Not on file  Transportation Needs: Not on file    Henry  Allergies  Allergen Reactions   Sulfa Antibiotics Other (See Comments)    "urine crystallizes"   Cephalexin Diarrhea   Dilaudid [Hydromorphone Hcl]         Xopenex [Levalbuterol Hcl] Other (See Comments)    "gives me the shakes"    Medications Reviewed Today     Reviewed by [name], [facility] (Pharmacist) on 08/31/21 at 1455  Med List Status: <None>   Medication Order Taking? Sig Documenting Provider Last Dose Status Informant  BD INTEGRA SYRINGE 25G X 1" 3 ML MISC [id number] Yes USE 1 EVERY 30 (THIRTY) DAYS. WITH VITAMINE B 12 INJECTION [name], MD Taking Active Self  Calcium Carbonate-Vit D-Min (CALCIUM 1200 PO) [id number] Yes Take 1 tablet by mouth daily. [provider] Taking Active Self  citalopram (CELEXA) 20 MG tablet [id number] Yes TAKE 1 TABLET BY MOUTH EVERY DAY [name], MD Taking Active   clopidogrel (PLAVIX) 75 MG tablet [id number] Yes TAKE 1 TABLET BY MOUTH EVERY DAY  Patient taking differently: Take 75 mg by mouth daily.   [name], MD Taking Active   cyanocobalamin (,VITAMIN B-12,) 1000 MCG/ML injection [id number] Yes INJECT 1 ML (1,000 MCG TOTAL) INTO THE MUSCLE EVERY 30 (THIRTY) DAYS. [name], MD Taking  Active Self  dapagliflozin propanediol (FARXIGA) 10 MG TABS tablet [id number] Yes Take 1 tablet (10 mg total) by mouth daily before breakfast. [name], MD Taking Active Self  levothyroxine (SYNTHROID) 75 MCG tablet [id number] Yes TAKE 1 TABLET BY MOUTH EVERY DAY [name], MD Taking Active   LORazepam (ATIVAN) 1 MG tablet [id number] Yes TAKE 1/2 TO 1 TABLET BY MOUTH AT BEDTIME AS NEEDED [name], MD Taking Active   metoprolol succinate (TOPROL XL) 25 MG 24 hr tablet [id number] Yes Take 0.5 tablets (12.5 mg total) by mouth daily. [name], MD Taking Active Self  NEEDLE, DISP, 25 G (B-D DISP NEEDLE 25GX1") 25G X 1" [place] [id number] Yes 1 each by Does not apply route every 30 (thirty) days. [name], MD Taking Active Self  Polyvinyl Alcohol-Povidone [facility] OP) [id number] Yes Place 1 drop into both eyes in the morning, at noon, in the evening, and at bedtime. [provider] Taking Active Self  quinapril (ACCUPRIL) 10 MG tablet [id number] Yes TAKE 1 TABLET BY MOUTH EVERY DAY  Patient taking differently: Take 10 mg by mouth daily.   [name], MD Taking Active             Patient Active Problem List   Diagnosis Date Noted   Acquired thrombophilia ([place]) 06/05/2019   Preventative health care 01/13/2018   Carotid artery disease ([place]) 01/13/2018   Stage 3b chronic kidney disease ([place]) 01/13/2018   Advance directive discussed with patient 01/13/2018   GERD (gastroesophageal reflux disease) 07/06/2017   Mood disorder ([place]) 07/06/2017   Vitamin B12 deficiency 07/06/2017   History of CVA (cerebrovascular accident) 07/06/2017   LBBB (left bundle branch block) 06/16/2014   Chronic combined systolic and diastolic heart failure ([place]) 06/16/2014   Hypothyroidism 05/25/2012   HTN (hypertension) 05/25/2012    Immunization History  Administered Date(s) Administered   Fluad Quad(high Dose 65+) 06/05/2019, 06/06/2020, 04/20/2021   Influenza,inj,Quad  PF,6+ Mos 05/10/2018   Influenza-Unspecified 05/09/2017   PFIZER(Purple Top)SARS-COV-2 Vaccination 08/30/2019,  09/20/2019, 08/18/2020   Pfizer Covid-19 Vaccine Bivalent Booster 27yr & up 06/24/2021   Pneumococcal Conjugate-13 02/04/2014   Pneumococcal Polysaccharide-23 08/10/2007, 01/13/2018   Td 08/10/2007   Tdap 07/02/2020   Zoster, Live 02/22/2013    Conditions to be addressed/monitored:  Hypertension, Hyperlipidemia, Heart Failure, Chronic Kidney Disease, Depression, and Anxiety  Care Plan : [place] Updates made by FCharlton Haws RRichmond Heightssince 01/26/2022 12:00 AM     Problem: Hypertension, Hyperlipidemia, Heart Failure, Chronic Kidney Disease, Depression, and Anxiety   Priority: High     Long-Range Goal: Disease mgmt   Start Date: 07/30/2021  Expected End Date: 07/30/2022  Recent Progress: On track  Priority: High  Note:   Current Barriers:  None identified  Pharmacist Clinical Goal(s):  Patient will contact provider office for questions/concerns as evidenced notation of same in electronic health record through collaboration with PharmD and provider.   Interventions: 1:1 collaboration with LVenia Carbon MD regarding development and update of comprehensive plan of care as evidenced by provider attestation and co-signature Inter-disciplinary care team collaboration (see longitudinal plan of care) Comprehensive medication review performed; medication list updated in electronic medical record  Hypertension / Heart Failure / CKD Stage 3(BP goal <140/90) -Controlled - pt affirms compliance with medications as below; she receives FIranthrough PAP -Last ejection fraction: 30-35% (Date: 06/27/19) -HF type: Combined Systolic and Diastolic -Current home BP/HR readings: 119/57, 143/63, 144/69, 126/66, 150/80 -Weight: 148 lb, she is not weighing on daily basis but reports weight is stable -Current treatment: Metoprolol succinate 25 mg - 1/2 tab daily -  Appropriate, Effective, Safe, Accessible Quinapril 10 mg daily -Appropriate, Effective, Safe, Accessible Farxiga 10 mg daily -Appropriate, Effective, Safe, Accessible -Denies hypotensive/hypertensive symptoms -Educated on BP goals and benefits of medications for prevention of heart attack, stroke and kidney damage; -Educated on Benefits of medications for managing symptoms and prolonging life -Counseled to monitor BP at home periodically -Recommended to continue current medication  Hyperlipidemia / hx CVA (LDL goal < 70) -Query controlled - last lipid panel was 06/2017 and LDL was 111; pt endorses compliance with clopidogrel;  -She has never been on a statin; Given patient age and risk vs. benefit of statin initiation, it is reasonable to continue without statin -Current treatment: Clopidogrel 75 mg daily -Appropriate, Effective, Safe, Accessible -Educated on Cholesterol goals; Importance of limiting foods high in cholesterol; -Recommended to continue current medication  Mood disorder (Goal: manage symptoms)  -Controlled - pt report she was put on citalopram after her husband passed, she did not have what she considers depression; she would like to avoid making changes at her age and will continue medication; she reports she can't sleep without lorazepam -Pt endorses low appetite today, but she does eat 3 meals a day, just less snacking and smaller meals -Current treatment: Citalopram 20 mg daily -Appropriate, Effective, Safe, Accessible Lorazepam 1 mg - 1/2 to 1 tab HS prn -Appropriate, Effective, Safe, Accessible -Pt is fairly dependent on lorazepam for sleep however she has been on it many years, she is not abusing it or at risk for overdose so it is reasonable to continue -Recommended to continue current medication  Chronic Kidney Disease Stage 3a  -All medications assessed for renal dosing and appropriateness in chronic kidney disease. -BP at goal; pt denies NSAID use -Recommended  to continue current medication  Health Maintenance -Vaccine gaps: Shingrix -Current therapy:  B12 injection monthly (self-injection) Calcium-Vitamin D -Patient is satisfied with current therapy and denies issues -  Recommended to continue current medication  Patient Goals/Self-Care Activities Patient will:  - take medications as prescribed as evidenced by patient report and record review focus on medication adherence by routine check blood pressure periodically, document, and provide at future appointments     Medication Assistance:  Farxiga (AZ&Me) - enrolled 08/31/21-08/08/22. Prescriber Dr Tamala Julian  Compliance/Adherence/Medication fill history: Care Gaps: DEXA (never done)  Star-Rating Drugs: Quinapril 10 mg - LF 11/23/21 x 90 ds; PDC unavailable Farxiga 10 mg - gets through PAP  Medication Access: Within the past 30 days, how often has patient missed a dose of medication? 0 Is a pillbox or other method used to improve adherence? Yes  Factors that may affect medication adherence? no barriers identified Are meds synced by current pharmacy? No  Are meds delivered by current pharmacy? No  Does patient experience delays in picking up medications due to transportation concerns? No   [facility] Services Reviewed: Is patient disadvantaged to use [facility]?: Yes  Current Rx insurance plan: [facility] [place] Name and location of Current pharmacy[facility]  [id number] [place] [address]Phone: [phone]: [phone] [facility] services reviewed with patient today?: No  Patient requests to transfer care to [facility]?: No  Reason patient declined to change pharmacies: Disadvantaged due to insurance/mail order   Care Plan and Follow Up Patient Decision:  Patient agrees to Care Plan and Follow-up.  Plan: Telephone follow up appointment with care management team member scheduled for:  6  months  LCharlene Brooke PharmD, BCACP Clinical Pharmacist [facility][phone]

## 2022-01-28 DIAGNOSIS — H04123 Dry eye syndrome of bilateral lacrimal glands: Secondary | ICD-10-CM | POA: Diagnosis not present

## 2022-01-28 DIAGNOSIS — H52222 Regular astigmatism, left eye: Secondary | ICD-10-CM | POA: Diagnosis not present

## 2022-01-28 DIAGNOSIS — H353120 Nonexudative age-related macular degeneration, left eye, stage unspecified: Secondary | ICD-10-CM | POA: Diagnosis not present

## 2022-01-28 DIAGNOSIS — H52221 Regular astigmatism, right eye: Secondary | ICD-10-CM | POA: Diagnosis not present

## 2022-01-28 DIAGNOSIS — H4922 Sixth [abducent] nerve palsy, left eye: Secondary | ICD-10-CM | POA: Diagnosis not present

## 2022-01-28 DIAGNOSIS — H524 Presbyopia: Secondary | ICD-10-CM | POA: Diagnosis not present

## 2022-01-28 DIAGNOSIS — H5211 Myopia, right eye: Secondary | ICD-10-CM | POA: Diagnosis not present

## 2022-01-28 DIAGNOSIS — Z961 Presence of intraocular lens: Secondary | ICD-10-CM | POA: Diagnosis not present

## 2022-01-28 DIAGNOSIS — H5202 Hypermetropia, left eye: Secondary | ICD-10-CM | POA: Diagnosis not present

## 2022-02-03 DIAGNOSIS — H5022 Vertical strabismus, left eye: Secondary | ICD-10-CM | POA: Diagnosis not present

## 2022-02-03 DIAGNOSIS — H538 Other visual disturbances: Secondary | ICD-10-CM | POA: Diagnosis not present

## 2022-02-03 DIAGNOSIS — H532 Diplopia: Secondary | ICD-10-CM | POA: Diagnosis not present

## 2022-02-03 DIAGNOSIS — H50012 Monocular esotropia, left eye: Secondary | ICD-10-CM | POA: Diagnosis not present

## 2022-02-09 ENCOUNTER — Other Ambulatory Visit: Payer: Self-pay | Admitting: Internal Medicine

## 2022-02-10 NOTE — Telephone Encounter (Signed)
Last filled 01-05-22 #30 Last OV 06-10-21 Next OV 06-16-22 [facility] [place]

## 2022-02-12 ENCOUNTER — Other Ambulatory Visit: Payer: Self-pay | Admitting: Internal Medicine

## 2022-02-12 NOTE — Telephone Encounter (Signed)
Do you want to change this to something different.

## 2022-02-15 NOTE — Telephone Encounter (Signed)
New rx for lisinopril sent to the pharmacy.

## 2022-02-16 MED ORDER — LISINOPRIL 10 MG PO TABS: 10.0000 mg | ORAL_TABLET | Freq: Every day | ORAL | 3 refills | Status: DC

## 2022-02-16 NOTE — Addendum Note (Signed)
Addended by: Pilar Grammes on: 02/16/2022 08:09 AM   Modules accepted: Orders

## 2022-03-01 ENCOUNTER — Telehealth: Payer: Self-pay

## 2022-03-01 NOTE — Telephone Encounter (Signed)
**Note De-Identified Jearlean Demauro Obfuscation** We received the providers page of a [facility] and Me pt asst applicatuion for [place].  I have completed the page and have faxed it to Dr Thompson Caul nurse so she can obtain his signature, date it, and to fax all to [facility] and Me at the fax number written on the cover letter included.

## 2022-03-01 NOTE — Telephone Encounter (Signed)
Signed and faxed on 03/01/22.

## 2022-03-05 NOTE — Telephone Encounter (Signed)
**Note De-Identified Leslie [name] Obfuscation** Letter received from [facility] and [place] stating that they have shipped the pts [place].

## 2022-03-11 ENCOUNTER — Other Ambulatory Visit: Payer: Self-pay | Admitting: Internal Medicine

## 2022-03-11 ENCOUNTER — Telehealth: Payer: Self-pay | Admitting: Internal Medicine

## 2022-03-11 NOTE — Telephone Encounter (Signed)
Last filled 02/10/22 Last ov 06/29/21 Next ov 06/16/22

## 2022-03-11 NOTE — Telephone Encounter (Signed)
Pateint called in asking for Dr Silvio Pate to put a rx in for her needles. She needs sharp ones.

## 2022-03-11 NOTE — Telephone Encounter (Signed)
Called and poke to pt. Advised her we sent in syringes with needles on 01-26-22 #3 and 3 refills to [facility] [place]. She will contact the pharmacy.

## 2022-03-15 ENCOUNTER — Telehealth: Payer: Self-pay | Admitting: Internal Medicine

## 2022-03-15 DIAGNOSIS — H4912 Fourth [trochlear] nerve palsy, left eye: Secondary | ICD-10-CM | POA: Diagnosis not present

## 2022-03-15 DIAGNOSIS — H5022 Vertical strabismus, left eye: Secondary | ICD-10-CM | POA: Diagnosis not present

## 2022-03-15 DIAGNOSIS — H50012 Monocular esotropia, left eye: Secondary | ICD-10-CM | POA: Diagnosis not present

## 2022-03-15 NOTE — Telephone Encounter (Signed)
Patient son called in stating that the medicationLORazepam (ATIVAN) 1 MG tablet cannot be refilled in [facility] [place],however can be filled in 0.5 mg at [facility] [place] [facility] Dr. Rosanna Randy to know can this prescription be called in to this pharmacy?

## 2022-03-16 MED ORDER — LORAZEPAM 0.5 MG PO TABS: 0.5000 mg | ORAL_TABLET | Freq: Every day | ORAL | 0 refills | Status: DC | PRN

## 2022-03-16 NOTE — Telephone Encounter (Signed)
Tried to call son to let him know [facility] was [facility] [place]. No VM to leave a message.

## 2022-03-16 NOTE — Telephone Encounter (Signed)
Please let them know the Rx has been sent

## 2022-04-22 ENCOUNTER — Telehealth: Payer: Self-pay

## 2022-04-22 NOTE — Chronic Care Management (AMB) (Signed)
    Chronic Care Management Pharmacy Assistant   Name: Leslie [name]  MRN: [id number] DOB: 08-21-1930  Attempted contact with [name] 3 times on 04/23/22,04/26/22,04/30/22. Unsuccessful outreach. Will attempt contact next month.   Reason for Encounter: General Adherence   Recent office visits:  None since last CCM contact   Recent consult visits:  None since last CCM contact   Hospital visits:  None in previous 6 months  Medications: Outpatient Encounter Medications as of 04/22/2022  Medication Sig   Calcium Carbonate-Vit D-Min (CALCIUM 1200 PO) Take 1 tablet by mouth daily.   citalopram (CELEXA) 20 MG tablet TAKE 1 TABLET BY MOUTH EVERY DAY   clopidogrel (PLAVIX) 75 MG tablet TAKE 1 TABLET BY MOUTH EVERY DAY   dapagliflozin propanediol (FARXIGA) 10 MG TABS tablet Take 1 tablet (10 mg total) by mouth daily before breakfast.   DODEX 1000 MCG/ML injection INJECT 1 ML (1,000 MCG TOTAL) INTO THE MUSCLE EVERY 30 DAYS.   levothyroxine (SYNTHROID) 75 MCG tablet TAKE 1 TABLET BY MOUTH EVERY DAY   lisinopril (ZESTRIL) 10 MG tablet Take 1 tablet (10 mg total) by mouth daily.   LORazepam (ATIVAN) 0.5 MG tablet Take 1-2 tablets (0.5-1 mg total) by mouth daily as needed for anxiety.   LORazepam (ATIVAN) 1 MG tablet TAKE 1/2 TO 1 TABLET BY MOUTH AT BEDTIME AS NEEDED   metoprolol succinate (TOPROL-XL) 25 MG 24 hr tablet TAKE 1/2 TABLET BY MOUTH EVERY DAY   Polyvinyl Alcohol-Povidone (REFRESH OP) Place 1 drop into both eyes in the morning, at noon, in the evening, and at bedtime.   SYRINGE-NEEDLE, DISP, 3 ML (BD INTEGRA SYRINGE) 25G X 1" 3 ML MISC USE 1 EVERY 30 (THIRTY) DAYS. WITH VITAMIN B12 INJECTION   No facility-administered encounter medications on file as of 04/22/2022.    Star Medications: Medication Name/mg Last Fill Days Supply Farxiga '10mg'$  Lisinopril '10mg'$   02/16/22 90    Summary of recommendations from last [facility] visit (Date:01/26/22) Summary: CCM F/U  visit -Reviewed medications; pt affirms compliance as prescribed -Home BP range 119/57-150/80; mostly < 140/90 -Pt reports worsening fatigue, she feels related to aging. She reports sleeping reasonably well at night; reviewed common causes of fatigue including low Vitamin D, B12 or thyroid - she takes medication/supplements for all of these currently; she does not feel she needs to be evaluated urgently for fatigue and will keep PCP appt in November   Recommendations/Changes made from today's visit: -No med changes; advised pt to call office if she would like evaluation for fatigue  Upcoming appointments: PCP appointment on 06/26/22 and CCM appointment on 07/28/22   [name], CPP notified  Avel Sensor, [place]  [phone]

## 2022-05-14 ENCOUNTER — Telehealth: Payer: Self-pay

## 2022-05-14 NOTE — Chronic Care Management (AMB) (Signed)
01/26/22   Chronic Care Management Pharmacy Assistant   Name: Leslie [name]  MRN: [id number] DOB: 1930-12-17   Reason for Encounter:General Adherence    Recent office visits:  None since last CCM contact  Recent consult visits:  None since last CCM contact  Hospital visits:  None in previous 6 months  Medications: Outpatient Encounter Medications as of 05/14/2022  Medication Sig   Calcium Carbonate-Vit D-Min (CALCIUM 1200 PO) Take 1 tablet by mouth daily.   citalopram (CELEXA) 20 MG tablet TAKE 1 TABLET BY MOUTH EVERY DAY   clopidogrel (PLAVIX) 75 MG tablet TAKE 1 TABLET BY MOUTH EVERY DAY   dapagliflozin propanediol (FARXIGA) 10 MG TABS tablet Take 1 tablet (10 mg total) by mouth daily before breakfast.   DODEX 1000 MCG/ML injection INJECT 1 ML (1,000 MCG TOTAL) INTO THE MUSCLE EVERY 30 DAYS.   levothyroxine (SYNTHROID) 75 MCG tablet TAKE 1 TABLET BY MOUTH EVERY DAY   lisinopril (ZESTRIL) 10 MG tablet Take 1 tablet (10 mg total) by mouth daily.   LORazepam (ATIVAN) 0.5 MG tablet Take 1-2 tablets (0.5-1 mg total) by mouth daily as needed for anxiety.   LORazepam (ATIVAN) 1 MG tablet TAKE 1/2 TO 1 TABLET BY MOUTH AT BEDTIME AS NEEDED   metoprolol succinate (TOPROL-XL) 25 MG 24 hr tablet TAKE 1/2 TABLET BY MOUTH EVERY DAY   Polyvinyl Alcohol-Povidone (REFRESH OP) Place 1 drop into both eyes in the morning, at noon, in the evening, and at bedtime.   SYRINGE-NEEDLE, DISP, 3 ML (BD INTEGRA SYRINGE) 25G X 1" 3 ML MISC USE 1 EVERY 30 (THIRTY) DAYS. WITH VITAMIN B12 INJECTION   No facility-administered encounter medications on file as of 05/14/2022.     Contacted Leslie [name] on 05/27/22 for general disease state and medication adherence call.   Patient is not more than 5 days past due for refill on the following medications per chart history:  [facility] Medications: Medication Name/mg Last Fill Days Supply Lisinopril '10mg'$   02/16/22 90 Farxiga '10mg'$     manufacturer .   What concerns  do you have about your medications? No medication concerns  The patient denies side effects with their medications.   How often do you forget or accidentally miss a dose? Never  Do you use a pillbox? Yes  Are you having any problems getting your medications from your pharmacy? No  Has the cost of your medications been a concern? No  Since last visit with CPP, the following interventions have been made. Patient continues to get [place] from Manufacturer and doing well with this medication  The patient has not had an ED visit since last contact.   The patient reports problems with their health.The patient reports she continues to have fatigue throughout the day. She will discuss with PCP at upcoming appointment.   Patient denies concerns or questions for Leslie [name], PharmD at this time.   Counseled patient on:  [place] job taking medications, Importance of taking medication daily without missed doses, Benefits of adherence packaging or a pillbox, and Access to CCM team for any cost, medication or pharmacy concerns.   Care Gaps: Annual wellness visit in last year? Yes Most Recent BP reading:134/86  06/10/21   Summary of recommendations from last [place] visit (Date:01/26/22) Summary: CCM F/U visit -Reviewed medications; pt affirms compliance as prescribed -Home BP range 119/57-150/80; mostly < 140/90 -Pt reports worsening fatigue, she feels related to aging. She reports sleeping reasonably well at night; reviewed common causes of fatigue including  low Vitamin D, B12 or thyroid - she takes medication/supplements for all of these currently; she does not feel she needs to be evaluated urgently for fatigue and will keep PCP appt in November   Recommendations/Changes made from today's visit: -No med changes; advised pt to call office if she would like evaluation for fatigue  Upcoming appointments: PCP appointment on 06/16/22 and CCM appointment on 07/28/22  Leslie [name],  CPP notified  Leslie [name], [place]  [phone]

## 2022-05-18 ENCOUNTER — Other Ambulatory Visit: Payer: Self-pay | Admitting: Internal Medicine

## 2022-05-18 NOTE — Telephone Encounter (Signed)
0.'5mg'$  #60 last filled 03-16-22 due to lack of supply of '1mg'$  that day.

## 2022-05-31 DIAGNOSIS — H4922 Sixth [abducent] nerve palsy, left eye: Secondary | ICD-10-CM | POA: Diagnosis not present

## 2022-05-31 DIAGNOSIS — H5022 Vertical strabismus, left eye: Secondary | ICD-10-CM | POA: Diagnosis not present

## 2022-05-31 DIAGNOSIS — H4912 Fourth [trochlear] nerve palsy, left eye: Secondary | ICD-10-CM | POA: Diagnosis not present

## 2022-06-16 ENCOUNTER — Encounter: Payer: Self-pay | Admitting: Internal Medicine

## 2022-06-16 ENCOUNTER — Ambulatory Visit (INDEPENDENT_AMBULATORY_CARE_PROVIDER_SITE_OTHER): Payer: Medicare Other | Admitting: Internal Medicine

## 2022-06-16 VITALS — BP 120/64 | HR 58 | Temp 97.8°F | Ht 64.5 in | Wt 145.0 lb

## 2022-06-16 DIAGNOSIS — N2581 Secondary hyperparathyroidism of renal origin: Secondary | ICD-10-CM

## 2022-06-16 DIAGNOSIS — I6529 Occlusion and stenosis of unspecified carotid artery: Secondary | ICD-10-CM | POA: Diagnosis not present

## 2022-06-16 DIAGNOSIS — F39 Unspecified mood [affective] disorder: Secondary | ICD-10-CM

## 2022-06-16 DIAGNOSIS — Z0000 Encounter for general adult medical examination without abnormal findings: Secondary | ICD-10-CM | POA: Diagnosis not present

## 2022-06-16 DIAGNOSIS — D6869 Other thrombophilia: Secondary | ICD-10-CM

## 2022-06-16 DIAGNOSIS — Z23 Encounter for immunization: Secondary | ICD-10-CM | POA: Diagnosis not present

## 2022-06-16 DIAGNOSIS — I5042 Chronic combined systolic (congestive) and diastolic (congestive) heart failure: Secondary | ICD-10-CM | POA: Diagnosis not present

## 2022-06-16 DIAGNOSIS — K2100 Gastro-esophageal reflux disease with esophagitis, without bleeding: Secondary | ICD-10-CM | POA: Diagnosis not present

## 2022-06-16 DIAGNOSIS — N1832 Chronic kidney disease, stage 3b: Secondary | ICD-10-CM

## 2022-06-16 DIAGNOSIS — F1320 Sedative, hypnotic or anxiolytic dependence, uncomplicated: Secondary | ICD-10-CM | POA: Insufficient documentation

## 2022-06-16 MED ORDER — PANTOPRAZOLE SODIUM 40 MG PO TBEC: 40.0000 mg | DELAYED_RELEASE_TABLET | Freq: Every day | ORAL | 11 refills | Status: DC

## 2022-06-16 NOTE — Assessment & Plan Note (Signed)
I have personally reviewed the Medicare Annual Wellness questionnaire and have noted 1. The patient's medical and social history 2. Their use of alcohol, tobacco or illicit drugs 3. Their current medications and supplements 4. The patient's functional ability including ADL's, fall risks, home safety risks and hearing or visual             impairment. 5. Diet and physical activities 6. Evidence for depression or mood disorders  The patients weight, height, BMI and visual acuity have been recorded in the chart I have made referrals, counseling and provided education to the patient based review of the above and I have provided the pt with a written personalized care plan for preventive services.  I have provided you with a copy of your personalized plan for preventive services. Please take the time to review along with your updated medication list.  No cancer screening Discussed exercise---and should use cane/walking stick to help stability Flu vaccine today COVID vaccine at the pharmacy--can consider the shingrix as well

## 2022-06-16 NOTE — Assessment & Plan Note (Signed)
EF improved after low Compensated now On farxiga 10, metoprolol 12.5 mg daily and lisinopril 10 Would wean if regular dizziness

## 2022-06-16 NOTE — Progress Notes (Signed)
Hearing Screening - Comments:: Has hearing aids. Wearing them today Vision Screening - Comments:: March 2023

## 2022-06-16 NOTE — Patient Instructions (Addendum)
Please start the acid blocker pantoprazole daily (on an empty stomach--like at bedtime) for at least the next month. If your swallowing and heartburn symptoms are better, you can try to cut back to every other day and eventually stop.  PLease get the updated COVID vaccine at your pharmacy---and you can also get the shingles vaccine (shingrix).

## 2022-06-16 NOTE — Assessment & Plan Note (Signed)
PDMP reviewed No concerns 
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
## 2022-06-16 NOTE — Assessment & Plan Note (Signed)
On imaging No statin though

## 2022-06-16 NOTE — Assessment & Plan Note (Signed)
Is on the vitamin D Wil recheck PTH

## 2022-06-16 NOTE — Assessment & Plan Note (Signed)
Depression worse now--reactive Plans to get new pets Does okay when out with people----discussed Continue the citalopram 20 Lorazepam for sleep Will check back in 3 months

## 2022-06-16 NOTE — Assessment & Plan Note (Signed)
Last GFR improved Is on the lisinopril

## 2022-06-16 NOTE — Assessment & Plan Note (Signed)
Having increased symptoms Will start protonix for 1-2 months and wean if doing better

## 2022-06-16 NOTE — Assessment & Plan Note (Signed)
Related to the clopidogrel

## 2022-06-16 NOTE — Addendum Note (Signed)
Addended by: Ellamae Sia on: 06/16/2022 02:40 PM   Modules accepted: Orders

## 2022-06-21 ENCOUNTER — Other Ambulatory Visit: Payer: Self-pay | Admitting: Internal Medicine

## 2022-06-21 NOTE — Telephone Encounter (Signed)
Last filled 05-18-22 #30 Last OV 06-16-22 Next OV 09-16-22 [facility]

## 2022-07-22 ENCOUNTER — Other Ambulatory Visit: Payer: Self-pay | Admitting: Internal Medicine

## 2022-07-22 ENCOUNTER — Other Ambulatory Visit: Payer: Self-pay | Admitting: Interventional Cardiology

## 2022-07-23 ENCOUNTER — Telehealth: Payer: Self-pay

## 2022-07-23 NOTE — Chronic Care Management (AMB) (Signed)
    Chronic Care Management Pharmacy Assistant   Name: Leslie [name]  MRN: [id number] DOB: 03-15-1931  Reason for Encounter: Reminder Call    Medications: Outpatient Encounter Medications as of 07/23/2022  Medication Sig   Calcium Carbonate-Vit D-Min (CALCIUM 1200 PO) Take 1 tablet by mouth daily.   citalopram (CELEXA) 20 MG tablet TAKE 1 TABLET BY MOUTH EVERY DAY   clopidogrel (PLAVIX) 75 MG tablet TAKE 1 TABLET BY MOUTH EVERY DAY   dapagliflozin propanediol (FARXIGA) 10 MG TABS tablet Take 1 tablet (10 mg total) by mouth daily before breakfast.   DODEX 1000 MCG/ML injection INJECT 1 ML (1,000 MCG TOTAL) INTO THE MUSCLE EVERY 30 DAYS.   levothyroxine (SYNTHROID) 75 MCG tablet TAKE 1 TABLET BY MOUTH EVERY DAY   lisinopril (ZESTRIL) 10 MG tablet Take 1 tablet (10 mg total) by mouth daily.   LORazepam (ATIVAN) 1 MG tablet TAKE 1/2 TO 1 TABLET BY MOUTH AT BEDTIME AS NEEDED   metoprolol succinate (TOPROL-XL) 25 MG 24 hr tablet TAKE 1/2 TABLET BY MOUTH EVERY DAY   pantoprazole (PROTONIX) 40 MG tablet Take 1 tablet (40 mg total) by mouth daily.   Polyvinyl Alcohol-Povidone (REFRESH OP) Place 1 drop into both eyes in the morning, at noon, in the evening, and at bedtime.   SYRINGE-NEEDLE, DISP, 3 ML (BD INTEGRA SYRINGE) 25G X 1" 3 ML MISC USE 1 EVERY 30 (THIRTY) DAYS. WITH VITAMIN B12 INJECTION   No facility-administered encounter medications on file as of 07/23/2022.    [name] was contacted to remind of upcoming telephone visit with [name] on 07/28/22 at 11:00. Patient was reminded to have any blood glucose and blood pressure readings available for review at appointment.   Message was left reminding patient of appointment.  CCM referral has been placed prior to visit?  No   Star Rating Drugs: Medication:  Last Fill: Day Supply Farxiga '10mg'$    manufacturer Lisinopril '10mg'$  05/23/22 [place], CPP notified  Avel Sensor, [place]   [phone]

## 2022-07-27 NOTE — Telephone Encounter (Signed)
Last filled 06-21-22 #30 Last OV 06-16-22 Next OV 09-16-22 [facility]

## 2022-07-28 ENCOUNTER — Ambulatory Visit: Payer: Medicare Other | Admitting: Pharmacist

## 2022-07-28 DIAGNOSIS — N1831 Chronic kidney disease, stage 3a: Secondary | ICD-10-CM

## 2022-07-28 DIAGNOSIS — I5042 Chronic combined systolic (congestive) and diastolic (congestive) heart failure: Secondary | ICD-10-CM

## 2022-07-28 DIAGNOSIS — F39 Unspecified mood [affective] disorder: Secondary | ICD-10-CM

## 2022-07-28 DIAGNOSIS — I10 Essential (primary) hypertension: Secondary | ICD-10-CM

## 2022-07-28 DIAGNOSIS — K2100 Gastro-esophageal reflux disease with esophagitis, without bleeding: Secondary | ICD-10-CM

## 2022-07-28 NOTE — Progress Notes (Signed)
Care Management Pharmacy Note  07/28/2022 Name:  [name] MRN:  [id number] DOB:  1930-09-26  Summary: F/U visit -Reviewed medications; pt affirms compliance as prescribed -Pt did not stay for labwork at annual visit last month  Recommendations/Changes made from today's visit: -No med changes;  -advised pt to call office to schedule lab appt at her convenience  Plan: -[facility] [facility] will call patient 1 month re: lab appt -Pharmacist follow up PRN -PCP appt 09/16/22; cardiology appt 09/06/22    Subjective: Leslie [name] is an 88 y.o. year old female who is a primary patient of [name], [name], MD.  The CCM team was consulted for assistance with disease management and care coordination needs.    Engaged with patient by telephone for follow up visit in response to provider referral for pharmacy case management and/or care coordination services.   Consent to Services:  The patient was given information about Chronic Care Management services, agreed to services, and gave verbal consent prior to initiation of services.  Please see initial visit note for detailed documentation.   Patient Care Team: [name], MD as PCP - General (Internal Medicine) [name], MD as PCP - Cardiology (Cardiology) [name], [facility] as Pharmacist (Pharmacist)   Recent office visits: 06/16/22 Dr Silvio Pate OV: annual - worsened GERD, start pantoprazole 1-2 months. Ordered labs but pt did not get them.  06/10/21-PCP-Richard [name],MD-Patient presented for AWV.Discussed screenings,has gotten flu shot, vaccines discussed-no medication changes, follow up 1 year  04/20/21-PCP-Richard [name],MD-Patient presented for follow up from ER visit.Pain left clavicle area.No medication changes   Recent consult visits: 07/17/21-Cardiology-[name],MD- f/u  Hospital visits: 09/05/21 ED visit ([facility]): dog bite. Rx'd Augmentin. 04/15/21 ED visit [facility]): chest wall pain. Workup wnl,  discharged.   Objective:  Lab Results  Component Value Date   CREATININE 1.01 (H) 04/15/2021   BUN 19 04/15/2021   GFR 55.16 (L) 06/06/2020   GFRNONAA 54 (L) 04/15/2021   GFRAA 60 10/03/2020   NA 138 04/15/2021   K 4.3 04/15/2021   CALCIUM 8.7 (L) 04/15/2021   CO2 23 04/15/2021   GLUCOSE 91 04/15/2021    Lab Results  Component Value Date/Time   GFR 55.16 (L) 06/06/2020 12:46 PM   GFR 43.72 (L) 12/19/2019 10:41 AM    Last diabetic Eye exam: No results found for: "HMDIABEYEEXA"  Last diabetic Foot exam: No results found for: "HMDIABFOOTEX"   Lab Results  Component Value Date   CHOL 194 07/06/2017   HDL 56.20 07/06/2017   LDLCALC 111 (H) 07/06/2017   TRIG 131.0 07/06/2017   CHOLHDL 3 07/06/2017       Latest Ref Rng & Units 04/15/2021    1:13 PM 06/06/2020   12:46 PM 12/19/2019   10:41 AM  Hepatic Function  Total Protein 6.5 - 8.1 g/dL 6.1  6.5    Albumin 3.5 - 5.0 g/dL 3.3  4.1    4.1  4.2   AST 15 - 41 U/L 16  13    ALT 0 - 44 U/L 10  7    Alk Phosphatase 38 - 126 U/L 39  52    Total Bilirubin 0.3 - 1.2 mg/dL 0.8  0.7    Bilirubin, Direct 0.0 - 0.3 mg/dL  0.1      Lab Results  Component Value Date/Time   TSH 6.28 (H) 06/06/2020 12:46 PM   TSH 2.39 06/05/2019 11:54 AM   FREET4 0.87 06/06/2020 12:46 PM   FREET4  0.99 12/19/2019 10:41 AM       Latest Ref Rng & Units 04/15/2021    1:13 PM 06/06/2020   12:46 PM 12/19/2019   10:41 AM  CBC  WBC 4.0 - 10.5 K/uL 5.4  5.3  5.4   Hemoglobin 12.0 - 15.0 g/dL 13.1  13.7  13.4   Hematocrit 36.0 - 46.0 % 42.2  41.8  40.3   Platelets 150 - 400 K/uL 173  195.0  226.0     Lab Results  Component Value Date/Time   VD25OH 17.49 (L) 06/06/2020 12:46 PM    Clinical ASCVD: Yes  - CVA The ASCVD Risk score (Arnett DK, et al., 2019) failed to calculate for the following reasons:   The 2019 ASCVD risk score is only valid for ages 33 to 6       06/10/2021   12:22 PM 01/13/2018   12:03 PM  Depression screen PHQ 2/9   Decreased Interest 0 0  Down, Depressed, Hopeless 0 0  PHQ - 2 Score 0 0     Social History   Tobacco Use  Smoking Status Never   Passive exposure: Never  Smokeless Tobacco Never   BP Readings from Last 3 Encounters:  06/16/22 120/64  01/26/22 126/66  09/05/21 (!) 162/65   Pulse Readings from Last 3 Encounters:  06/16/22 (!) 58  09/05/21 (!) 59  07/17/21 (!) 59   Wt Readings from Last 3 Encounters:  06/16/22 145 lb (65.8 kg)  01/26/22 148 lb (67.1 kg)  09/05/21 145 lb (65.8 kg)   BMI Readings from Last 3 Encounters:  06/16/22 24.50 kg/m  01/26/22 23.89 kg/m  09/05/21 23.40 kg/m    Assessment/Interventions: Review of patient past medical history, allergies, medications, health status, including review of consultants reports, laboratory and other test data, was performed as part of comprehensive evaluation and provision of chronic care management services.   SDOH:  (Social Determinants of Health) assessments and interventions performed: Yes SDOH Interventions    Flowsheet Row Chronic Care Management from 07/28/2022 in [place] at [place] Management from 01/26/2022 in [place] at [place] Management from 07/30/2021 in [place] at [place] Interventions -- Intervention Not Indicated --  Housing Interventions Intervention Not Indicated -- --  Utilities Interventions Intervention Not Indicated -- --  Financial Strain Interventions -- Intervention Not Indicated Intervention Not Indicated       [place]: No Food Insecurity (01/26/2022)  Housing: Low Risk  (07/28/2022)  Utilities: Not At Risk (07/28/2022)  Depression (PHQ2-9): Low Risk  (06/10/2021)  Financial Resource Strain: Low Risk  (01/26/2022)  Tobacco Use: Low Risk  (06/16/2022)    CCM Care Plan  Allergies  Allergen Reactions   Sulfa Antibiotics Other (See Comments)     "urine crystallizes"   Cephalexin Diarrhea   Dilaudid [Hydromorphone Hcl]         Xopenex [Levalbuterol Hcl] Other (See Comments)    "gives me the shakes"    Medications Reviewed Today     Reviewed by [name], MD (Physician) on 06/16/22 at 1113  Med List Status: <None>   Medication Order Taking? Sig Documenting Provider Last Dose Status Informant  Calcium Carbonate-Vit D-Min (CALCIUM 1200 PO) [id number] Yes Take 1 tablet by mouth daily. [provider] Taking Active Self  citalopram (CELEXA) 20 MG tablet [id number] Yes TAKE 1 TABLET BY MOUTH EVERY DAY [name], MD  Taking Active   clopidogrel (PLAVIX) 75 MG tablet [id number] Yes TAKE 1 TABLET BY MOUTH EVERY DAY [name], MD Taking Active   dapagliflozin propanediol (FARXIGA) 10 MG TABS tablet [id number] Yes Take 1 tablet (10 mg total) by mouth daily before breakfast. [name], MD Taking Active   DODEX 1000 MCG/ML injection [id number] Yes INJECT 1 ML (1,000 MCG TOTAL) INTO THE MUSCLE EVERY 30 DAYS. [name], MD Taking Active   levothyroxine (SYNTHROID) 75 MCG tablet [id number] Yes TAKE 1 TABLET BY MOUTH EVERY DAY [name], MD Taking Active   lisinopril (ZESTRIL) 10 MG tablet [id number] Yes Take 1 tablet (10 mg total) by mouth daily. [name], MD Taking Active   LORazepam (ATIVAN) 1 MG tablet [id number] Yes TAKE 1/2 TO 1 TABLET BY MOUTH AT BEDTIME AS NEEDED [name], MD Taking Active   metoprolol succinate (TOPROL-XL) 25 MG 24 hr tablet [id number] Yes TAKE 1/2 TABLET BY MOUTH EVERY DAY [name], MD Taking Active   Polyvinyl Alcohol-Povidone [facility]) [id number] Yes Place 1 drop into both eyes in the morning, at noon, in the evening, and at bedtime. [provider] Taking Active Self  SYRINGE-NEEDLE, DISP, 3 ML (BD INTEGRA SYRINGE) 25G X 1" 3 ML MISC [id number] Yes USE 1 EVERY 30 (THIRTY) DAYS. WITH VITAMIN B12 INJECTION [name], MD Taking Active              Patient Active Problem List   Diagnosis Date Noted   Benzodiazepine dependence ([place]) 06/16/2022   Secondary hyperparathyroidism, renal ([place]) 06/16/2022   Acquired thrombophilia ([place]) 06/05/2019   Preventative health care 01/13/2018   Carotid artery disease ([place]) 01/13/2018   Stage 3b chronic kidney disease ([place]) 01/13/2018   Advance directive discussed with patient 01/13/2018   GERD (gastroesophageal reflux disease) 07/06/2017   Mood disorder ([place]) 07/06/2017   Vitamin B12 deficiency 07/06/2017   History of CVA (cerebrovascular accident) 07/06/2017   LBBB (left bundle branch block) 06/16/2014   Chronic combined systolic and diastolic heart failure ([place]) 06/16/2014   Hypothyroidism 05/25/2012   HTN (hypertension) 05/25/2012    Immunization History  Administered Date(s) Administered   Fluad Quad(high Dose 65+) 06/05/2019, 06/06/2020, 04/20/2021, 06/16/2022   Influenza,inj,Quad PF,6+ Mos 05/10/2018   Influenza-Unspecified 05/09/2017   PFIZER(Purple Top)SARS-COV-2 Vaccination 08/30/2019, 09/20/2019, 08/18/2020   Pfizer Covid-19 Vaccine Bivalent Booster 70yr & up 06/24/2021   Pneumococcal Conjugate-13 02/04/2014   Pneumococcal Polysaccharide-23 08/10/2007, 01/13/2018   Td 08/10/2007   Tdap 07/02/2020   Zoster Recombinat (Shingrix) 07/13/2022   Zoster, Live 02/22/2013    Conditions to be addressed/monitored:  Hypertension, Hyperlipidemia, Heart Failure, Chronic Kidney Disease, Depression, and Anxiety  Care Plan : [place] Updates made by FCharlton Haws RBel[place]since 07/28/2022 12:00 AM     Problem: Hypertension, Hyperlipidemia, Heart Failure, Chronic Kidney Disease, Depression, and Anxiety   Priority: High     Long-Range Goal: Disease mgmt   Start Date: 07/30/2021  Expected End Date: 07/30/2022  This Visit's Progress: On track  Recent Progress: On track  Priority: High  Note:   Current Barriers:  None identified  Pharmacist Clinical  Goal(s):  Patient will contact provider office for questions/concerns as evidenced notation of same in electronic health record through collaboration with PharmD and provider.   Interventions: 1:1 collaboration with LVenia Carbon MD regarding development and update of comprehensive plan of care as evidenced by provider attestation and co-signature Inter-disciplinary care team collaboration (see longitudinal  plan of care) Comprehensive medication review performed; medication list updated in electronic medical record  Hypertension / Heart Failure / CKD Stage 3(BP goal <140/90) -Controlled - pt affirms compliance with medications as below; she receives [place] through PAP -Last ejection fraction: 30-35% (Date: 06/27/19) -HF type: HFrEF (EF < 40%) -Current home BP/HR readings: n/a -Weight: she is not weighing on daily basis but reports weight is stable -Current treatment: Metoprolol succinate 25 mg - 1/2 tab daily - Appropriate, Effective, Safe, Accessible Lisinopril 10 mg daily -Appropriate, Effective, Safe, Accessible Farxiga 10 mg daily (PAP) -Appropriate, Effective, Safe, Accessible -Denies hypotensive/hypertensive symptoms -Educated on BP goals and benefits of medications for prevention of heart attack, stroke and kidney damage; -Educated on Benefits of medications for managing symptoms and prolonging life -Counseled to monitor BP at home periodically and weigh daily to keep track of fluid status -Recommended to continue current medication; update labwork (renal panel)  Hyperlipidemia / hx CVA (LDL goal < 70) -Query controlled - last lipid panel was 06/2017 and LDL was 111; pt endorses compliance with clopidogrel;  -She has never been on a statin; Given patient age and risk vs. benefit of statin initiation, it is reasonable to continue without statin -Current treatment: Clopidogrel 75 mg daily -Appropriate, Effective, Safe, Accessible -Educated on Cholesterol goals; Importance of  limiting foods high in cholesterol; -Recommended to continue current medication; update labwork (lipids, CBC)  Mood disorder (Goal: manage symptoms)  -Controlled - pt report she was put on citalopram after her husband passed, she did not have what she considers depression; she would like to avoid making changes at her age and will continue medication; she reports she can't sleep without lorazepam -Pt endorses low appetite today, but she does eat 3 meals a day, just less snacking and smaller meals -Current treatment: Citalopram 20 mg daily -Appropriate, Effective, Safe, Accessible Lorazepam 1 mg - 1/2 to 1 tab HS prn -Appropriate, Effective, Safe, Accessible -Pt is fairly dependent on lorazepam for sleep however she has been on it many years, she is not abusing it or at risk for overdose so it is reasonable to continue -Recommended to continue current medication  GERD (Goal: minimize symptoms of reflux ) -Controlled - pt reports improved in symptoms with PPI -Current treatment  Pantoprazole 40 mg daily - Appropriate, Effective, Safe, Accessible -Medications previously tried: none reported  -Hx of Bleeds/ulcers: No -Recommended to continue current medication  Chronic Kidney Disease Stage 3a  -All medications assessed for renal dosing and appropriateness in chronic kidney disease. -BP at goal; pt denies NSAID use -Recommended to continue current medication  Health Maintenance -Vaccine gaps: Shingrix #2 due after 09/13/22 -Current therapy:  B12 injection monthly (self-injection) Calcium-Vitamin D -Patient is satisfied with current therapy and denies issues -Recommended to continue current medication; update labwork (B12, VitD)  Patient Goals/Self-Care Activities Patient will:  - take medications as prescribed as evidenced by patient report and record review focus on medication adherence by routine check blood pressure periodically, document, and provide at future appointments       Medication Assistance:  Farxiga (AZ&Me) - enrolled 08/31/21-08/08/22. Prescriber Dr Tamala Julian  Compliance/Adherence/Medication fill history: Care Gaps: DEXA (never done). Pt declines.  Star-Rating Drugs: Lisinopril - PDC 97% Farxiga 10 mg - gets through PAP  Medication Access: Within the past 30 days, how often has patient missed a dose of medication? 0 Is a pillbox or other method used to improve adherence? Yes  Factors that may affect medication adherence? no barriers identified Are meds synced  by current pharmacy? No  Are meds delivered by current pharmacy? No  Does patient experience delays in picking up medications due to transportation concerns? No   [facility] Services Reviewed: Is patient disadvantaged to use [facility]?: Yes  Current Rx insurance plan: [facility] Name and location of Current pharmacy:  [id number] [place] [id number]: [phone]: [phone] [facility] services reviewed with patient today?: No  Patient requests to transfer care to [facility]?: No  Reason patient declined to change pharmacies: Disadvantaged due to insurance/mail order   Care Plan and Follow Up Patient Decision:  Patient agrees to Care Plan and Follow-up.  Plan: The patient has been provided with contact information for the care management team and has been advised to call with any health related questions or concerns.   LCharlene Brooke PharmD, BCACP Clinical Pharmacist [phone]

## 2022-07-28 NOTE — Patient Instructions (Signed)
Visit Information  Phone number for Pharmacist: [phone]   Goals Addressed   None     Care Plan : [place]  Updates made by [name], [place] since 07/28/2022 12:00 AM     Problem: Hypertension, Hyperlipidemia, Heart Failure, Chronic Kidney Disease, Depression, and Anxiety   Priority: High     Long-Range Goal: Disease mgmt   Start Date: 07/30/2021  Expected End Date: 07/30/2022  This Visit's Progress: On track  Recent Progress: On track  Priority: High  Note:   Current Barriers:  None identified  Pharmacist Clinical Goal(s):  Patient will contact provider office for questions/concerns as evidenced notation of same in electronic health record through collaboration with PharmD and provider.   Interventions: 1:1 collaboration with [name], MD regarding development and update of comprehensive plan of care as evidenced by provider attestation and co-signature Inter-disciplinary care team collaboration (see longitudinal plan of care) Comprehensive medication review performed; medication list updated in electronic medical record  Hypertension / Heart Failure / CKD Stage 3(BP goal <140/90) -Controlled - pt affirms compliance with medications as below; she receives [place] through PAP -Last ejection fraction: 30-35% (Date: 06/27/19) -HF type: HFrEF (EF < 40%) -Current home BP/HR readings: n/a -Weight: she is not weighing on daily basis but reports weight is stable -Current treatment: Metoprolol succinate 25 mg - 1/2 tab daily - Appropriate, Effective, Safe, Accessible Lisinopril 10 mg daily -Appropriate, Effective, Safe, Accessible Farxiga 10 mg daily (PAP) -Appropriate, Effective, Safe, Accessible -Denies hypotensive/hypertensive symptoms -Educated on BP goals and benefits of medications for prevention of heart attack, stroke and kidney damage; -Educated on Benefits of medications for managing symptoms and prolonging life -Counseled to  monitor BP at home periodically and weigh daily to keep track of fluid status -Recommended to continue current medication; update labwork (renal panel)  Hyperlipidemia / hx CVA (LDL goal < 70) -Query controlled - last lipid panel was 06/2017 and LDL was 111; pt endorses compliance with clopidogrel;  -She has never been on a statin; Given patient age and risk vs. benefit of statin initiation, it is reasonable to continue without statin -Current treatment: Clopidogrel 75 mg daily -Appropriate, Effective, Safe, Accessible -Educated on Cholesterol goals; Importance of limiting foods high in cholesterol; -Recommended to continue current medication; update labwork (lipids, CBC)  Mood disorder (Goal: manage symptoms)  -Controlled - pt report she was put on citalopram after her husband passed, she did not have what she considers depression; she would like to avoid making changes at her age and will continue medication; she reports she can't sleep without lorazepam -Pt endorses low appetite today, but she does eat 3 meals a day, just less snacking and smaller meals -Current treatment: Citalopram 20 mg daily -Appropriate, Effective, Safe, Accessible Lorazepam 1 mg - 1/2 to 1 tab HS prn -Appropriate, Effective, Safe, Accessible -Pt is fairly dependent on lorazepam for sleep however she has been on it many years, she is not abusing it or at risk for overdose so it is reasonable to continue -Recommended to continue current medication  GERD (Goal: minimize symptoms of reflux ) -Controlled - pt reports improved in symptoms with PPI -Current treatment  Pantoprazole 40 mg daily - Appropriate, Effective, Safe, Accessible -Medications previously tried: none reported  -Hx of Bleeds/ulcers: No -Recommended to continue current medication  Chronic Kidney Disease Stage 3a  -All medications assessed for renal dosing and appropriateness in chronic kidney disease. -BP at goal; pt denies NSAID use -Recommended to  continue current  medication  Health Maintenance -Vaccine gaps: Shingrix #2 due after 09/13/22 -Current therapy:  B12 injection monthly (self-injection) Calcium-Vitamin D -Patient is satisfied with current therapy and denies issues -Recommended to continue current medication; update labwork (B12, VitD)  Patient Goals/Self-Care Activities Patient will:  - take medications as prescribed as evidenced by patient report and record review focus on medication adherence by routine check blood pressure periodically, document, and provide at future appointments       The patient verbalized understanding of instructions, educational materials, and care plan provided today and DECLINED offer to receive copy of patient instructions, educational materials, and care plan.  Telephone follow up appointment with pharmacy team member scheduled for: PRN  [name], PharmD, BCACP Clinical Pharmacist [facility] [facility] [phone]

## 2022-08-17 ENCOUNTER — Other Ambulatory Visit: Payer: Medicare Other

## 2022-08-19 ENCOUNTER — Telehealth: Payer: Self-pay

## 2022-08-19 NOTE — Progress Notes (Signed)
Contacted the patient and discussed appointment for lab work. The patient had to cancel once due to transportation issues . Encouraged the patient to have labs done prior to 09/16/22 appointment with PCP. The patient understands and will schedule soon as transportation is an issue for her.   [name], pharmD notified  Avel Sensor, [place]  [phone]

## 2022-08-20 ENCOUNTER — Other Ambulatory Visit: Payer: Self-pay | Admitting: Family

## 2022-08-31 ENCOUNTER — Other Ambulatory Visit (INDEPENDENT_AMBULATORY_CARE_PROVIDER_SITE_OTHER): Payer: Medicare Other

## 2022-08-31 DIAGNOSIS — I5042 Chronic combined systolic (congestive) and diastolic (congestive) heart failure: Secondary | ICD-10-CM

## 2022-08-31 DIAGNOSIS — N1832 Chronic kidney disease, stage 3b: Secondary | ICD-10-CM

## 2022-08-31 DIAGNOSIS — E538 Deficiency of other specified B group vitamins: Secondary | ICD-10-CM

## 2022-09-01 ENCOUNTER — Other Ambulatory Visit (INDEPENDENT_AMBULATORY_CARE_PROVIDER_SITE_OTHER): Payer: Medicare Other

## 2022-09-01 ENCOUNTER — Telehealth: Payer: Self-pay

## 2022-09-01 DIAGNOSIS — E538 Deficiency of other specified B group vitamins: Secondary | ICD-10-CM | POA: Diagnosis not present

## 2022-09-01 LAB — CBC
HCT: 43.9 % (ref 36.0–46.0)
Hemoglobin: 14.5 g/dL (ref 12.0–15.0)
MCHC: 33 g/dL (ref 30.0–36.0)
MCV: 91.6 fl (ref 78.0–100.0)
Platelets: 195 10*3/uL (ref 150.0–400.0)
RBC: 4.79 Mil/uL (ref 3.87–5.11)
RDW: 14.3 % (ref 11.5–15.5)
WBC: 5 10*3/uL (ref 4.0–10.5)

## 2022-09-01 LAB — VITAMIN D 25 HYDROXY (VIT D DEFICIENCY, FRACTURES): VITD: 15.59 ng/mL — ABNORMAL LOW (ref 30.00–100.00)

## 2022-09-01 LAB — RENAL FUNCTION PANEL
Albumin: 4 g/dL (ref 3.5–5.2)
BUN: 18 mg/dL (ref 6–23)
CO2: 29 mEq/L (ref 19–32)
Calcium: 8.9 mg/dL (ref 8.4–10.5)
Chloride: 105 mEq/L (ref 96–112)
Creatinine, Ser: 0.92 mg/dL (ref 0.40–1.20)
GFR: 55 mL/min — ABNORMAL LOW (ref >60.00)
Glucose, Bld: 91 mg/dL (ref 70–99)
Phosphorus: 4.3 mg/dL (ref 2.3–4.6)
Potassium: 4.9 mEq/L (ref 3.5–5.1)
Sodium: 142 mEq/L (ref 135–145)

## 2022-09-01 LAB — PARATHYROID HORMONE, INTACT (NO CA): PTH: 193 pg/mL — ABNORMAL HIGH (ref 16–77)

## 2022-09-01 LAB — LIPID PANEL
Cholesterol: 209 mg/dL — ABNORMAL HIGH (ref 0–200)
HDL: 71.4 mg/dL (ref >39.00)
LDL Cholesterol: 105 mg/dL — ABNORMAL HIGH (ref 0–99)
NonHDL: 137.61
Total CHOL/HDL Ratio: 3
Triglycerides: 164 mg/dL — ABNORMAL HIGH (ref 0.0–149.0)
VLDL: 32.8 mg/dL (ref 0.0–40.0)

## 2022-09-01 LAB — HEPATIC FUNCTION PANEL
ALT: 9 U/L (ref 0–35)
AST: 15 U/L (ref 0–37)
Albumin: 4 g/dL (ref 3.5–5.2)
Alkaline Phosphatase: 52 U/L (ref 39–117)
Bilirubin, Direct: 0.1 mg/dL (ref 0.0–0.3)
Total Bilirubin: 0.6 mg/dL (ref 0.2–1.2)
Total Protein: 6.4 g/dL (ref 6.0–8.3)

## 2022-09-01 LAB — T4, FREE: Free T4: 0.86 ng/dL (ref 0.60–1.60)

## 2022-09-01 LAB — MAGNESIUM: Magnesium: 2.3 mg/dL (ref 1.5–2.5)

## 2022-09-01 LAB — VITAMIN B12: Vitamin B-12: 533 pg/mL (ref 211–911)

## 2022-09-01 LAB — TSH: TSH: 2.7 u[IU]/mL (ref 0.35–5.50)

## 2022-09-01 NOTE — Telephone Encounter (Signed)
Pt called returning Shannon's missed call regarding results. Told the pt, [name]'s response. Pt had no questions/concerns. Call back # [phone]

## 2022-09-01 NOTE — Addendum Note (Signed)
Addended by: Ellamae Sia on: 09/01/2022 07:17 AM   Modules accepted: Orders

## 2022-09-01 NOTE — Telephone Encounter (Signed)
-----  Message from [name], MD sent at 09/01/2022 11:40 AM EST ----- Please call The vitamin D is low and parathyroid hormone level is high. This means you should be taking vitamin D. I would recommend over the counter 25 mcg (1000 units daily) Cholesterol is up  a little--just mildly elevated. I am not excited about cholesterol medications but we can discuss it at next month's visit Kidney function is improved and stable over many years (creatinine and GFR). Everything else, including B12, is normal Send copies

## 2022-09-01 NOTE — Telephone Encounter (Signed)
Left message for pt to call office

## 2022-09-02 NOTE — Telephone Encounter (Signed)
Thank you. I have mailed her a copy as well.
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
## 2022-09-06 ENCOUNTER — Encounter: Payer: Self-pay | Admitting: Internal Medicine

## 2022-09-06 ENCOUNTER — Ambulatory Visit: Payer: Medicare Other | Attending: Internal Medicine | Admitting: Internal Medicine

## 2022-09-06 VITALS — BP 160/80 | HR 59 | Ht 66.0 in | Wt 148.2 lb

## 2022-09-06 DIAGNOSIS — I5022 Chronic systolic (congestive) heart failure: Secondary | ICD-10-CM | POA: Diagnosis not present

## 2022-09-06 MED ORDER — NITROGLYCERIN 0.4 MG SL SUBL: 0.4000 mg | SUBLINGUAL_TABLET | SUBLINGUAL | 3 refills | Status: AC | PRN

## 2022-09-06 NOTE — Patient Instructions (Addendum)
Medication Instructions: The proper use and anticipated side effects of nitroglycerine has been carefully explained.  If a single episode of chest pain is not relieved by one tablet, the patient will try another within 5 minutes; and if this doesn't relieve the pain, the patient is instructed to call 911 for transportation to an emergency department.  *If you need a refill on your cardiac medications before your next appointment, please call your pharmacy*   Lab Work:  If you have labs (blood work) drawn today and your tests are completely normal, you will receive your results only by: [place] (if you have MyChart) OR A paper copy in the mail If you have any lab test that is abnormal or we need to change your treatment, we will call you to review the results.   Testing/Procedures:    Follow-Up: At [facility], you and your health needs are our priority.  As part of our continuing mission to provide you with exceptional heart care, we have created designated Provider Care Teams.  These Care Teams include your primary Cardiologist (physician) and Advanced Practice Providers (APPs -  Physician Assistants and Nurse Practitioners) who all work together to provide you with the care you need, when you need it.  We recommend signing up for the patient portal called "MyChart".  Sign up information is provided on this After Visit Summary.  MyChart is used to connect with patients for Virtual Visits (Telemedicine).  Patients are able to view lab/test results, encounter notes, upcoming appointments, etc.  Non-urgent messages can be sent to your provider as well.   To learn more about what you can do with MyChart, go to [email or web address].    Your next appointment:   2-3 month(s)  Provider:   Dr Dorris Carnes    Other Instructions

## 2022-09-16 ENCOUNTER — Encounter: Payer: Self-pay | Admitting: Internal Medicine

## 2022-09-16 ENCOUNTER — Ambulatory Visit (INDEPENDENT_AMBULATORY_CARE_PROVIDER_SITE_OTHER): Payer: Medicare Other | Admitting: Internal Medicine

## 2022-09-16 VITALS — BP 114/60 | HR 62 | Temp 97.1°F | Ht 66.0 in | Wt 146.0 lb

## 2022-09-16 DIAGNOSIS — N1831 Chronic kidney disease, stage 3a: Secondary | ICD-10-CM | POA: Diagnosis not present

## 2022-09-16 DIAGNOSIS — K219 Gastro-esophageal reflux disease without esophagitis: Secondary | ICD-10-CM | POA: Diagnosis not present

## 2022-09-16 DIAGNOSIS — F39 Unspecified mood [affective] disorder: Secondary | ICD-10-CM | POA: Diagnosis not present

## 2022-09-16 DIAGNOSIS — I5042 Chronic combined systolic (congestive) and diastolic (congestive) heart failure: Secondary | ICD-10-CM | POA: Diagnosis not present

## 2022-09-16 DIAGNOSIS — I25119 Atherosclerotic heart disease of native coronary artery with unspecified angina pectoris: Secondary | ICD-10-CM

## 2022-09-16 DIAGNOSIS — N2581 Secondary hyperparathyroidism of renal origin: Secondary | ICD-10-CM

## 2022-09-16 DIAGNOSIS — I2510 Atherosclerotic heart disease of native coronary artery without angina pectoris: Secondary | ICD-10-CM | POA: Insufficient documentation
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
## 2022-09-16 NOTE — Assessment & Plan Note (Signed)
EF lower again Continue current meds for now since symptoms stable Getting to know Dr Janalyn Rouse may want to titrate for GDMT

## 2022-09-16 NOTE — Assessment & Plan Note (Signed)
Continues on the vitamin D

## 2022-09-16 NOTE — Assessment & Plan Note (Signed)
Holiday blues and seasonal symptoms Continues on the citalopram and lorazepam (for sleep) No MDD

## 2022-09-16 NOTE — Assessment & Plan Note (Signed)
Symptoms gone on the pantoprazole

## 2022-09-16 NOTE — Assessment & Plan Note (Signed)
GFR stabilized at higher level Continues on the lisinopril

## 2022-09-16 NOTE — Assessment & Plan Note (Signed)
Discussed that if her symptoms become unstable---like occur totally at rest and nitro doesn't help--then she should call 911 and cath would be indicated (otherwise I don't think intervention needed) Continue the plavix, lisinopril 10, metoprolol 12.5 daily

## 2022-09-28 ENCOUNTER — Other Ambulatory Visit: Payer: Self-pay

## 2022-09-28 ENCOUNTER — Other Ambulatory Visit: Payer: Self-pay | Admitting: Internal Medicine

## 2022-09-28 MED ORDER — METOPROLOL SUCCINATE ER 25 MG PO TB24: 12.5000 mg | ORAL_TABLET | Freq: Every day | ORAL | 3 refills | Status: DC

## 2022-09-28 NOTE — Telephone Encounter (Signed)
Refill request for LORAZEPAM 1 MG TABLET   LOV - 09/16/22 Next OV - 06/20/23 Last refill - 09/01/22 #30/0

## 2022-10-07 NOTE — Progress Notes (Signed)
Entered in error

## 2022-10-14 ENCOUNTER — Telehealth: Payer: Self-pay

## 2022-10-14 ENCOUNTER — Other Ambulatory Visit: Payer: Self-pay

## 2022-10-14 MED ORDER — DAPAGLIFLOZIN PROPANEDIOL 10 MG PO TABS: 10.0000 mg | ORAL_TABLET | Freq: Every day | ORAL | 3 refills | Status: AC

## 2022-10-14 NOTE — Telephone Encounter (Signed)
**Note De-Identified Draycen Leichter Obfuscation** We received a [place] RX form from [facility]. I have completed the form and have e-mailed it to Dr Harrington Challenger' nurse so she can obtain her signature, date it, and to fax it back to [facility] at the fax number circled at the top of the RX form.  I also e-scribed the pts Farxiga 10 mg #90 with 3 refills to [facility] ([facility] [place] [place]) as Dr Harrington Challenger will not be in office again until 3/11 and so [facility] can fill until the hard copy of the pts RX form is faxed back to them on 3/11.

## 2022-10-17 ENCOUNTER — Other Ambulatory Visit: Payer: Self-pay | Admitting: Internal Medicine

## 2022-10-18 NOTE — Telephone Encounter (Signed)
Forms received. Will have Dr Harrington Challenger sign today.

## 2022-10-19 NOTE — Telephone Encounter (Signed)
Left message on VM to call back. This is not on her med list any longer. The syringes to draw them up are, but the B12 medication is not. Is she still doing the B12 injections at home?

## 2022-10-19 NOTE — Telephone Encounter (Signed)
Spoke to pt. She said she does do a B12 injection monthly. I sent that in to the pharmacy.   She said that she has not been feeling well lately. She checked her BP yesterday and the top number was in the 70s. Said it finally came up to 90s/?. Still not feeling the best. Asking what she should do, if anything. Still has no energy.

## 2022-10-19 NOTE — Telephone Encounter (Signed)
Called and spoke to pt

## 2022-10-19 NOTE — Telephone Encounter (Signed)
Forms signed and faxed to the given fax number with fax OK report.

## 2022-11-03 ENCOUNTER — Other Ambulatory Visit: Payer: Self-pay | Admitting: Internal Medicine
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
## 2022-11-09 ENCOUNTER — Encounter: Payer: Self-pay | Admitting: Internal Medicine

## 2022-11-09 ENCOUNTER — Ambulatory Visit: Payer: Medicare Other | Attending: Internal Medicine | Admitting: Internal Medicine

## 2022-11-09 VITALS — BP 148/72 | HR 57 | Ht 66.0 in | Wt 152.2 lb

## 2022-11-09 DIAGNOSIS — I5022 Chronic systolic (congestive) heart failure: Secondary | ICD-10-CM | POA: Insufficient documentation

## 2022-11-09 DIAGNOSIS — Z8673 Personal history of transient ischemic attack (TIA), and cerebral infarction without residual deficits: Secondary | ICD-10-CM | POA: Insufficient documentation

## 2022-11-09 DIAGNOSIS — I10 Essential (primary) hypertension: Secondary | ICD-10-CM | POA: Diagnosis not present

## 2022-11-09 DIAGNOSIS — I447 Left bundle-branch block, unspecified: Secondary | ICD-10-CM

## 2022-11-09 DIAGNOSIS — I5032 Chronic diastolic (congestive) heart failure: Secondary | ICD-10-CM | POA: Diagnosis not present

## 2022-11-09 MED ORDER — ATORVASTATIN CALCIUM 10 MG PO TABS: 10.0000 mg | ORAL_TABLET | Freq: Every day | ORAL | 3 refills | Status: DC

## 2022-11-09 NOTE — Patient Instructions (Signed)
Medication Instructions:  START LIPITOR 10 MG DAILY   *If you need a refill on your cardiac medications before your next appointment, please call your pharmacy*   Lab Work:  If you have labs (blood work) drawn today and your tests are completely normal, you will receive your results only by: [place] (if you have MyChart) OR A paper copy in the mail If you have any lab test that is abnormal or we need to change your treatment, we will call you to review the results.   Testing/Procedures: Your physician has requested that you have an echocardiogram. Echocardiography is a painless test that uses sound waves to create images of your heart. It provides your doctor with information about the size and shape of your heart and how well your heart's chambers and valves are working. This procedure takes approximately one hour. There are no restrictions for this procedure. Please do NOT wear cologne, perfume, aftershave, or lotions (deodorant is allowed). Please arrive 15 minutes prior to your appointment time.    Follow-Up: BP NURSE VISIT WITH HER BP CUFF THE DAY OF THE ECHO   At [facility], you and your health needs are our priority.  As part of our continuing mission to provide you with exceptional heart care, we have created designated Provider Care Teams.  These Care Teams include your primary Cardiologist (physician) and Advanced Practice Providers (APPs -  Physician Assistants and Nurse Practitioners) who all work together to provide you with the care you need, when you need it.  We recommend signing up for the patient portal called "MyChart".  Sign up information is provided on this After Visit Summary.  MyChart is used to connect with patients for Virtual Visits (Telemedicine).  Patients are able to view lab/test results, encounter notes, upcoming appointments, etc.  Non-urgent messages can be sent to your provider as well.   To learn more about what you can do with MyChart,  go to [email or web address].    Your next appointment:   3 month(s)  Provider:   DR Dorris Carnes    Other Instructions

## 2022-11-10 ENCOUNTER — Telehealth: Payer: Self-pay | Admitting: Internal Medicine

## 2022-11-10 NOTE — Telephone Encounter (Signed)
Recent cardiology note is in her chart.

## 2022-11-10 NOTE — Telephone Encounter (Signed)
Pt's niece stated that the pt would like to do a provider switch from Dr. Harrington Challenger to Dr. Burt Knack. Would both of you be okay with the provider switch?

## 2022-11-10 NOTE — Telephone Encounter (Signed)
Patient would like a phone call regarding her b/p going up and down.Her heart doctor prescribed her Lipitor,but she would like to know if Dr Silvio Pate think this is a good idea,for her to be on this medication?

## 2022-11-11 ENCOUNTER — Other Ambulatory Visit: Payer: Self-pay | Admitting: Internal Medicine

## 2022-11-11 NOTE — Telephone Encounter (Signed)
Patient called in to follow up. Informed message was sent to Dr. Silvio Pate, just waiting on response.

## 2022-11-11 NOTE — Telephone Encounter (Signed)
Discussed with her both issues  I took the metoprolol off her list---with those very low numbers at times, I think ir really increases her risk for falls.  I told her that there has been more recent data on cholesterol meds in older folks--and there might be some benefit. I thought she should try the atorvastatin--but just stop if any problems (like GI issues or muscle aches)

## 2022-11-22 ENCOUNTER — Telehealth: Payer: Self-pay

## 2022-11-22 NOTE — Telephone Encounter (Signed)
Received a note from patient's pharmacy benefits that she has not filled her lisinopril since Aug 18, 2022. I called her and she stated she has not missed any doses and had 10 more. She said it was at the pharmacy and was going to get it in the next few days.

## 2022-11-23 ENCOUNTER — Encounter: Payer: Self-pay | Admitting: Physician Assistant

## 2022-11-23 ENCOUNTER — Ambulatory Visit: Payer: Medicare Other | Attending: Physician Assistant | Admitting: Physician Assistant

## 2022-11-23 VITALS — BP 120/60 | HR 67 | Ht 66.0 in | Wt 152.0 lb

## 2022-11-23 DIAGNOSIS — R0989 Other specified symptoms and signs involving the circulatory and respiratory systems: Secondary | ICD-10-CM | POA: Diagnosis not present

## 2022-11-23 DIAGNOSIS — I25119 Atherosclerotic heart disease of native coronary artery with unspecified angina pectoris: Secondary | ICD-10-CM | POA: Diagnosis not present

## 2022-11-23 DIAGNOSIS — I5020 Unspecified systolic (congestive) heart failure: Secondary | ICD-10-CM

## 2022-11-23 DIAGNOSIS — I6523 Occlusion and stenosis of bilateral carotid arteries: Secondary | ICD-10-CM | POA: Insufficient documentation

## 2022-11-23 DIAGNOSIS — I351 Nonrheumatic aortic (valve) insufficiency: Secondary | ICD-10-CM | POA: Insufficient documentation

## 2022-11-23 DIAGNOSIS — I10 Essential (primary) hypertension: Secondary | ICD-10-CM | POA: Diagnosis not present

## 2022-11-23 NOTE — Assessment & Plan Note (Signed)
She has a very prominent left internal carotid artery on exam.  She also has a bruit.  She has not had carotid Doppler since 2019.  Obtain follow-up carotid ultrasound.

## 2022-11-23 NOTE — Progress Notes (Addendum)
Cardiology Office Note:    Date:  11/23/2022  ID:  Burnadette Peter, DOB [birth year], MRN [id number] PCP: Karie Schwalbe, MD  [facility] Providers Cardiologist:  Tonny Bollman, MD      Patient Profile:   (HFrEF) heart failure with reduced ejection fraction  Previously HFpEF Non-ischemic cardiomyopathy  LAD 04/22/2006: EF 35-45, no CAD TTE 06/17/2019: EF 30-35, moderate LVH, normal RVSF, moderate LAE, mild MR, mild TR, moderate AI, AV sclerosis, mild PI, mildly elevated PASP, RVSP 30.2 Coronary artery Ca2+ Left Bundle Branch Block Bradycardia  Hx of CVA Carotid US 10/27/2017: Bilateral ICA 1-39 Hypertension  Hyperlipidemia  Hypothyroidism     History of Present Illness:   Leslie [name] is a 89 y.o. female is for follow-up on CHF.  She is a prior patient of Dr. Katrinka Blazing. She established with Dr. Tenny Craw in Jan 2024. Her last OV was 11/09/22.  A f/u echocardiogram was ordered but is still pending. She has requested switching to Dr. Excell Seltzer.  She is here with her niece.  She had previously reported chest pain to Dr. Tenny Craw back in January.  She has not really noticed any recurrent symptoms since then.  She described substernal chest pressure and left arm discomfort.  This would occur hours after exertion.  Her niece notes that she is very active.  She has not had any exertional symptoms.  She does note dyspnea with exertion.  This has been ongoing for years without significant change.  She sleeps on an incline chronically.  She has not had lower extremity edema or syncope.  She is having difficulty with fatigue.  Her PCP stopped her beta-blocker.  Since then, she has felt better.  She also never started on atorvastatin.  ROS see HPI    Studies Reviewed:    EKG: NSR, HR 67, left bundle branch block   Risk Assessment/Calculations:             Physical Exam:   VS:  BP 120/60   Pulse 67   Ht 5\' 6"  (1.676 m)   Wt 152 lb (68.9 kg)   SpO2 96%   BMI 24.53 kg/m    Wt Readings from Last 3  Encounters:  11/23/22 152 lb (68.9 kg)  11/09/22 152 lb 3.2 oz (69 kg)  09/16/22 146 lb (66.2 kg)    Constitutional:      Appearance: Healthy appearance. Not in distress.  Neck:     Vascular: Carotid bruit (L side; Carotid artery prominent (?aneurysmal)) present. JVD normal.  Pulmonary:     Breath sounds: Normal breath sounds. No wheezing. No rales.  Cardiovascular:     Normal rate. Regular rhythm. Normal S1. Normal S2.      Murmurs: There is a grade 2/6 systolic murmur at the LLSB.  Edema:    Peripheral edema absent.  Abdominal:     Palpations: Abdomen is soft.       ASSESSMENT AND PLAN:   Atherosclerotic heart disease of native coronary artery with angina pectoris [facility]) She had a cardiac catheterization 2007 that demonstrated no CAD.  She has had CT scan in the recent past with coronary artery calcification.  She has noted some chest discomfort and left arm discomfort in the past.  We had a long discussion today regarding whether or not to proceed with further testing versus medical therapy.  I did offer to perform stress testing.  We ultimately decided to continue to consider this.  She will let me know if she changes  her mind and wants to proceed.  She knows to take nitroglycerin and notify us if she should have exertional symptoms.  Plan follow-up in 3 months with me in 6 months with Dr. Excell Seltzer.  Continue Plavix 75 mg daily, nitroglycerin as needed.  HFrEF (heart failure with reduced ejection fraction) EF 30-35 by echocardiogram in November 2020.  Repeat echocardiogram is pending.  She is NYHA II-IIb.  Volume status is stable.  We discussed the 4 pillars of HFrEF management.  She could not tolerate beta-blocker secondary to fatigue.  At this point, I am not certain that her blood pressure would be able to tolerate the transition from ACE inhibitor to [facility].  She has not required diuretic therapy.  I recommend continuing lisinopril 10 mg daily, Farxiga 10 mg daily.  As noted,  follow-up with me in 3 months and Dr. Excell Seltzer in 6 months.  Aortic insufficiency Echocardiogram in 2020 with moderate AI and mild MR.  Repeat echocardiogram is pending.  HTN (hypertension) Blood pressure seems to be better controlled since stopping her metoprolol.  Continue lisinopril 10 mg daily.  Carotid artery disease (HCC) She has a very prominent left internal carotid artery on exam.  She also has a bruit.  She has not had carotid Doppler since 2019.  Obtain follow-up carotid ultrasound.        Dispo:  Return in about 3 months (around 02/22/2023) for Routine Follow Up, w/ Tereso Newcomer, PA-C.  Signed, Tereso Newcomer, PA-C

## 2022-11-23 NOTE — Assessment & Plan Note (Signed)
Echocardiogram in 2020 with moderate AI and mild MR.  Repeat echocardiogram is pending.

## 2022-11-23 NOTE — Assessment & Plan Note (Signed)
Blood pressure seems to be better controlled since stopping her metoprolol.  Continue lisinopril 10 mg daily.

## 2022-11-23 NOTE — Assessment & Plan Note (Signed)
EF 30-35 by echocardiogram in November 2020.  Repeat echocardiogram is pending.  She is NYHA II-IIb.  Volume status is stable.  We discussed the 4 pillars of HFrEF management.  She could not tolerate beta-blocker secondary to fatigue.  At this point, I am not certain that her blood pressure would be able to tolerate the transition from ACE inhibitor to [facility].  She has not required diuretic therapy.  I recommend continuing lisinopril 10 mg daily, Farxiga 10 mg daily.  As noted, follow-up with me in 3 months and Dr. Excell Seltzer in 6 months.

## 2022-11-23 NOTE — Assessment & Plan Note (Signed)
She had a cardiac catheterization 2007 that demonstrated no CAD.  She has had CT scan in the recent past with coronary artery calcification.  She has noted some chest discomfort and left arm discomfort in the past.  We had a long discussion today regarding whether or not to proceed with further testing versus medical therapy.  I did offer to perform stress testing.  We ultimately decided to continue to consider this.  She will let me know if she changes her mind and wants to proceed.  She knows to take nitroglycerin and notify us if she should have exertional symptoms.  Plan follow-up in 3 months with me in 6 months with Dr. Excell Seltzer.  Continue Plavix 75 mg daily, nitroglycerin as needed.

## 2022-11-23 NOTE — Patient Instructions (Signed)
Medication Instructions:  Your physician recommends that you continue on your current medications as directed. Please refer to the Current Medication list given to you today.  *If you need a refill on your cardiac medications before your next appointment, please call your pharmacy*   Lab Work: None ordered  If you have labs (blood work) drawn today and your tests are completely normal, you will receive your results only by: MyChart Message (if you have MyChart) OR A paper copy in the mail If you have any lab test that is abnormal or we need to change your treatment, we will call you to review the results.   Testing/Procedures: Your physician has requested that you have a carotid duplex. This test is an ultrasound of the carotid arteries in your neck. It looks at blood flow through these arteries that supply the brain with blood. Allow one hour for this exam. There are no restrictions or special instructions.    Follow-Up: At [facility], you and your health needs are our priority.  As part of our continuing mission to provide you with exceptional heart care, we have created designated Provider Care Teams.  These Care Teams include your primary Cardiologist (physician) and Advanced Practice Providers (APPs -  Physician Assistants and Nurse Practitioners) who all work together to provide you with the care you need, when you need it.  We recommend signing up for the patient portal called "MyChart".  Sign up information is provided on this After Visit Summary.  MyChart is used to connect with patients for Virtual Visits (Telemedicine).  Patients are able to view lab/test results, encounter notes, upcoming appointments, etc.  Non-urgent messages can be sent to your provider as well.   To learn more about what you can do with MyChart, go to [email or web address].    Your next appointment:   3 month(s)  6 month(s) Provider:   Tereso Newcomer, PA-C         Lesleigh Noe, MD  (Inactive)    Other Instructions

## 2022-12-02 ENCOUNTER — Other Ambulatory Visit: Payer: Self-pay | Admitting: Internal Medicine

## 2022-12-02 NOTE — Telephone Encounter (Signed)
Last filled 11-03-22 #30 Last OV 10-06-22 Next OV 06-20-23 [facility]

## 2022-12-06 ENCOUNTER — Ambulatory Visit (HOSPITAL_COMMUNITY)
Admission: RE | Admit: 2022-12-06 | Discharge: 2022-12-06 | Disposition: A | Discharge status: Home / Self Care | Payer: Medicare Other | Source: Ambulatory Visit | Attending: Physician Assistant | Admitting: Physician Assistant

## 2022-12-06 DIAGNOSIS — I5020 Unspecified systolic (congestive) heart failure: Secondary | ICD-10-CM

## 2022-12-06 DIAGNOSIS — R0989 Other specified symptoms and signs involving the circulatory and respiratory systems: Secondary | ICD-10-CM

## 2022-12-09 ENCOUNTER — Telehealth: Payer: Self-pay | Admitting: Cardiovascular Disease

## 2022-12-09 DIAGNOSIS — I6523 Occlusion and stenosis of bilateral carotid arteries: Secondary | ICD-10-CM

## 2022-12-09 NOTE — Telephone Encounter (Signed)
Patient returned CMA's call regarding results. 

## 2022-12-13 NOTE — Telephone Encounter (Signed)
-----   Message from Beatrice Lecher, [place] sent at 12/07/2022  5:20 PM EDT ----- Mild bilateral carotid artery plaque. The R sided artery in the neck (innominate) is more prominent, but the size is stable compared to the prior ultrasound. I will send a copy to her PCP as an FYI. PLAN:  - Continue current medications/treatment plan and follow up as scheduled.  - Repeat Carotid US in 1 year.  Tereso Newcomer, PA-C    12/07/2022 5:14 PM

## 2022-12-13 NOTE — Telephone Encounter (Signed)
Pt returning call

## 2022-12-13 NOTE — Telephone Encounter (Signed)
Returned call to pt, left another message for pt to call back.  

## 2022-12-20 ENCOUNTER — Ambulatory Visit: Payer: Medicare Other | Attending: Cardiology

## 2022-12-20 ENCOUNTER — Ambulatory Visit: Payer: Medicare Other

## 2022-12-20 DIAGNOSIS — Z8673 Personal history of transient ischemic attack (TIA), and cerebral infarction without residual deficits: Secondary | ICD-10-CM | POA: Diagnosis not present

## 2022-12-20 DIAGNOSIS — I5031 Acute diastolic (congestive) heart failure: Secondary | ICD-10-CM

## 2022-12-20 DIAGNOSIS — I5032 Chronic diastolic (congestive) heart failure: Secondary | ICD-10-CM | POA: Diagnosis not present

## 2022-12-20 DIAGNOSIS — I447 Left bundle-branch block, unspecified: Secondary | ICD-10-CM | POA: Insufficient documentation

## 2022-12-20 DIAGNOSIS — I10 Essential (primary) hypertension: Secondary | ICD-10-CM | POA: Diagnosis not present

## 2022-12-20 DIAGNOSIS — I5022 Chronic systolic (congestive) heart failure: Secondary | ICD-10-CM | POA: Diagnosis not present

## 2022-12-20 LAB — ECHOCARDIOGRAM COMPLETE
Area-P 1/2: 3.08 cm2
P 1/2 time: 411 msec
S' Lateral: 3.43 cm

## 2022-12-27 ENCOUNTER — Telehealth: Payer: Self-pay

## 2022-12-27 NOTE — Patient Outreach (Signed)
  Care Coordination   12/27/2022 Name: Leslie [name] MRN: [id number] DOB: 10-13-1930   Care Coordination Outreach Attempts:  An unsuccessful telephone outreach was attempted today to offer the patient information about available care coordination services. HIPAA compliant message left with call back phone number.   Follow Up Plan:  Additional outreach attempts will be made to offer the patient care coordination information and services.   Encounter Outcome:  No Answer   Care Coordination Interventions:  No, not indicated    George Ina [facility] Care Coordination [phone] direct line

## 2023-01-06 ENCOUNTER — Other Ambulatory Visit: Payer: Self-pay | Admitting: Internal Medicine

## 2023-01-06 ENCOUNTER — Telehealth: Payer: Self-pay | Admitting: Internal Medicine

## 2023-01-06 NOTE — Telephone Encounter (Signed)
Spoke to pt. She will try an OTC vaginal yeast medication. If it doe snot help, she will call for a referral.

## 2023-01-06 NOTE — Telephone Encounter (Signed)
Last filled 12-02-22 #30 Last OV 10-06-22 Next OV 06-20-23 [facility]

## 2023-01-06 NOTE — Telephone Encounter (Signed)
Patient contacted the office regarding symptoms she is having, states she is having issues with vaginal itching/ irritation, says it may be due to the pads she is using for leakage. Patient asked if there is anything Dr. Alphonsus Sias might recommend or could prescribe for her to use for this. Informed patient I would send this message but Dr. Alphonsus Sias may want to see patient in office regarding this before prescribing anything. Please advise, thank you.

## 2023-01-13 DIAGNOSIS — M7989 Other specified soft tissue disorders: Secondary | ICD-10-CM | POA: Diagnosis not present

## 2023-01-13 DIAGNOSIS — S93402A Sprain of unspecified ligament of left ankle, initial encounter: Secondary | ICD-10-CM | POA: Diagnosis not present

## 2023-01-13 DIAGNOSIS — S93602A Unspecified sprain of left foot, initial encounter: Secondary | ICD-10-CM | POA: Diagnosis not present

## 2023-01-13 DIAGNOSIS — M25572 Pain in left ankle and joints of left foot: Secondary | ICD-10-CM | POA: Diagnosis not present

## 2023-01-13 DIAGNOSIS — M79672 Pain in left foot: Secondary | ICD-10-CM | POA: Diagnosis not present

## 2023-01-13 DIAGNOSIS — M25472 Effusion, left ankle: Secondary | ICD-10-CM | POA: Diagnosis not present

## 2023-01-21 DIAGNOSIS — S93402A Sprain of unspecified ligament of left ankle, initial encounter: Secondary | ICD-10-CM | POA: Diagnosis not present

## 2023-01-21 DIAGNOSIS — S93402D Sprain of unspecified ligament of left ankle, subsequent encounter: Secondary | ICD-10-CM | POA: Diagnosis not present

## 2023-02-07 ENCOUNTER — Other Ambulatory Visit: Payer: Self-pay | Admitting: Internal Medicine

## 2023-02-08 NOTE — Telephone Encounter (Signed)
Last filled 01-06-23 #30 Last OV 10-06-22 Next OV 06-20-23 [facility]

## 2023-02-09 ENCOUNTER — Ambulatory Visit: Payer: Medicare Other | Admitting: Internal Medicine

## 2023-02-13 ENCOUNTER — Observation Stay (HOSPITAL_COMMUNITY)
Admission: EM | Admit: 2023-02-13 | Discharge: 2023-02-14 | Disposition: A | Discharge status: Home / Self Care | Payer: Medicare Other | Attending: Internal Medicine | Admitting: Internal Medicine

## 2023-02-13 ENCOUNTER — Emergency Department (HOSPITAL_COMMUNITY): Payer: Medicare Other

## 2023-02-13 ENCOUNTER — Encounter (HOSPITAL_COMMUNITY): Payer: Self-pay | Admitting: *Deleted

## 2023-02-13 ENCOUNTER — Other Ambulatory Visit: Payer: Self-pay

## 2023-02-13 DIAGNOSIS — Z96651 Presence of right artificial knee joint: Secondary | ICD-10-CM | POA: Diagnosis not present

## 2023-02-13 DIAGNOSIS — K2100 Gastro-esophageal reflux disease with esophagitis, without bleeding: Secondary | ICD-10-CM | POA: Diagnosis not present

## 2023-02-13 DIAGNOSIS — Z96653 Presence of artificial knee joint, bilateral: Secondary | ICD-10-CM | POA: Insufficient documentation

## 2023-02-13 DIAGNOSIS — E039 Hypothyroidism, unspecified: Secondary | ICD-10-CM | POA: Diagnosis not present

## 2023-02-13 DIAGNOSIS — F320 Major depressive disorder, single episode, mild: Secondary | ICD-10-CM | POA: Diagnosis present

## 2023-02-13 DIAGNOSIS — Z8673 Personal history of transient ischemic attack (TIA), and cerebral infarction without residual deficits: Secondary | ICD-10-CM

## 2023-02-13 DIAGNOSIS — I130 Hypertensive heart and chronic kidney disease with heart failure and stage 1 through stage 4 chronic kidney disease, or unspecified chronic kidney disease: Secondary | ICD-10-CM | POA: Insufficient documentation

## 2023-02-13 DIAGNOSIS — M7989 Other specified soft tissue disorders: Secondary | ICD-10-CM | POA: Diagnosis not present

## 2023-02-13 DIAGNOSIS — Z7902 Long term (current) use of antithrombotics/antiplatelets: Secondary | ICD-10-CM | POA: Insufficient documentation

## 2023-02-13 DIAGNOSIS — R58 Hemorrhage, not elsewhere classified: Secondary | ICD-10-CM | POA: Diagnosis not present

## 2023-02-13 DIAGNOSIS — W5532XA Struck by other hoof stock, initial encounter: Secondary | ICD-10-CM | POA: Diagnosis not present

## 2023-02-13 DIAGNOSIS — I959 Hypotension, unspecified: Secondary | ICD-10-CM | POA: Diagnosis not present

## 2023-02-13 DIAGNOSIS — I25119 Atherosclerotic heart disease of native coronary artery with unspecified angina pectoris: Secondary | ICD-10-CM | POA: Diagnosis not present

## 2023-02-13 DIAGNOSIS — I5020 Unspecified systolic (congestive) heart failure: Secondary | ICD-10-CM | POA: Diagnosis present

## 2023-02-13 DIAGNOSIS — I5042 Chronic combined systolic (congestive) and diastolic (congestive) heart failure: Secondary | ICD-10-CM | POA: Insufficient documentation

## 2023-02-13 DIAGNOSIS — N1831 Chronic kidney disease, stage 3a: Secondary | ICD-10-CM | POA: Diagnosis not present

## 2023-02-13 DIAGNOSIS — S81801A Unspecified open wound, right lower leg, initial encounter: Secondary | ICD-10-CM | POA: Diagnosis present

## 2023-02-13 DIAGNOSIS — I10 Essential (primary) hypertension: Secondary | ICD-10-CM | POA: Diagnosis present

## 2023-02-13 DIAGNOSIS — K219 Gastro-esophageal reflux disease without esophagitis: Secondary | ICD-10-CM | POA: Diagnosis present

## 2023-02-13 DIAGNOSIS — I2510 Atherosclerotic heart disease of native coronary artery without angina pectoris: Secondary | ICD-10-CM | POA: Diagnosis present

## 2023-02-13 DIAGNOSIS — N179 Acute kidney failure, unspecified: Secondary | ICD-10-CM | POA: Diagnosis not present

## 2023-02-13 DIAGNOSIS — I6523 Occlusion and stenosis of bilateral carotid arteries: Secondary | ICD-10-CM

## 2023-02-13 DIAGNOSIS — R42 Dizziness and giddiness: Secondary | ICD-10-CM | POA: Diagnosis not present

## 2023-02-13 DIAGNOSIS — Z7984 Long term (current) use of oral hypoglycemic drugs: Secondary | ICD-10-CM | POA: Diagnosis not present

## 2023-02-13 DIAGNOSIS — Z79899 Other long term (current) drug therapy: Secondary | ICD-10-CM | POA: Diagnosis not present

## 2023-02-13 DIAGNOSIS — W5582XA Struck by other mammals, initial encounter: Secondary | ICD-10-CM | POA: Insufficient documentation

## 2023-02-13 DIAGNOSIS — S81811A Laceration without foreign body, right lower leg, initial encounter: Secondary | ICD-10-CM

## 2023-02-13 DIAGNOSIS — F39 Unspecified mood [affective] disorder: Secondary | ICD-10-CM | POA: Diagnosis not present

## 2023-02-13 DIAGNOSIS — I5022 Chronic systolic (congestive) heart failure: Secondary | ICD-10-CM | POA: Diagnosis present

## 2023-02-13 DIAGNOSIS — I779 Disorder of arteries and arterioles, unspecified: Secondary | ICD-10-CM | POA: Diagnosis present

## 2023-02-13 LAB — CBC WITH DIFFERENTIAL/PLATELET
Abs Immature Granulocytes: 0.03 10*3/uL (ref 0.00–0.07)
Basophils Absolute: 0.1 10*3/uL (ref 0.0–0.1)
Basophils Relative: 1 %
Eosinophils Absolute: 0.2 10*3/uL (ref 0.0–0.5)
Eosinophils Relative: 3 %
HCT: 46.8 % — ABNORMAL HIGH (ref 36.0–46.0)
Hemoglobin: 15 g/dL (ref 12.0–15.0)
Immature Granulocytes: 0 %
Lymphocytes Relative: 22 %
Lymphs Abs: 1.7 10*3/uL (ref 0.7–4.0)
MCH: 30.1 pg (ref 26.0–34.0)
MCHC: 32.1 g/dL (ref 30.0–36.0)
MCV: 94 fL (ref 80.0–100.0)
Monocytes Absolute: 0.5 10*3/uL (ref 0.1–1.0)
Monocytes Relative: 7 %
Neutro Abs: 4.9 10*3/uL (ref 1.7–7.7)
Neutrophils Relative %: 67 %
Platelets: 230 10*3/uL (ref 150–400)
RBC: 4.98 MIL/uL (ref 3.87–5.11)
RDW: 13.7 % (ref 11.5–15.5)
WBC: 7.4 10*3/uL (ref 4.0–10.5)
nRBC: 0 % (ref 0.0–0.2)

## 2023-02-13 LAB — BASIC METABOLIC PANEL
Anion gap: 10 (ref 5–15)
BUN: 16 mg/dL (ref 8–23)
CO2: 24 mmol/L (ref 22–32)
Calcium: 9.6 mg/dL (ref 8.9–10.3)
Chloride: 103 mmol/L (ref 98–111)
Creatinine, Ser: 1.24 mg/dL — ABNORMAL HIGH (ref 0.44–1.00)
GFR, Estimated: 41 mL/min — ABNORMAL LOW (ref >60)
Glucose, Bld: 124 mg/dL — ABNORMAL HIGH (ref 70–99)
Potassium: 4.5 mmol/L (ref 3.5–5.1)
Sodium: 137 mmol/L (ref 135–145)

## 2023-02-13 MED ORDER — FENTANYL CITRATE PF 50 MCG/ML IJ SOSY
50.0000 ug | PREFILLED_SYRINGE | Freq: Once | INTRAMUSCULAR | Status: AC
  Administered 2023-02-13: 50 ug via INTRAVENOUS
  Filled 2023-02-13: qty 1

## 2023-02-13 MED ORDER — PANTOPRAZOLE SODIUM 40 MG PO TBEC
40.0000 mg | DELAYED_RELEASE_TABLET | Freq: Every day | ORAL | Status: DC
  Administered 2023-02-14: 40 mg via ORAL
  Filled 2023-02-13: qty 1

## 2023-02-13 MED ORDER — LISINOPRIL 10 MG PO TABS
10.0000 mg | ORAL_TABLET | Freq: Every day | ORAL | Status: DC
  Filled 2023-02-13: qty 1

## 2023-02-13 MED ORDER — LEVOTHYROXINE SODIUM 75 MCG PO TABS
75.0000 ug | ORAL_TABLET | Freq: Every day | ORAL | Status: DC
  Administered 2023-02-14: 75 ug via ORAL
  Filled 2023-02-13: qty 1

## 2023-02-13 MED ORDER — LIDOCAINE-EPINEPHRINE (PF) 2 %-1:200000 IJ SOLN
20.0000 mL | Freq: Once | INTRAMUSCULAR | Status: AC
  Administered 2023-02-13: 20 mL
  Filled 2023-02-13: qty 20

## 2023-02-13 MED ORDER — POLYVINYL ALCOHOL-POVIDONE PF 1.4-0.6 % OP SOLN: 1.0000 [drp] | Freq: Three times a day (TID) | OPHTHALMIC | Status: DC | PRN

## 2023-02-13 MED ORDER — SODIUM CHLORIDE 0.9 % IV BOLUS
500.0000 mL | Freq: Once | INTRAVENOUS | Status: AC
  Administered 2023-02-13: 500 mL via INTRAVENOUS

## 2023-02-13 MED ORDER — SODIUM CHLORIDE 0.9 % IV SOLN
3.0000 g | Freq: Once | INTRAVENOUS | Status: AC
  Administered 2023-02-13: 3 g via INTRAVENOUS
  Filled 2023-02-13: qty 8

## 2023-02-13 MED ORDER — CITALOPRAM HYDROBROMIDE 20 MG PO TABS
20.0000 mg | ORAL_TABLET | Freq: Every day | ORAL | Status: DC
  Administered 2023-02-14: 20 mg via ORAL
  Filled 2023-02-13: qty 1

## 2023-02-13 MED ORDER — POLYETHYLENE GLYCOL 3350 17 G PO PACK: 17.0000 g | PACK | Freq: Every day | ORAL | Status: DC | PRN

## 2023-02-13 MED ORDER — SODIUM CHLORIDE 0.9 % IV SOLN
3.0000 g | Freq: Four times a day (QID) | INTRAVENOUS | Status: DC
  Administered 2023-02-13 – 2023-02-14 (×3): 3 g via INTRAVENOUS
  Filled 2023-02-13 (×3): qty 8

## 2023-02-13 MED ORDER — ACETAMINOPHEN 325 MG PO TABS
650.0000 mg | ORAL_TABLET | Freq: Four times a day (QID) | ORAL | Status: DC | PRN
  Administered 2023-02-14: 650 mg via ORAL
  Filled 2023-02-13: qty 2

## 2023-02-13 MED ORDER — SODIUM CHLORIDE 0.9% FLUSH
3.0000 mL | Freq: Two times a day (BID) | INTRAVENOUS | Status: DC
  Administered 2023-02-13: 3 mL via INTRAVENOUS

## 2023-02-13 MED ORDER — CLOPIDOGREL BISULFATE 75 MG PO TABS
75.0000 mg | ORAL_TABLET | Freq: Every day | ORAL | Status: DC
  Administered 2023-02-14: 75 mg via ORAL
  Filled 2023-02-13: qty 1

## 2023-02-13 MED ORDER — LORAZEPAM 1 MG PO TABS
0.5000 mg | ORAL_TABLET | Freq: Every evening | ORAL | Status: DC | PRN
  Administered 2023-02-14: 1 mg via ORAL
  Filled 2023-02-13: qty 1

## 2023-02-13 MED ORDER — ACETAMINOPHEN 650 MG RE SUPP: 650.0000 mg | Freq: Four times a day (QID) | RECTAL | Status: DC | PRN

## 2023-02-13 MED ORDER — POLYVINYL ALCOHOL 1.4 % OP SOLN: 1.0000 [drp] | OPHTHALMIC | Status: DC | PRN

## 2023-02-13 NOTE — ED Notes (Signed)
Admitting MD at BS.  

## 2023-02-13 NOTE — ED Provider Notes (Signed)
[place] EMERGENCY DEPARTMENT AT [facility] Provider Note   CSN: [id number] Arrival date & time: 02/13/23  1429     History  Chief Complaint  Patient presents with   Leg Injury   Laceration    Leslie [name] is a 89 y.o. female.  With history of CKD, CHF, hypertension, previous stroke on Plavix, anxiety, depression, anemia who presents to the ED for evaluation of a right lower extremity wound.  She states she was outside with her goats when she turned around and one of the girls impaled her right lower extremity with a horn.  This occurred approximately 1 hour prior to arrival.  She reports significant blood loss at the scene.  EMS reported approximately 300 cc of blood loss.  She denies any dizziness or lightheadedness.  She denies any numbness, weakness or tingling.  Reports the pain at a 5 out of 10.  Last tetanus vaccine was 3 years ago   Laceration      Home Medications Prior to Admission medications   Medication Sig Start Date End Date Taking? Authorizing Provider  Calcium Carbonate-Vit D-Min (CALCIUM 1200 PO) Take 1 tablet by mouth daily.    [provider]  cholecalciferol (VITAMIN D3) 25 MCG (1000 UNIT) tablet Take 1,000 Units by mouth daily.    [provider]  citalopram (CELEXA) 20 MG tablet TAKE 1 TABLET BY MOUTH EVERY DAY 07/27/22   Tillman Abide I, MD  clopidogrel (PLAVIX) 75 MG tablet TAKE 1 TABLET BY MOUTH EVERY DAY 07/27/22   Tillman Abide I, MD  cyanocobalamin (VITAMIN B12) 1000 MCG/ML injection INJECT 1 ML (1,000 MCG) INTRAMUSCULARLY EVERY 30 DAYS 10/19/22   Karie Schwalbe, MD  dapagliflozin propanediol (FARXIGA) 10 MG TABS tablet Take 1 tablet (10 mg total) by mouth daily before breakfast. 10/14/22   Pricilla Riffle, MD  levothyroxine (SYNTHROID) 75 MCG tablet TAKE 1 TABLET BY MOUTH EVERY DAY 07/27/22   Tillman Abide I, MD  lisinopril (ZESTRIL) 10 MG tablet Take 1 tablet (10 mg total) by mouth daily. 05/19/22   Karie Schwalbe, MD  LORazepam (ATIVAN) 1 MG tablet TAKE 1/2 TO 1 TABLET BY MOUTH AT BEDTIME AS NEEDED 02/08/23   Worthy Rancher B, FNP  nitroGLYCERIN (NITROSTAT) 0.4 MG SL tablet Place 1 tablet (0.4 mg total) under the tongue every 5 (five) minutes as needed for chest pain. 09/06/22   Pricilla Riffle, MD  pantoprazole (PROTONIX) 40 MG tablet Take 1 tablet (40 mg total) by mouth daily. 06/16/22   Karie Schwalbe, MD  Polyvinyl Alcohol-Povidone (REFRESH OP) Place 1 drop into both eyes in the morning, at noon, in the evening, and at bedtime.    [provider]  SYRINGE-NEEDLE, DISP, 3 ML (BD INTEGRA SYRINGE) 25G X 1" 3 ML MISC USE 1 EVERY 30 (THIRTY) DAYS. WITH VITAMIN B12 INJECTION 01/26/22   Karie Schwalbe, MD      Allergies    Sulfa antibiotics, Cephalexin, Dilaudid [hydromorphone hcl], and Xopenex [levalbuterol hcl]    Review of Systems   Review of Systems  Physical Exam Updated Vital Signs BP (!) 142/60   Pulse 65   Temp 98.5 F (36.9 C) (Oral)   Resp 20   Wt 68.9 kg   SpO2 97%   BMI 24.53 kg/m  Physical Exam Vitals and nursing note reviewed.  Constitutional:      General: She is not in acute distress.    Appearance: Normal appearance. She is normal weight. She  is not ill-appearing.  HENT:     Head: Normocephalic and atraumatic.  Pulmonary:     Effort: Pulmonary effort is normal. No respiratory distress.  Abdominal:     General: Abdomen is flat.  Musculoskeletal:        General: Normal range of motion.     Cervical back: Neck supple.  Skin:    General: Skin is warm and dry.     Comments: Deep and gaping 9 cm wound to the right lateral lower leg. No active bleeding.   Neurological:     General: No focal deficit present.     Mental Status: She is alert and oriented to person, place, and time.  Psychiatric:        Mood and Affect: Mood normal.        Behavior: Behavior normal.     ED Results / Procedures / Treatments   Labs (all labs ordered are listed, but only abnormal  results are displayed) Labs Reviewed  CBC WITH DIFFERENTIAL/PLATELET - Abnormal; Notable for the following components:      Result Value   HCT 46.8 (*)    All other components within normal limits  BASIC METABOLIC PANEL - Abnormal; Notable for the following components:   Glucose, Bld 124 (*)    Creatinine, Ser 1.24 (*)    GFR, Estimated 41 (*)    All other components within normal limits  CBC  BASIC METABOLIC PANEL    EKG None  Radiology DG Tibia/Fibula Right  Result Date: 02/13/2023 CLINICAL DATA:  Goat horn injury. EXAM: RIGHT TIBIA AND FIBULA - 2 VIEW COMPARISON:  None Available. FINDINGS: Right knee arthroplasty. No fracture of the tibia or fibula, there is no periprosthetic fracture. Soft tissue edema and soft tissue gas about the upper lateral aspect of the lower leg. No radiopaque foreign body. There is scattered soft tissue phleboliths. IMPRESSION: Soft tissue edema and soft tissue gas about the upper lateral aspect of the lower leg. No radiopaque foreign body or acute osseous abnormality. Electronically Signed   By: Narda Rutherford M.D.   On: 02/13/2023 15:32    Procedures .Marland KitchenLaceration Repair  Date/Time: 02/13/2023 5:20 PM  Performed by: Michelle Piper, PA-C Authorized by: Michelle Piper, PA-C   Consent:    Consent obtained:  Verbal   Consent given by:  Patient   Risks discussed:  Infection, pain, poor cosmetic result and poor wound healing Universal protocol:    Procedure explained and questions answered to patient or proxy's satisfaction: yes     Relevant documents present and verified: yes     Test results available: yes     Imaging studies available: yes     Required blood products, implants, devices, and special equipment available: yes     Immediately prior to procedure, a time out was called: yes     Patient identity confirmed:  Verbally with patient and arm band Anesthesia:    Anesthesia method:  Local infiltration   Local anesthetic:  Lidocaine 2%  WITH epi Laceration details:    Location:  Leg   Leg location:  R lower leg   Length (cm):  9 Pre-procedure details:    Preparation:  Patient was prepped and draped in usual sterile fashion and imaging obtained to evaluate for foreign bodies Exploration:    Hemostasis achieved with:  Epinephrine   Imaging obtained: x-ray     Imaging outcome: foreign body not noted     Wound exploration: wound explored through full range of  motion and entire depth of wound visualized   Treatment:    Area cleansed with:  Saline and povidone-iodine   Amount of cleaning:  Extensive   Irrigation solution:  Sterile saline   Irrigation volume:  2000 cc   Irrigation method:  Pressure wash Skin repair:    Repair method:  Sutures   Suture size:  4-0   Suture material:  Nylon   Suture technique:  Simple interrupted   Number of sutures:  8 Approximation:    Approximation:  Close Repair type:    Repair type:  Intermediate Post-procedure details:    Dressing:  Non-adherent dressing, antibiotic ointment and sterile dressing   Procedure completion:  Tolerated well, no immediate complications Comments:     3 deep buried sutures, 8 superficial sutures     Medications Ordered in ED Medications  lisinopril (ZESTRIL) tablet 10 mg (has no administration in time range)  citalopram (CELEXA) tablet 20 mg (has no administration in time range)  levothyroxine (SYNTHROID) tablet 75 mcg (has no administration in time range)  pantoprazole (PROTONIX) EC tablet 40 mg (has no administration in time range)  clopidogrel (PLAVIX) tablet 75 mg (has no administration in time range)  sodium chloride flush (NS) 0.9 % injection 3 mL (has no administration in time range)  acetaminophen (TYLENOL) tablet 650 mg (has no administration in time range)    Or  acetaminophen (TYLENOL) suppository 650 mg (has no administration in time range)  polyethylene glycol (MIRALAX / GLYCOLAX) packet 17 g (has no administration in time range)   Ampicillin-Sulbactam (UNASYN) 3 g in sodium chloride 0.9 % 100 mL IVPB (has no administration in time range)  lidocaine-EPINEPHrine (XYLOCAINE W/EPI) 2 %-1:200000 (PF) injection 20 mL (20 mLs Infiltration Given by Other 02/13/23 1617)  fentaNYL (SUBLIMAZE) injection 50 mcg (50 mcg Intravenous Given 02/13/23 1617)  Ampicillin-Sulbactam (UNASYN) 3 g in sodium chloride 0.9 % 100 mL IVPB (0 g Intravenous Stopped 02/13/23 1723)    ED Course/ Medical Decision Making/ A&P Clinical Course as of 02/13/23 1820  Sun Feb 13, 2023  1523 Spoke with infectious disease Dr. Daiva Eves regarding antibiotic use, he recommends unasyn  [AS]    Clinical Course User Index [AS] Lula Olszewski Edsel Petrin, PA-C                             Medical Decision Making Amount and/or Complexity of Data Reviewed Labs: ordered. Radiology: ordered.  Risk Prescription drug management.  This patient presents to the ED for concern of right lower extremity wound, this involves an extensive number of treatment options, and is a complaint that carries with it a high risk of complications and morbidity.  The differential diagnosis includes open fracture, laceration, abrasion, skin tear  Co morbidities that complicate the patient evaluation  CKD, CHF, hypertension, previous stroke on Plavix, anxiety, depression, anemia   My initial workup includes basic labs, imaging, wound repair  Additional history obtained from: Nursing notes from this visit. EMS provides a portion of the history  I ordered, reviewed and interpreted labs which include: BMP, CBC  I ordered imaging studies including x-ray right tib/fib I independently visualized and interpreted imaging which showed negative I agree with the radiologist interpretation  Consultations Obtained:  I requested consultation with infectious disease Dr. Daiva Eves.  He recommends IV Unasyn  I requested consultation with the hospitalist,  and discussed lab and imaging findings as well as  pertinent plan - they recommend: admission  Afebrile, hemodynamically stable.  89 year old female presenting to the ED for evaluation of a wound to the right lower extremity.  This occurred approximate 1 hour prior to arrival.  This occurred via impalement from a goat horn at her home.  She lives alone.  She does not like to drive.  Her neurovascular status is intact.  Imaging is negative for foreign bodies or fractures.  Infectious diseases consulted regarding antibiotic use.  They recommend Unasyn with transition to Augmentin.  Given patient's age, comorbidities, the fact that she lives alone, lives outside of the city, and does not like to drive the patient will benefit from admission to hospitalist for continued monitoring as she has a high risk wound.  Patient is in agreement with this plan.  Patient's case discussed with Dr. Durwin Nora who agrees with plan.   Note: Portions of this report may have been transcribed using voice recognition software. Every effort was made to ensure accuracy; however, inadvertent computerized transcription errors may still be present.        Final Clinical Impression(s) / ED Diagnoses Final diagnoses:  Struck by other hoof stock, initial encounter  Laceration of right lower extremity, initial encounter    Rx / DC Orders ED Discharge Orders     None         Mora Bellman 02/13/23 1820    Gloris Manchester, MD 02/13/23 2138

## 2023-02-13 NOTE — ED Notes (Signed)
ED TO INPATIENT HANDOFF REPORT  ED Nurse Name and Phone #:  Gabriel Rung, RN [phone]  S Name/Age/Gender Leslie [name] 89 y.o. female Room/Bed: 002C/002C  Code Status   Code Status: Full Code  Home/SNF/Other Home Patient oriented to: self, place, time, and situation Is this baseline? Yes   Triage Complete: Triage complete  Chief Complaint Leg wound, right [S81.801A]  Triage Note BIB [facility] from home s/p R leg lateral calf laceration caused by goat horn, when she was feeding her goats, laceration described as deep, noted to be 9cm, bleeding controlled with gauze and ABD pad wrap. VSS. BP 112/50, HR 88. Takes plavix. Td unknown. Alert, NAD, calm, interactive. Mentioned to EMS on arrival to ED "starting to feel light headed and weak".    Allergies Allergies  Allergen Reactions   Sulfa Antibiotics Other (See Comments)    "urine crystallizes"   Cephalexin Diarrhea   Dilaudid [Hydromorphone Hcl]         Xopenex [Levalbuterol Hcl] Other (See Comments)    "gives me the shakes"    Level of Care/Admitting Diagnosis ED Disposition     ED Disposition  Admit   Condition  --   Comment  Hospital Area: [facility] [[id number]]  Level of Care: Med-Surg [16]  May place patient in observation at [facility] or [facility] if equivalent level of care is available:: No  Covid Evaluation: Asymptomatic - no recent exposure (last 10 days) testing not required  Diagnosis: Leg wound, right [440102]  Admitting Physician: Synetta Fail [[id number]]  Attending Physician: Synetta Fail [phone]          B Medical/Surgery History Past Medical History:  Diagnosis Date   Anemia    Anxiety    Arthritis    Bundle branch block left    CHF (congestive heart failure) (HCC)    chronic combine CHF   Complication of anesthesia    " stomach did not wake up after back surgery "    Corneal burn    left eye-is almost healed"Cascade pod"   Depression    GERD  (gastroesophageal reflux disease)    tums   Headache    hx of migraines    Heart failure, left-sided (HCC)    Hypertension    Hypothyroidism    MI (myocardial infarction) (HCC)    Nonischemic cardiomyopathy (HCC)    '01 EF 30% with normal coronaries; EF 45-50% 03/2012   Shortness of breath    hx of nothing current on 01/17/2015    Stroke [facility])    blurred vision residual   Past Surgical History:  Procedure Laterality Date   CARDIAC CATHETERIZATION     13 yrs. ago   CATARACT EXTRACTION, BILATERAL Bilateral    detached retinia     DILATION AND CURETTAGE OF UTERUS     EYE SURGERY     bilateral cataracts   HERNIA REPAIR     hiatial   hiatal hernia surgery      LUMBAR LAMINECTOMY/DECOMPRESSION MICRODISCECTOMY  05/17/2012   Procedure: LUMBAR LAMINECTOMY/DECOMPRESSION MICRODISCECTOMY 2 LEVELS;  Surgeon: Cristi Loron, MD;  Location: [facility];  Service: Neurosurgery;  Laterality: N/A;  Lumbar four laminectomy with bilateral Lumbar three laminotomies and removal of synovial cyst   TONSILLECTOMY     TOTAL KNEE ARTHROPLASTY Left 01/21/2015   Procedure: LEFT TOTAL KNEE ARTHROPLASTY;  Surgeon: Durene Romans, MD;  Location: [facility] ORS;  Service: Orthopedics;  Laterality: Left;   TOTAL KNEE ARTHROPLASTY Right 07/22/2015  Procedure: RIGHT TOTAL KNEE ARTHROPLASTY;  Surgeon: Durene Romans, MD;  Location: [facility] ORS;  Service: Orthopedics;  Laterality: Right;     A IV Location/Drains/Wounds Patient Lines/Drains/Airways Status     Active Line/Drains/Airways     Name Placement date Placement time Site Days   Peripheral IV 02/13/23 20 G Left;Posterior Wrist 02/13/23  1512  Wrist  less than 1            Intake/Output Last 24 hours  Intake/Output Summary (Last 24 hours) at 02/13/2023 1922 Last data filed at 02/13/2023 1723 Gross per 24 hour  Intake 100 ml  Output --  Net 100 ml    Labs/Imaging Results for orders placed or performed during the hospital encounter of 02/13/23 (from the past  48 hour(s))  CBC with Differential     Status: Abnormal   Collection Time: 02/13/23  2:47 PM  Result Value Ref Range   WBC 7.4 4.0 - 10.5 K/uL   RBC 4.98 3.87 - 5.11 MIL/uL   Hemoglobin 15.0 12.0 - 15.0 g/dL   HCT 83.1 (H) 51.7 - 61.6 %   MCV 94.0 80.0 - 100.0 fL   MCH 30.1 26.0 - 34.0 pg   MCHC 32.1 30.0 - 36.0 g/dL   RDW 07.3 71.0 - 62.6 %   Platelets 230 150 - 400 K/uL   nRBC 0.0 0.0 - 0.2 %   Neutrophils Relative % 67 %   Neutro Abs 4.9 1.7 - 7.7 K/uL   Lymphocytes Relative 22 %   Lymphs Abs 1.7 0.7 - 4.0 K/uL   Monocytes Relative 7 %   Monocytes Absolute 0.5 0.1 - 1.0 K/uL   Eosinophils Relative 3 %   Eosinophils Absolute 0.2 0.0 - 0.5 K/uL   Basophils Relative 1 %   Basophils Absolute 0.1 0.0 - 0.1 K/uL   Immature Granulocytes 0 %   Abs Immature Granulocytes 0.03 0.00 - 0.07 K/uL    Comment: Performed at [facility], [address] [place], [place] [address]  Basic metabolic panel     Status: Abnormal   Collection Time: 02/13/23  2:47 PM  Result Value Ref Range   Sodium 137 135 - 145 mmol/L   Potassium 4.5 3.5 - 5.1 mmol/L   Chloride 103 98 - 111 mmol/L   CO2 24 22 - 32 mmol/L   Glucose, Bld 124 (H) 70 - 99 mg/dL    Comment: Glucose reference range applies only to samples taken after fasting for at least 8 hours.   BUN 16 8 - 23 mg/dL   Creatinine, Ser 6.27 (H) 0.44 - 1.00 mg/dL   Calcium 9.6 8.9 - 03.5 mg/dL   GFR, Estimated 41 (L) >60 mL/min    Comment: (NOTE) Calculated using the CKD-EPI Creatinine Equation (2021)    Anion gap 10 5 - 15    Comment: Performed at [facility], [address], [place], [place] [address]   DG Tibia/Fibula Right  Result Date: 02/13/2023 CLINICAL DATA:  Goat horn injury. EXAM: RIGHT TIBIA AND FIBULA - 2 VIEW COMPARISON:  None Available. FINDINGS: Right knee arthroplasty. No fracture of the tibia or fibula, there is no periprosthetic fracture. Soft tissue edema and soft tissue gas about the upper lateral aspect of the  lower leg. No radiopaque foreign body. There is scattered soft tissue phleboliths. IMPRESSION: Soft tissue edema and soft tissue gas about the upper lateral aspect of the lower leg. No radiopaque foreign body or acute osseous abnormality. Electronically Signed  By: Narda Rutherford M.D.   On: 02/13/2023 15:32    Pending Labs Unresulted Labs (From admission, onward)     Start     Ordered   02/14/23 0500  CBC  Tomorrow morning,   R        02/13/23 1803   02/14/23 0500  Basic metabolic panel  Tomorrow morning,   R        02/13/23 1803            Vitals/Pain Today's Vitals   02/13/23 1450 02/13/23 1451 02/13/23 1545 02/13/23 1615  BP: (!) 116/56  (!) 94/51 (!) 142/60  Pulse: 81  72 65  Resp: 20     Temp: 98.5 F (36.9 C)     TempSrc: Oral     SpO2: 100%  100% 97%  Weight:  68.9 kg    PainSc:  5       Isolation Precautions No active isolations  Medications Medications  lisinopril (ZESTRIL) tablet 10 mg (has no administration in time range)  citalopram (CELEXA) tablet 20 mg (has no administration in time range)  levothyroxine (SYNTHROID) tablet 75 mcg (has no administration in time range)  pantoprazole (PROTONIX) EC tablet 40 mg (has no administration in time range)  clopidogrel (PLAVIX) tablet 75 mg (has no administration in time range)  sodium chloride flush (NS) 0.9 % injection 3 mL (has no administration in time range)  acetaminophen (TYLENOL) tablet 650 mg (has no administration in time range)    Or  acetaminophen (TYLENOL) suppository 650 mg (has no administration in time range)  polyethylene glycol (MIRALAX / GLYCOLAX) packet 17 g (has no administration in time range)  Ampicillin-Sulbactam (UNASYN) 3 g in sodium chloride 0.9 % 100 mL IVPB (has no administration in time range)  LORazepam (ATIVAN) tablet 0.5-1 mg (has no administration in time range)  sodium chloride 0.9 % bolus 500 mL (has no administration in time range)  polyvinyl alcohol (LIQUIFILM TEARS) 1.4 %  ophthalmic solution 1 drop (has no administration in time range)  lidocaine-EPINEPHrine (XYLOCAINE W/EPI) 2 %-1:200000 (PF) injection 20 mL (20 mLs Infiltration Given by Other 02/13/23 1617)  fentaNYL (SUBLIMAZE) injection 50 mcg (50 mcg Intravenous Given 02/13/23 1617)  Ampicillin-Sulbactam (UNASYN) 3 g in sodium chloride 0.9 % 100 mL IVPB (0 g Intravenous Stopped 02/13/23 1723)    Mobility walks with person assist        R Recommendations: See Admitting Provider Note  Report given to:   Additional Notes:  Patient's wound has been cleansed and dressed by day shift staff ; Pt's pain is low currently; she normally walks on her own but state's she is afraid to right now so she may need assistance with walking; Pt from home alone but family currently at bedside. Patient is A&Ox 4-Monique,RN

## 2023-02-13 NOTE — ED Notes (Signed)
Tdap received in 2021 per EPIC

## 2023-02-13 NOTE — ED Triage Notes (Signed)
BIB [facility] from home s/p R leg lateral calf laceration caused by goat horn, when she was feeding her goats, laceration described as deep, noted to be 9cm, bleeding controlled with gauze and ABD pad wrap. VSS. BP 112/50, HR 88. Takes plavix. Td unknown. Alert, NAD, calm, interactive. Mentioned to EMS on arrival to ED "starting to feel light headed and weak".

## 2023-02-13 NOTE — ED Notes (Signed)
Transport has been called for transfer; ETA 5 minutes out-Monique,RN
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
## 2023-02-13 NOTE — ED Notes (Addendum)
EDPA at [facility] upon arrival, R leg lac bleeding controlled, alert, NAD, calm, interactive, resps e/u, skin W&D.

## 2023-02-13 NOTE — ED Notes (Signed)
Irrigation of wound in progress, tolerating well, family x2 at [facility].

## 2023-02-14 DIAGNOSIS — S81801A Unspecified open wound, right lower leg, initial encounter: Secondary | ICD-10-CM

## 2023-02-14 LAB — CBC
HCT: 37.5 % (ref 36.0–46.0)
Hemoglobin: 11.6 g/dL — ABNORMAL LOW (ref 12.0–15.0)
MCH: 29.6 pg (ref 26.0–34.0)
MCHC: 30.9 g/dL (ref 30.0–36.0)
MCV: 95.7 fL (ref 80.0–100.0)
Platelets: 186 10*3/uL (ref 150–400)
RBC: 3.92 MIL/uL (ref 3.87–5.11)
RDW: 13.8 % (ref 11.5–15.5)
WBC: 7.3 10*3/uL (ref 4.0–10.5)
nRBC: 0 % (ref 0.0–0.2)

## 2023-02-14 LAB — BASIC METABOLIC PANEL
Anion gap: 8 (ref 5–15)
BUN: 16 mg/dL (ref 8–23)
CO2: 25 mmol/L (ref 22–32)
Calcium: 8.7 mg/dL — ABNORMAL LOW (ref 8.9–10.3)
Chloride: 104 mmol/L (ref 98–111)
Creatinine, Ser: 1.24 mg/dL — ABNORMAL HIGH (ref 0.44–1.00)
GFR, Estimated: 41 mL/min — ABNORMAL LOW (ref >60)
Glucose, Bld: 165 mg/dL — ABNORMAL HIGH (ref 70–99)
Potassium: 3.9 mmol/L (ref 3.5–5.1)
Sodium: 137 mmol/L (ref 135–145)

## 2023-02-14 MED ORDER — AMOXICILLIN-POT CLAVULANATE 500-125 MG PO TABS: 1.0000 | ORAL_TABLET | Freq: Two times a day (BID) | ORAL | 0 refills | Status: AC

## 2023-02-14 MED ORDER — ORAL CARE MOUTH RINSE: 15.0000 mL | OROMUCOSAL | Status: DC | PRN

## 2023-02-14 NOTE — Care Management Obs Status (Signed)
MEDICARE OBSERVATION STATUS NOTIFICATION   Patient Details  Name: Leslie [name] MRN: [id number] Date of Birth: [birth year]   Medicare Observation Status Notification Given:  Yes    Tom-Johnson, Hershal Coria, RN 02/14/2023, 10:34 AM

## 2023-02-14 NOTE — Plan of Care (Signed)

## 2023-02-14 NOTE — TOC Transition Note (Signed)
Transition of Care [facility]) - CM/SW Discharge Note   Patient Details  Name: Leslie [name] MRN: [id number] Date of Birth: [birth year]  Transition of Care [facility]) CM/SW Contact:  Tom-Johnson, Hershal Coria, RN Phone Number: 02/14/2023, 12:03 PM   Clinical Narrative:     Patient is scheduled for discharge today.  Home health RN for Rt leg wound referral called in to [facility] per patient's request from Medicare.gov list and Elnita Maxwell voiced acceptance, info on AVS.   Hospital f/u and discharge instructions on AVS. Grand daughter, Tobi Bastos to transport at discharge.  No further TOC needs noted.      Final next level of care: Home w Home Health Services Barriers to Discharge: Barriers Resolved   Patient Goals and CMS Choice CMS Medicare.gov Compare Post Acute Care list provided to:: Patient Choice offered to / list presented to : Patient  Discharge Placement                  Patient to be transferred to facility by: Grand daughter Name of family member notified: Normand Sloop    Discharge Plan and Services Additional resources added to the After Visit Summary for                  DME Arranged: N/A DME Agency: NA       HH Arranged: RN HH Agency: [facility] Date [facility] Agency Contacted: 02/14/23 Time HH Agency Contacted: [phone] Representative spoke with at [facility] Agency: Becky Sax  Social Determinants of Health (SDOH) Interventions SDOH Screenings   Food Insecurity: No Food Insecurity (02/14/2023)  Housing: Low Risk  (02/14/2023)  Transportation Needs: No Transportation Needs (02/14/2023)  Utilities: Not At Risk (02/14/2023)  Depression (PHQ2-9): Low Risk  (09/16/2022)  Financial Resource Strain: Low Risk  (01/26/2022)  Tobacco Use: Low Risk  (02/13/2023)     Readmission Risk Interventions     No data to display

## 2023-02-14 NOTE — Progress Notes (Signed)
DISCHARGE NOTE HOME Leslie [name] to be discharged Home per MD order. Discussed prescriptions and follow up appointments with the patient. Prescriptions given to patient; medication list explained in detail. Patient verbalized understanding.  Skin clean, dry and intact without evidence of skin break down, no evidence of skin tears noted. IV catheter discontinued intact. Site without signs and symptoms of complications. Dressing and pressure applied. Pt denies pain at the site currently. No complaints noted.  Patient free of lines, drains, and wounds.   An After Visit Summary (AVS) was printed and given to the patient. Patient escorted via wheelchair, and discharged home via private auto with Leslie [name].  Leslie Cogan, LPN

## 2023-02-14 NOTE — Discharge Summary (Signed)
Physician Discharge Summary  Leslie [name] ZOX:[id number] DOB: 05-Jan-1931 DOA: 02/13/2023  PCP: Karie Schwalbe, MD  Admit date: 02/13/2023  Discharge date: 02/14/2023  Admitted From:Home  Disposition:  Home  Recommendations for Outpatient Follow-up:  Follow up with PCP in 1-2 weeks Home health nursing arranged for wound care and dressing changes daily at home Remove sutures in 7-10 days Continue on Augmentin as prescribed for 9 more days for total 10-day course of treatment Recheck BMP in 1 week to ensure creatinine back to baseline. Continue other home medications as prior  Home Health: Yes with RN for wound care  Equipment/Devices: None  Discharge Condition:Stable  CODE STATUS: Full  Diet recommendation: Heart Healthy  Brief/Interim Summary: Leslie [name] is a 89 y.o. female with medical history significant of CKD 3A, hypothyroidism, hypertension, GERD, stroke, carotid artery disease, CAD, CHF, mood disorder presenting after being gored by one of her goats.  She was admitted for evaluation of her right leg wound and was also noted to have mild AKI on CKD stage III A.  She was started on Unasyn per ID recommendations and will be discharged on Augmentin per ID recommendations today.  She will receive wound care at home with home health nursing care arranged.  No other acute events or concerns noted throughout the course of this brief admission.  Discharge Diagnoses:  Principal Problem:   Leg wound, right Active Problems:   Hypothyroidism   HTN (hypertension)   HFrEF (heart failure with reduced ejection fraction) (HCC)   GERD (gastroesophageal reflux disease)   Mood disorder (HCC)   History of CVA (cerebrovascular accident)   Carotid artery disease (HCC)   Atherosclerotic heart disease of native coronary artery with angina pectoris (HCC)   Struck by other hoof stock, initial encounter   Acute renal failure superimposed on stage 3a chronic kidney disease (HCC)  Principal  discharge diagnosis: Right leg wound secondary to injury by goat.  AKI on CKD stage IIIa.  Discharge Instructions  Discharge Instructions     Diet - low sodium heart healthy   Complete by: As directed    Discharge wound care:   Complete by: As directed    Wound care  Daily      Comments: Change dressing to right leg Q day as follows:  Apply Xeroform gauze over wound/sutures and cover with ABD pad and kerlex   Increase activity slowly   Complete by: As directed       Allergies as of 02/14/2023       Reactions   Sulfa Antibiotics Other (See Comments)   "urine crystallizes"   Cephalexin Diarrhea   Dilaudid [hydromorphone Hcl]       Xopenex [levalbuterol Hcl] Other (See Comments)   "gives me the shakes"        Medication List     TAKE these medications    amoxicillin-clavulanate 500-125 MG tablet Commonly known as: Augmentin Take 1 tablet by mouth in the morning and at bedtime for 9 days.   BD Integra Syringe 25G X 1" 3 ML Misc Generic drug: SYRINGE-NEEDLE (DISP) 3 ML USE 1 EVERY 30 (THIRTY) DAYS. WITH VITAMIN B12 INJECTION   CALCIUM 1200 PO Take 1 tablet by mouth daily.   cholecalciferol 25 MCG (1000 UNIT) tablet Commonly known as: VITAMIN D3 Take 1,000 Units by mouth daily.   citalopram 20 MG tablet Commonly known as: CELEXA TAKE 1 TABLET BY MOUTH EVERY DAY   clopidogrel 75 MG tablet Commonly known as: PLAVIX TAKE 1  TABLET BY MOUTH EVERY DAY   cyanocobalamin 1000 MCG/ML injection Commonly known as: VITAMIN B12 INJECT 1 ML (1,000 MCG) INTRAMUSCULARLY EVERY 30 DAYS   dapagliflozin propanediol 10 MG Tabs tablet Commonly known as: Farxiga Take 1 tablet (10 mg total) by mouth daily before breakfast.   levothyroxine 75 MCG tablet Commonly known as: SYNTHROID TAKE 1 TABLET BY MOUTH EVERY DAY   lisinopril 10 MG tablet Commonly known as: ZESTRIL Take 1 tablet (10 mg total) by mouth daily.   LORazepam 1 MG tablet Commonly known as: ATIVAN TAKE 1/2 TO 1  TABLET BY MOUTH AT BEDTIME AS NEEDED   nitroGLYCERIN 0.4 MG SL tablet Commonly known as: NITROSTAT Place 1 tablet (0.4 mg total) under the tongue every 5 (five) minutes as needed for chest pain.   pantoprazole 40 MG tablet Commonly known as: PROTONIX Take 1 tablet (40 mg total) by mouth daily.   REFRESH OP Place 1 drop into both eyes in the morning, at noon, in the evening, and at bedtime.               Discharge Care Instructions  (From admission, onward)           Start     Ordered   02/14/23 0000  Discharge wound care:       Comments: Wound care  Daily      Comments: Change dressing to right leg Q day as follows:  Apply Xeroform gauze over wound/sutures and cover with ABD pad and kerlex   02/14/23 0948            Follow-up Information     Karie Schwalbe, MD. Schedule an appointment as soon as possible for a visit in 1 week(s).   Specialties: Internal Medicine, Pediatrics Contact information: [address] [place] [address] [phone]                Allergies  Allergen Reactions   Sulfa Antibiotics Other (See Comments)    "urine crystallizes"   Cephalexin Diarrhea   Dilaudid [Hydromorphone Hcl]         Xopenex [Levalbuterol Hcl] Other (See Comments)    "gives me the shakes"    Consultations: None   Procedures/Studies: DG Tibia/Fibula Right  Result Date: 02/13/2023 CLINICAL DATA:  Goat horn injury. EXAM: RIGHT TIBIA AND FIBULA - 2 VIEW COMPARISON:  None Available. FINDINGS: Right knee arthroplasty. No fracture of the tibia or fibula, there is no periprosthetic fracture. Soft tissue edema and soft tissue gas about the upper lateral aspect of the lower leg. No radiopaque foreign body. There is scattered soft tissue phleboliths. IMPRESSION: Soft tissue edema and soft tissue gas about the upper lateral aspect of the lower leg. No radiopaque foreign body or acute osseous abnormality. Electronically Signed   By: Narda Rutherford  M.D.   On: 02/13/2023 15:32     Discharge Exam: Vitals:   02/14/23 0916 02/14/23 0918  BP: (!) 86/41 (!) 101/39  Pulse: 70 71  Resp: 18   Temp: 98.6 F (37 C)   SpO2: 97% 95%   Vitals:   02/14/23 0157 02/14/23 0600 02/14/23 0916 02/14/23 0918  BP: (!) 141/55 (!) 106/53 (!) 86/41 (!) 101/39  Pulse: 73 68 70 71  Resp: 19 15 18    Temp: 98 F (36.7 C) 98.3 F (36.8 C) 98.6 F (37 C)   TempSrc: Oral Oral    SpO2: 99% 93% 97% 95%  Weight:        General:  Pt is alert, awake, not in acute distress Cardiovascular: RRR, S1/S2 +, no rubs, no gallops Respiratory: CTA bilaterally, no wheezing, no rhonchi Abdominal: Soft, NT, ND, bowel sounds + Extremities: no edema, no cyanosis, right lower extremity wounds C/D/I    The results of significant diagnostics from this hospitalization (including imaging, microbiology, ancillary and laboratory) are listed below for reference.     Microbiology: No results found for this or any previous visit (from the past 240 hour(s)).   Labs: BNP (last 3 results) No results for input(s): "BNP" in the last 8760 hours. Basic Metabolic Panel: Recent Labs  Lab 02/13/23 1447  NA 137  K 4.5  CL 103  CO2 24  GLUCOSE 124*  BUN 16  CREATININE 1.24*  CALCIUM 9.6   Liver Function Tests: No results for input(s): "AST", "ALT", "ALKPHOS", "BILITOT", "PROT", "ALBUMIN" in the last 168 hours. No results for input(s): "LIPASE", "AMYLASE" in the last 168 hours. No results for input(s): "AMMONIA" in the last 168 hours. CBC: Recent Labs  Lab 02/13/23 1447  WBC 7.4  NEUTROABS 4.9  HGB 15.0  HCT 46.8*  MCV 94.0  PLT 230   Cardiac Enzymes: No results for input(s): "CKTOTAL", "CKMB", "CKMBINDEX", "TROPONINI" in the last 168 hours. BNP: Invalid input(s): "POCBNP" CBG: No results for input(s): "GLUCAP" in the last 168 hours. D-Dimer No results for input(s): "DDIMER" in the last 72 hours. Hgb A1c No results for input(s): "HGBA1C" in the last 72  hours. Lipid Profile No results for input(s): "CHOL", "HDL", "LDLCALC", "TRIG", "CHOLHDL", "LDLDIRECT" in the last 72 hours. Thyroid function studies No results for input(s): "TSH", "T4TOTAL", "T3FREE", "THYROIDAB" in the last 72 hours.  Invalid input(s): "FREET3" Anemia work up No results for input(s): "VITAMINB12", "FOLATE", "FERRITIN", "TIBC", "IRON", "RETICCTPCT" in the last 72 hours. Urinalysis    Component Value Date/Time   COLORURINE YELLOW 07/16/2015 1150   APPEARANCEUR CLEAR 07/16/2015 1150   LABSPEC 1.008 07/16/2015 1150   PHURINE 6.5 07/16/2015 1150   GLUCOSEU NEGATIVE 07/16/2015 1150   HGBUR TRACE (A) 07/16/2015 1150   BILIRUBINUR NEGATIVE 07/16/2015 1150   KETONESUR NEGATIVE 07/16/2015 1150   PROTEINUR NEGATIVE 07/16/2015 1150   UROBILINOGEN 0.2 01/17/2015 0907   NITRITE NEGATIVE 07/16/2015 1150   LEUKOCYTESUR NEGATIVE 07/16/2015 1150   Sepsis Labs Recent Labs  Lab 02/13/23 1447  WBC 7.4   Microbiology No results found for this or any previous visit (from the past 240 hour(s)).   Time coordinating discharge: 35 minutes  SIGNED:   Erick Blinks, DO [facility] 02/14/2023, 9:50 AM  If 7PM-7AM, please contact night-coverage [email or web address]

## 2023-02-14 NOTE — Consult Note (Addendum)
WOC Nurse Consult Note: Reason for Consult: Consult requested to provide topical treatment orders for discharge for right leg. Performed remotely after review of progress notes and photo in the EMR. Infectious disease has ordered systemic antibiotics.  Wound type: Right anterior calf with full thickness wound; this was noted to be sutured in the ED. Measurement:9 X2cm, according to the bedside nursing wound flow sheet.  Wound bed: red and bloody Periwound: erythremia surrounding Dressing procedure/placement/frequency: Pt could benefit from home health assistance after discharge; please order if desired. Also, Pt will need sutures removed in 7-10 days.  Topical treatment orders provided for bedside nurses to perform as follows: Change dressing to right leg Q day as follows: Apply Xeroform gauze over wound/sutures and cover with ABD pad and kerlex. Please re-consult if further assistance is needed.  Thank-you,  Cammie Mcgee MSN, RN, CWOCN, [place], CNS [phone]

## 2023-02-15 DIAGNOSIS — I255 Ischemic cardiomyopathy: Secondary | ICD-10-CM | POA: Diagnosis not present

## 2023-02-15 DIAGNOSIS — I2510 Atherosclerotic heart disease of native coronary artery without angina pectoris: Secondary | ICD-10-CM | POA: Diagnosis not present

## 2023-02-15 DIAGNOSIS — I130 Hypertensive heart and chronic kidney disease with heart failure and stage 1 through stage 4 chronic kidney disease, or unspecified chronic kidney disease: Secondary | ICD-10-CM | POA: Diagnosis not present

## 2023-02-15 DIAGNOSIS — Z9841 Cataract extraction status, right eye: Secondary | ICD-10-CM | POA: Diagnosis not present

## 2023-02-15 DIAGNOSIS — Z4800 Encounter for change or removal of nonsurgical wound dressing: Secondary | ICD-10-CM | POA: Diagnosis not present

## 2023-02-15 DIAGNOSIS — I447 Left bundle-branch block, unspecified: Secondary | ICD-10-CM | POA: Diagnosis not present

## 2023-02-15 DIAGNOSIS — Z9842 Cataract extraction status, left eye: Secondary | ICD-10-CM | POA: Diagnosis not present

## 2023-02-15 DIAGNOSIS — N179 Acute kidney failure, unspecified: Secondary | ICD-10-CM | POA: Diagnosis not present

## 2023-02-15 DIAGNOSIS — F419 Anxiety disorder, unspecified: Secondary | ICD-10-CM | POA: Diagnosis not present

## 2023-02-15 DIAGNOSIS — F39 Unspecified mood [affective] disorder: Secondary | ICD-10-CM | POA: Diagnosis not present

## 2023-02-15 DIAGNOSIS — Z7902 Long term (current) use of antithrombotics/antiplatelets: Secondary | ICD-10-CM | POA: Diagnosis not present

## 2023-02-15 DIAGNOSIS — I6529 Occlusion and stenosis of unspecified carotid artery: Secondary | ICD-10-CM | POA: Diagnosis not present

## 2023-02-15 DIAGNOSIS — L989 Disorder of the skin and subcutaneous tissue, unspecified: Secondary | ICD-10-CM | POA: Diagnosis not present

## 2023-02-15 DIAGNOSIS — I5021 Acute systolic (congestive) heart failure: Secondary | ICD-10-CM | POA: Diagnosis not present

## 2023-02-15 DIAGNOSIS — D631 Anemia in chronic kidney disease: Secondary | ICD-10-CM | POA: Diagnosis not present

## 2023-02-15 DIAGNOSIS — Z7984 Long term (current) use of oral hypoglycemic drugs: Secondary | ICD-10-CM | POA: Diagnosis not present

## 2023-02-15 DIAGNOSIS — I252 Old myocardial infarction: Secondary | ICD-10-CM | POA: Diagnosis not present

## 2023-02-15 DIAGNOSIS — Z96653 Presence of artificial knee joint, bilateral: Secondary | ICD-10-CM | POA: Diagnosis not present

## 2023-02-15 DIAGNOSIS — N1831 Chronic kidney disease, stage 3a: Secondary | ICD-10-CM | POA: Diagnosis not present

## 2023-02-15 DIAGNOSIS — E039 Hypothyroidism, unspecified: Secondary | ICD-10-CM | POA: Diagnosis not present

## 2023-02-15 DIAGNOSIS — Z8673 Personal history of transient ischemic attack (TIA), and cerebral infarction without residual deficits: Secondary | ICD-10-CM | POA: Diagnosis not present

## 2023-02-15 DIAGNOSIS — K219 Gastro-esophageal reflux disease without esophagitis: Secondary | ICD-10-CM | POA: Diagnosis not present

## 2023-02-15 DIAGNOSIS — G43909 Migraine, unspecified, not intractable, without status migrainosus: Secondary | ICD-10-CM | POA: Diagnosis not present

## 2023-02-16 DIAGNOSIS — Z4800 Encounter for change or removal of nonsurgical wound dressing: Secondary | ICD-10-CM | POA: Diagnosis not present

## 2023-02-16 DIAGNOSIS — I130 Hypertensive heart and chronic kidney disease with heart failure and stage 1 through stage 4 chronic kidney disease, or unspecified chronic kidney disease: Secondary | ICD-10-CM | POA: Diagnosis not present

## 2023-02-16 DIAGNOSIS — I5021 Acute systolic (congestive) heart failure: Secondary | ICD-10-CM | POA: Diagnosis not present

## 2023-02-16 DIAGNOSIS — N1831 Chronic kidney disease, stage 3a: Secondary | ICD-10-CM | POA: Diagnosis not present

## 2023-02-16 DIAGNOSIS — D631 Anemia in chronic kidney disease: Secondary | ICD-10-CM | POA: Diagnosis not present

## 2023-02-16 DIAGNOSIS — L989 Disorder of the skin and subcutaneous tissue, unspecified: Secondary | ICD-10-CM | POA: Diagnosis not present

## 2023-02-17 ENCOUNTER — Ambulatory Visit (INDEPENDENT_AMBULATORY_CARE_PROVIDER_SITE_OTHER): Payer: Medicare Other | Admitting: Internal Medicine

## 2023-02-17 ENCOUNTER — Encounter: Payer: Self-pay | Admitting: Internal Medicine

## 2023-02-17 VITALS — BP 114/64 | HR 65 | Temp 98.2°F | Ht 66.0 in | Wt 145.0 lb

## 2023-02-17 DIAGNOSIS — K219 Gastro-esophageal reflux disease without esophagitis: Secondary | ICD-10-CM | POA: Diagnosis not present

## 2023-02-17 DIAGNOSIS — S81801D Unspecified open wound, right lower leg, subsequent encounter: Secondary | ICD-10-CM
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
## 2023-02-17 NOTE — Assessment & Plan Note (Signed)
Did not tolerate off the pantoprazole  Go back daily and if symptoms improve--can try to wean to every other day

## 2023-02-17 NOTE — Patient Instructions (Signed)
Go back to the pantoprazole daily at bedtime. If your symptoms go away, you can then try to reduce it to every other day

## 2023-02-17 NOTE — Assessment & Plan Note (Signed)
Seems okay and not infected---on the augmentin Dressing changes by home health nurse Probably need to add low dose levaquin if worsens (250mg  daily x 5 ays)

## 2023-02-21 DIAGNOSIS — N1831 Chronic kidney disease, stage 3a: Secondary | ICD-10-CM | POA: Diagnosis not present

## 2023-02-21 DIAGNOSIS — L989 Disorder of the skin and subcutaneous tissue, unspecified: Secondary | ICD-10-CM | POA: Diagnosis not present

## 2023-02-21 DIAGNOSIS — D631 Anemia in chronic kidney disease: Secondary | ICD-10-CM | POA: Diagnosis not present

## 2023-02-21 DIAGNOSIS — Z4800 Encounter for change or removal of nonsurgical wound dressing: Secondary | ICD-10-CM | POA: Diagnosis not present

## 2023-02-21 DIAGNOSIS — I5021 Acute systolic (congestive) heart failure: Secondary | ICD-10-CM | POA: Diagnosis not present

## 2023-02-21 DIAGNOSIS — I130 Hypertensive heart and chronic kidney disease with heart failure and stage 1 through stage 4 chronic kidney disease, or unspecified chronic kidney disease: Secondary | ICD-10-CM | POA: Diagnosis not present

## 2023-02-22 ENCOUNTER — Ambulatory Visit: Payer: Medicare Other | Attending: Physician Assistant | Admitting: Physician Assistant

## 2023-02-22 ENCOUNTER — Encounter: Payer: Self-pay | Admitting: Physician Assistant

## 2023-02-22 VITALS — BP 102/44 | HR 78 | Ht 67.0 in | Wt 144.8 lb

## 2023-02-22 DIAGNOSIS — R0602 Shortness of breath: Secondary | ICD-10-CM

## 2023-02-22 DIAGNOSIS — R5383 Other fatigue: Secondary | ICD-10-CM

## 2023-02-22 DIAGNOSIS — I351 Nonrheumatic aortic (valve) insufficiency: Secondary | ICD-10-CM | POA: Diagnosis not present

## 2023-02-22 DIAGNOSIS — I10 Essential (primary) hypertension: Secondary | ICD-10-CM

## 2023-02-22 DIAGNOSIS — I5022 Chronic systolic (congestive) heart failure: Secondary | ICD-10-CM | POA: Diagnosis not present

## 2023-02-22 DIAGNOSIS — I6523 Occlusion and stenosis of bilateral carotid arteries: Secondary | ICD-10-CM | POA: Diagnosis not present

## 2023-02-22 MED ORDER — LISINOPRIL 5 MG PO TABS: 5.0000 mg | ORAL_TABLET | Freq: Every day | ORAL | 3 refills | Status: DC

## 2023-02-22 NOTE — Assessment & Plan Note (Signed)
Recent low blood pressure readings at home, possibly related to dehydration. -Reduce Lisinopril from 10mg  to 5mg  daily. -Advise to check blood pressure before taking Lisinopril and to hold if systolic blood pressure is less than 100. -Encourage fluid intake of approximately 60 ounces (7.5 cups) daily.

## 2023-02-22 NOTE — Assessment & Plan Note (Signed)
Improved ejection fraction from 30-35% to 45-50% on echocardiogram in May 2024. Recent onset of weakness and shortness of breath, possibly related to recent leg injury and blood loss, deconditioning. No new orthopnea or edema. -Check BNP, CBC, and BMET today to assess for heart failure exacerbation, anemia, or renal dysfunction. -Continue Farxiga 10 mg daily, lisinopril -She was previously intolerant to beta-blocker.  With low blood pressure, I do not think she can tolerate spironolactone. -Follow-up 3 to 4 months with Dr. Excell Seltzer

## 2023-02-22 NOTE — Assessment & Plan Note (Signed)
Moderate AI and mild to moderate MR by echocardiogram May 2024.  Plan annual echocardiogram for surveillance.

## 2023-02-22 NOTE — Assessment & Plan Note (Signed)
Stable on recent imaging. -Plan for repeat imaging in 1 year.

## 2023-02-22 NOTE — Patient Instructions (Signed)
Medication Instructions:  Your physician has recommended you make the following change in your medication:  1.Decrease lisinopril to 5 mg daily.  *If you need a refill on your cardiac medications before your next appointment, please call your pharmacy*   Lab Work: TODAY-BMET, BNP, CBC If you have labs (blood work) drawn today and your tests are completely normal, you will receive your results only by: MyChart Message (if you have MyChart) OR A paper copy in the mail If you have any lab test that is abnormal or we need to change your treatment, we will call you to review the results.   Follow-Up: At [facility], you and your health needs are our priority.  As part of our continuing mission to provide you with exceptional heart care, we have created designated Provider Care Teams.  These Care Teams include your primary Cardiologist (physician) and Advanced Practice Providers (APPs -  Physician Assistants and Nurse Practitioners) who all work together to provide you with the care you need, when you need it.  We recommend signing up for the patient portal called "MyChart".  Sign up information is provided on this After Visit Summary.  MyChart is used to connect with patients for Virtual Visits (Telemedicine).  Patients are able to view lab/test results, encounter notes, upcoming appointments, etc.  Non-urgent messages can be sent to your provider as well.   To learn more about what you can do with MyChart, go to [email or web address].    Your next appointment:   3-4 month(s)  Provider:   Tonny Bollman, MD
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
## 2023-02-23 ENCOUNTER — Telehealth: Payer: Self-pay | Admitting: *Deleted

## 2023-02-23 ENCOUNTER — Telehealth: Payer: Self-pay | Admitting: Physician Assistant

## 2023-02-23 DIAGNOSIS — Z79899 Other long term (current) drug therapy: Secondary | ICD-10-CM

## 2023-02-23 LAB — CBC
Hematocrit: 36.5 % (ref 34.0–46.6)
Hemoglobin: 11.8 g/dL (ref 11.1–15.9)
MCH: 29.7 pg (ref 26.6–33.0)
MCHC: 32.3 g/dL (ref 31.5–35.7)
MCV: 92 fL (ref 79–97)
Platelets: 280 10*3/uL (ref 150–450)
RBC: 3.97 x10E6/uL (ref 3.77–5.28)
RDW: 12.8 % (ref 11.7–15.4)
WBC: 6.5 10*3/uL (ref 3.4–10.8)

## 2023-02-23 LAB — BASIC METABOLIC PANEL
BUN/Creatinine Ratio: 12 (ref 12–28)
BUN: 16 mg/dL (ref 10–36)
CO2: 23 mmol/L (ref 20–29)
Calcium: 9.5 mg/dL (ref 8.7–10.3)
Chloride: 103 mmol/L (ref 96–106)
Creatinine, Ser: 1.38 mg/dL — ABNORMAL HIGH (ref 0.57–1.00)
Glucose: 99 mg/dL (ref 70–99)
Potassium: 4.6 mmol/L (ref 3.5–5.2)
Sodium: 140 mmol/L (ref 134–144)
eGFR: 36 mL/min/{1.73_m2} — ABNORMAL LOW (ref >59)

## 2023-02-23 LAB — PRO B NATRIURETIC PEPTIDE: NT-Pro BNP: 1010 pg/mL — ABNORMAL HIGH (ref 0–738)

## 2023-02-23 MED ORDER — FUROSEMIDE 20 MG PO TABS
ORAL_TABLET | ORAL | 3 refills | Status: DC
Start: 2023-02-23 — End: 2023-02-28

## 2023-02-23 NOTE — Telephone Encounter (Signed)
-----   Message from [employer] sent at 02/23/2023  9:14 AM EDT ----- Hgb normal. Creatinine somewhat increased since 7/8. K+ normal. BNP is high indicating fluid excess contributing to her shortness of breath. She needs diuresis but we need to be cautious in the setting of increased Creatinine. She will need close follow up.  PLAN:  -Start Lasix 20 mg once daily x 3 days, then change to 20 mg every Mon, Wed, Fri -BMET, BNP Monday 7/22 -BMET Monday 7/29 -Arrange follow up in the next 3-4 weeks w Dr. Excell Seltzer, me or another APP Tereso Newcomer, PA-C    02/23/2023 9:06 AM

## 2023-02-23 NOTE — Telephone Encounter (Signed)
Spoke to patient and verified allergy.  Sulfonamide non-antimicrobials include drugs such as furosemide, hydrochlorothiazide, acetazolamide, sulfonylureas, and celecoxib. Sulfonamide antimicrobials and sulfonamide non-antimicrobials have different chemical structures, and there is no clinical evidence of cross-allergy.   Patient has been informed that it is safe for her to take lasix.

## 2023-02-23 NOTE — Telephone Encounter (Signed)
Thank you! Tereso Newcomer, PA-C    02/23/2023 4:46 PM

## 2023-02-23 NOTE — Telephone Encounter (Signed)
Pt c/o medication issue:  1. Name of Medication: furosemide (LASIX) 20 MG tablet   2. How are you currently taking this medication (dosage and times per day)? TAKE 1 TABLET BY MOUTH DAILY X'S 3 DAYS THEN TAKE 1 TABLET BY MOUTH ONLY ON MONDAY, WEDNESDAY, & FRIDAYS   3. Are you having a reaction (difficulty breathing--STAT)? No  4. What is your medication issue? Pt would like to know if another medication is able to be prescribed being that medication has Sulfur in it, which she's allergic to. Please advise

## 2023-02-24 DIAGNOSIS — I5021 Acute systolic (congestive) heart failure: Secondary | ICD-10-CM | POA: Diagnosis not present

## 2023-02-24 DIAGNOSIS — D631 Anemia in chronic kidney disease: Secondary | ICD-10-CM | POA: Diagnosis not present

## 2023-02-24 DIAGNOSIS — Z4800 Encounter for change or removal of nonsurgical wound dressing: Secondary | ICD-10-CM | POA: Diagnosis not present

## 2023-02-24 DIAGNOSIS — N1831 Chronic kidney disease, stage 3a: Secondary | ICD-10-CM | POA: Diagnosis not present

## 2023-02-24 DIAGNOSIS — I130 Hypertensive heart and chronic kidney disease with heart failure and stage 1 through stage 4 chronic kidney disease, or unspecified chronic kidney disease: Secondary | ICD-10-CM | POA: Diagnosis not present

## 2023-02-24 DIAGNOSIS — L989 Disorder of the skin and subcutaneous tissue, unspecified: Secondary | ICD-10-CM | POA: Diagnosis not present

## 2023-02-25 ENCOUNTER — Encounter: Payer: Self-pay | Admitting: Internal Medicine

## 2023-02-25 ENCOUNTER — Ambulatory Visit (INDEPENDENT_AMBULATORY_CARE_PROVIDER_SITE_OTHER): Payer: Medicare Other | Admitting: Internal Medicine

## 2023-02-25 VITALS — BP 112/68 | HR 71 | Temp 97.5°F | Ht 67.0 in | Wt 142.0 lb

## 2023-02-25 DIAGNOSIS — I5022 Chronic systolic (congestive) heart failure: Secondary | ICD-10-CM | POA: Diagnosis not present

## 2023-02-25 DIAGNOSIS — S81801D Unspecified open wound, right lower leg, subsequent encounter: Secondary | ICD-10-CM

## 2023-02-25 LAB — CBC
MCHC: 32.4 g/dL (ref 32.0–36.0)
MPV: 9.5 fL (ref 7.5–12.5)
RBC: 4.16 10*6/uL (ref 3.80–5.10)

## 2023-02-25 NOTE — Assessment & Plan Note (Signed)
Healing well No evidence of infection now Sutures removed-- #7  Steri strips applied--home care discussed
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
## 2023-02-25 NOTE — Assessment & Plan Note (Signed)
Will recheck labs since doesn't feel right

## 2023-02-26 LAB — RENAL FUNCTION PANEL
Albumin: 3.8 g/dL (ref 3.6–5.1)
BUN/Creatinine Ratio: 13 (calc) (ref 6–22)
BUN: 18 mg/dL (ref 7–25)
CO2: 26 mmol/L (ref 20–32)
Calcium: 9 mg/dL (ref 8.6–10.4)
Chloride: 102 mmol/L (ref 98–110)
Creat: 1.34 mg/dL — ABNORMAL HIGH (ref 0.60–0.95)
Glucose, Bld: 93 mg/dL (ref 65–99)
Phosphorus: 3.5 mg/dL (ref 2.1–4.3)
Potassium: 4.7 mmol/L (ref 3.5–5.3)
Sodium: 139 mmol/L (ref 135–146)

## 2023-02-26 LAB — CBC
HCT: 37.7 % (ref 35.0–45.0)
Hemoglobin: 12.2 g/dL (ref 11.7–15.5)
MCH: 29.3 pg (ref 27.0–33.0)
MCV: 90.6 fL (ref 80.0–100.0)
Platelets: 267 10*3/uL (ref 140–400)
RDW: 12.8 % (ref 11.0–15.0)
WBC: 4.7 10*3/uL (ref 3.8–10.8)

## 2023-02-27 LAB — PRO B NATRIURETIC PEPTIDE: NT-Pro BNP: 1120 pg/mL — ABNORMAL HIGH (ref 0–738)

## 2023-02-28 ENCOUNTER — Ambulatory Visit: Payer: Medicare Other

## 2023-02-28 ENCOUNTER — Telehealth: Payer: Self-pay | Admitting: *Deleted

## 2023-02-28 DIAGNOSIS — Z79899 Other long term (current) drug therapy: Secondary | ICD-10-CM

## 2023-02-28 DIAGNOSIS — L989 Disorder of the skin and subcutaneous tissue, unspecified: Secondary | ICD-10-CM | POA: Diagnosis not present

## 2023-02-28 DIAGNOSIS — D631 Anemia in chronic kidney disease: Secondary | ICD-10-CM | POA: Diagnosis not present

## 2023-02-28 DIAGNOSIS — I130 Hypertensive heart and chronic kidney disease with heart failure and stage 1 through stage 4 chronic kidney disease, or unspecified chronic kidney disease: Secondary | ICD-10-CM | POA: Diagnosis not present

## 2023-02-28 DIAGNOSIS — N1831 Chronic kidney disease, stage 3a: Secondary | ICD-10-CM | POA: Diagnosis not present

## 2023-02-28 DIAGNOSIS — Z4800 Encounter for change or removal of nonsurgical wound dressing: Secondary | ICD-10-CM | POA: Diagnosis not present

## 2023-02-28 DIAGNOSIS — I5021 Acute systolic (congestive) heart failure: Secondary | ICD-10-CM | POA: Diagnosis not present

## 2023-02-28 MED ORDER — FUROSEMIDE 20 MG PO TABS: ORAL_TABLET | ORAL | 3 refills | Status: DC

## 2023-02-28 NOTE — Telephone Encounter (Signed)
-----   Message from Tereso Newcomer sent at 02/28/2023  8:44 AM EDT ----- BNP slightly higher. Creatinine stable. PLAN:  -Take Lasix 40 mg x 1 -Then increase Lasix dose to Lasix 20 mg once daily  -BMET, BNP 1 week Tereso Newcomer, PA-C    02/28/2023 8:43 AM

## 2023-03-01 DIAGNOSIS — I130 Hypertensive heart and chronic kidney disease with heart failure and stage 1 through stage 4 chronic kidney disease, or unspecified chronic kidney disease: Secondary | ICD-10-CM | POA: Diagnosis not present

## 2023-03-01 DIAGNOSIS — I5021 Acute systolic (congestive) heart failure: Secondary | ICD-10-CM | POA: Diagnosis not present

## 2023-03-01 DIAGNOSIS — L989 Disorder of the skin and subcutaneous tissue, unspecified: Secondary | ICD-10-CM | POA: Diagnosis not present

## 2023-03-01 DIAGNOSIS — N1831 Chronic kidney disease, stage 3a: Secondary | ICD-10-CM | POA: Diagnosis not present

## 2023-03-01 DIAGNOSIS — Z4800 Encounter for change or removal of nonsurgical wound dressing: Secondary | ICD-10-CM | POA: Diagnosis not present

## 2023-03-01 DIAGNOSIS — D631 Anemia in chronic kidney disease: Secondary | ICD-10-CM | POA: Diagnosis not present

## 2023-03-03 DIAGNOSIS — I130 Hypertensive heart and chronic kidney disease with heart failure and stage 1 through stage 4 chronic kidney disease, or unspecified chronic kidney disease: Secondary | ICD-10-CM | POA: Diagnosis not present

## 2023-03-03 DIAGNOSIS — D631 Anemia in chronic kidney disease: Secondary | ICD-10-CM | POA: Diagnosis not present

## 2023-03-03 DIAGNOSIS — Z4800 Encounter for change or removal of nonsurgical wound dressing: Secondary | ICD-10-CM | POA: Diagnosis not present

## 2023-03-03 DIAGNOSIS — N1831 Chronic kidney disease, stage 3a: Secondary | ICD-10-CM | POA: Diagnosis not present

## 2023-03-03 DIAGNOSIS — L989 Disorder of the skin and subcutaneous tissue, unspecified: Secondary | ICD-10-CM | POA: Diagnosis not present

## 2023-03-03 DIAGNOSIS — I5021 Acute systolic (congestive) heart failure: Secondary | ICD-10-CM | POA: Diagnosis not present

## 2023-03-07 ENCOUNTER — Ambulatory Visit: Payer: Medicare Other

## 2023-03-07 ENCOUNTER — Other Ambulatory Visit (INDEPENDENT_AMBULATORY_CARE_PROVIDER_SITE_OTHER): Payer: Medicare Other

## 2023-03-07 DIAGNOSIS — Z79899 Other long term (current) drug therapy: Secondary | ICD-10-CM

## 2023-03-08 DIAGNOSIS — N1831 Chronic kidney disease, stage 3a: Secondary | ICD-10-CM | POA: Diagnosis not present

## 2023-03-08 DIAGNOSIS — L989 Disorder of the skin and subcutaneous tissue, unspecified: Secondary | ICD-10-CM | POA: Diagnosis not present

## 2023-03-08 DIAGNOSIS — I130 Hypertensive heart and chronic kidney disease with heart failure and stage 1 through stage 4 chronic kidney disease, or unspecified chronic kidney disease: Secondary | ICD-10-CM | POA: Diagnosis not present

## 2023-03-08 DIAGNOSIS — D631 Anemia in chronic kidney disease: Secondary | ICD-10-CM | POA: Diagnosis not present

## 2023-03-08 DIAGNOSIS — I5021 Acute systolic (congestive) heart failure: Secondary | ICD-10-CM | POA: Diagnosis not present

## 2023-03-08 DIAGNOSIS — Z4800 Encounter for change or removal of nonsurgical wound dressing: Secondary | ICD-10-CM | POA: Diagnosis not present

## 2023-03-09 ENCOUNTER — Other Ambulatory Visit: Payer: Self-pay | Admitting: Family

## 2023-03-10 ENCOUNTER — Telehealth: Payer: Self-pay | Admitting: Internal Medicine

## 2023-03-10 DIAGNOSIS — N1831 Chronic kidney disease, stage 3a: Secondary | ICD-10-CM | POA: Diagnosis not present

## 2023-03-10 DIAGNOSIS — Z4800 Encounter for change or removal of nonsurgical wound dressing: Secondary | ICD-10-CM | POA: Diagnosis not present

## 2023-03-10 DIAGNOSIS — I130 Hypertensive heart and chronic kidney disease with heart failure and stage 1 through stage 4 chronic kidney disease, or unspecified chronic kidney disease: Secondary | ICD-10-CM | POA: Diagnosis not present

## 2023-03-10 DIAGNOSIS — D631 Anemia in chronic kidney disease: Secondary | ICD-10-CM | POA: Diagnosis not present

## 2023-03-10 DIAGNOSIS — L989 Disorder of the skin and subcutaneous tissue, unspecified: Secondary | ICD-10-CM | POA: Diagnosis not present

## 2023-03-10 DIAGNOSIS — I5021 Acute systolic (congestive) heart failure: Secondary | ICD-10-CM | POA: Diagnosis not present

## 2023-03-10 NOTE — Telephone Encounter (Signed)
Spoke to pt  Made appt for tomorrow

## 2023-03-10 NOTE — Telephone Encounter (Signed)
Arman Filter from [facility] called stating the pt has been experiencing bad back pain, all over but mainly right side. Arman Filter stated the pt has been taking Tylenol extra strength, but there is no relief. Arman Filter asked if the pt needs an appt or can a rx be sent in? If so, preferred pharmacy is [id number] - [place], [place] - [address]. Call back # [phone]

## 2023-03-11 ENCOUNTER — Encounter: Payer: Self-pay | Admitting: Internal Medicine

## 2023-03-11 ENCOUNTER — Ambulatory Visit: Payer: Medicare Other | Admitting: Internal Medicine

## 2023-03-11 VITALS — BP 114/70 | HR 70 | Temp 97.9°F | Ht 67.0 in | Wt 146.0 lb

## 2023-03-11 DIAGNOSIS — M545 Low back pain, unspecified: Secondary | ICD-10-CM | POA: Diagnosis not present

## 2023-03-11 MED ORDER — PREDNISONE 20 MG PO TABS
40.0000 mg | ORAL_TABLET | Freq: Every day | ORAL | 0 refills | Status: DC
Start: 2023-03-11 — End: 2023-05-26

## 2023-03-11 NOTE — Patient Instructions (Signed)
Please let me know if you are not feeling better by next week---and I will make the referral to Dr Despina Hick. You can try heat--and continue the tylenol

## 2023-03-11 NOTE — Assessment & Plan Note (Signed)
Pain could be radicular but I am concerned about her right hip as well Will try prednisone burst (40 x 3, 20 x 3) If not better next week, will set up with orthopedist (they request [facility]) Continue tylenol
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
## 2023-03-14 DIAGNOSIS — D631 Anemia in chronic kidney disease: Secondary | ICD-10-CM | POA: Diagnosis not present

## 2023-03-14 DIAGNOSIS — I5021 Acute systolic (congestive) heart failure: Secondary | ICD-10-CM | POA: Diagnosis not present

## 2023-03-14 DIAGNOSIS — L989 Disorder of the skin and subcutaneous tissue, unspecified: Secondary | ICD-10-CM | POA: Diagnosis not present

## 2023-03-14 DIAGNOSIS — Z4800 Encounter for change or removal of nonsurgical wound dressing: Secondary | ICD-10-CM | POA: Diagnosis not present

## 2023-03-14 DIAGNOSIS — N1831 Chronic kidney disease, stage 3a: Secondary | ICD-10-CM | POA: Diagnosis not present

## 2023-03-14 DIAGNOSIS — I130 Hypertensive heart and chronic kidney disease with heart failure and stage 1 through stage 4 chronic kidney disease, or unspecified chronic kidney disease: Secondary | ICD-10-CM | POA: Diagnosis not present

## 2023-03-16 ENCOUNTER — Encounter: Payer: Self-pay | Admitting: Physician Assistant

## 2023-03-16 ENCOUNTER — Telehealth: Payer: Self-pay | Admitting: Internal Medicine

## 2023-03-16 ENCOUNTER — Ambulatory Visit: Payer: Medicare Other | Attending: Physician Assistant | Admitting: Physician Assistant

## 2023-03-16 VITALS — BP 138/62 | HR 61 | Ht 67.0 in | Wt 146.0 lb

## 2023-03-16 DIAGNOSIS — Z7902 Long term (current) use of antithrombotics/antiplatelets: Secondary | ICD-10-CM

## 2023-03-16 DIAGNOSIS — I6529 Occlusion and stenosis of unspecified carotid artery: Secondary | ICD-10-CM | POA: Diagnosis not present

## 2023-03-16 DIAGNOSIS — I5022 Chronic systolic (congestive) heart failure: Secondary | ICD-10-CM | POA: Diagnosis not present

## 2023-03-16 DIAGNOSIS — I10 Essential (primary) hypertension: Secondary | ICD-10-CM

## 2023-03-16 DIAGNOSIS — F39 Unspecified mood [affective] disorder: Secondary | ICD-10-CM

## 2023-03-16 DIAGNOSIS — I2510 Atherosclerotic heart disease of native coronary artery without angina pectoris: Secondary | ICD-10-CM | POA: Diagnosis not present

## 2023-03-16 DIAGNOSIS — Z96653 Presence of artificial knee joint, bilateral: Secondary | ICD-10-CM

## 2023-03-16 DIAGNOSIS — K219 Gastro-esophageal reflux disease without esophagitis: Secondary | ICD-10-CM

## 2023-03-16 DIAGNOSIS — I255 Ischemic cardiomyopathy: Secondary | ICD-10-CM | POA: Diagnosis not present

## 2023-03-16 DIAGNOSIS — E039 Hypothyroidism, unspecified: Secondary | ICD-10-CM

## 2023-03-16 DIAGNOSIS — I252 Old myocardial infarction: Secondary | ICD-10-CM

## 2023-03-16 DIAGNOSIS — L989 Disorder of the skin and subcutaneous tissue, unspecified: Secondary | ICD-10-CM | POA: Diagnosis not present

## 2023-03-16 DIAGNOSIS — I351 Nonrheumatic aortic (valve) insufficiency: Secondary | ICD-10-CM | POA: Diagnosis not present

## 2023-03-16 DIAGNOSIS — N1831 Chronic kidney disease, stage 3a: Secondary | ICD-10-CM | POA: Diagnosis not present

## 2023-03-16 DIAGNOSIS — N179 Acute kidney failure, unspecified: Secondary | ICD-10-CM | POA: Diagnosis not present

## 2023-03-16 DIAGNOSIS — Z7984 Long term (current) use of oral hypoglycemic drugs: Secondary | ICD-10-CM

## 2023-03-16 DIAGNOSIS — G43909 Migraine, unspecified, not intractable, without status migrainosus: Secondary | ICD-10-CM

## 2023-03-16 DIAGNOSIS — I130 Hypertensive heart and chronic kidney disease with heart failure and stage 1 through stage 4 chronic kidney disease, or unspecified chronic kidney disease: Secondary | ICD-10-CM | POA: Diagnosis not present

## 2023-03-16 DIAGNOSIS — Z4800 Encounter for change or removal of nonsurgical wound dressing: Secondary | ICD-10-CM | POA: Diagnosis not present

## 2023-03-16 DIAGNOSIS — I447 Left bundle-branch block, unspecified: Secondary | ICD-10-CM | POA: Diagnosis not present

## 2023-03-16 DIAGNOSIS — I6523 Occlusion and stenosis of bilateral carotid arteries: Secondary | ICD-10-CM

## 2023-03-16 DIAGNOSIS — D631 Anemia in chronic kidney disease: Secondary | ICD-10-CM | POA: Diagnosis not present

## 2023-03-16 DIAGNOSIS — Z9842 Cataract extraction status, left eye: Secondary | ICD-10-CM

## 2023-03-16 DIAGNOSIS — Z8673 Personal history of transient ischemic attack (TIA), and cerebral infarction without residual deficits: Secondary | ICD-10-CM

## 2023-03-16 DIAGNOSIS — I5021 Acute systolic (congestive) heart failure: Secondary | ICD-10-CM | POA: Diagnosis not present

## 2023-03-16 DIAGNOSIS — Z9841 Cataract extraction status, right eye: Secondary | ICD-10-CM

## 2023-03-16 DIAGNOSIS — F419 Anxiety disorder, unspecified: Secondary | ICD-10-CM | POA: Diagnosis not present

## 2023-03-16 MED ORDER — LORAZEPAM 1 MG PO TABS: 1.0000 mg | ORAL_TABLET | Freq: Every evening | ORAL | 0 refills | Status: DC | PRN

## 2023-03-16 NOTE — Assessment & Plan Note (Signed)
Controlled.   - Continue Lisinopril 5 mg daily.

## 2023-03-16 NOTE — Addendum Note (Signed)
Addended by: Tillman Abide I on: 03/16/2023 12:36 PM   Modules accepted: Orders

## 2023-03-16 NOTE — Assessment & Plan Note (Signed)
Stable volume status. Shortness of breath stable with NYHA 2b-3 symptoms. Her BNP was elevated but improved to 881 on July 29th. Renal function stable with last creatinine 1.21. Currently on She is intol of beta blockers.  -Continue Farxiga 10mg  daily and Lisinopril 5mg  daily and current dose of Lasix.

## 2023-03-16 NOTE — Patient Instructions (Signed)
Medication Instructions:   Your physician recommends that you continue on your current medications as directed. Please refer to the Current Medication list given to you today.  *If you need a refill on your cardiac medications before your next appointment, please call your pharmacy*   Lab Work: NONE ORDERED  TODAY    If you have labs (blood work) drawn today and your tests are completely normal, you will receive your results only by: MyChart Message (if you have MyChart) OR A paper copy in the mail If you have any lab test that is abnormal or we need to change your treatment, we will call you to review the results.   Testing/Procedures: NONE ORDERED  TODAY     Follow-Up: At [facility], you and your health needs are our priority.  As part of our continuing mission to provide you with exceptional heart care, we have created designated Provider Care Teams.  These Care Teams include your primary Cardiologist (physician) and Advanced Practice Providers (APPs -  Physician Assistants and Nurse Practitioners) who all work together to provide you with the care you need, when you need it.  We recommend signing up for the patient portal called "MyChart".  Sign up information is provided on this After Visit Summary.  MyChart is used to connect with patients for Virtual Visits (Telemedicine).  Patients are able to view lab/test results, encounter notes, upcoming appointments, etc.  Non-urgent messages can be sent to your provider as well.   To learn more about what you can do with MyChart, go to [email or web address].    Your next appointment:  AS SCHEDULED    Provider:   Tonny Bollman, MD

## 2023-03-16 NOTE — Progress Notes (Signed)
Cardiology Office Note:    Date:  03/16/2023  ID:  Leslie [name], DOB 1931-05-09, MRN [id number] PCP: Karie Schwalbe, MD   [facility] Providers Cardiologist:  Tonny Bollman, MD       Patient Profile:      HFmrEF (heart failure with mildly reduced ejection fraction)  Previously HFpEF Non-ischemic cardiomyopathy  LHC 04/22/2006: EF 35-45, no CAD TTE 06/17/2019: EF 30-35, moderate LVH, normal RVSF, moderate LAE, mild MR, mild TR, moderate AI, AV sclerosis, mild PI, mildly elevated PASP, RVSP 30.2 TTE 12/20/2022: Severe abnormal paradoxical septal motion consistent with LBBB, EF 45-50, global HK, mild LVH, GR 1 DD, normal RVSF, normal PASP, RVSP 25.3, mild LAE, mild-moderate MR, moderate AI, AV sclerosis, RAP 3 Coronary artery Ca2+ Mitral Regurgitation TTE 12/2022: mild to mod Aortic Insufficiency  TTE 12/2022: mod Left Bundle Branch Block Bradycardia  Hx of CVA Carotid US 12/06/2022: Bilateral ICA 1-39; right subclavian flow disturbed; beadlike appearance CTA-?  FMD, prominent innominate artery >> Rpt 1 year Hypertension  Hyperlipidemia  Hypothyroidism           Discussed the use of AI scribe software for clinical note transcription with the patient, who gave verbal consent to proceed.  History of Present Illness   A 88 year old patient with a history of CHF and reduced EF presents for follow-up. The patient's EF, previously as low as 30-35%, improved to 45-50% as per an echo in May 2024. The patient was last seen in July following a hospitalization due to a leg injury caused by a goat. During the hospital stay, the patient was treated with IV antibiotics and developed AKI. The patient was noted to be weak and short of breath post-hospitalization, with elevated BNP levels indicating fluid overload. The patient's diuretics were adjusted accordingly, and subsequent BNP levels showed improvement.   The patient reports chronic shortness of breath, which has not worsened but requires  the patient to rest frequently. The patient also reports back pain, which she attributes to a possible pulled muscle or pinched nerve. The patient was prescribed prednisone for six days, which seemed to help with the back pain. The patient also reports a light-headed feeling a few days ago, but this has since resolved. The patient denies any chest discomfort, swelling in the legs, or episodes of dizziness or passing out.      ROS: See the HPI    Studies Reviewed:        Risk Assessment/Calculations:             Physical Exam:   VS:  BP 138/62   Pulse 61   Ht 5\' 7"  (1.702 m)   Wt 146 lb (66.2 kg)   SpO2 98%   BMI 22.87 kg/m    Wt Readings from Last 3 Encounters:  03/16/23 146 lb (66.2 kg)  03/11/23 146 lb (66.2 kg)  02/25/23 142 lb (64.4 kg)    GEN: Well nourished, well developed in no acute distress NECK: No HJR CARDIAC: RRR, no murmurs, rubs, gallops RESPIRATORY:  Clear to auscultation without rales, wheezing or rhonchi  ABDOMEN: Soft EXTREMITIES: Trace ankle edema bilaterally     Assessment and Plan:  Heart failure with mildly reduced ejection fraction (HFmrEF) (HCC) Stable volume status. Shortness of breath stable with NYHA 2b-3 symptoms. Her BNP was elevated but improved to 881 on July 29th. Renal function stable with last creatinine 1.21. Currently on She is intol of beta blockers.  -Continue Farxiga 10mg  daily and Lisinopril 5mg  daily and current  dose of Lasix.  Aortic insufficiency Mild to moderate MR and moderate AI by echocardiogram May 2024. -Consider repeating echo in one year.  Carotid artery disease (HCC) Ultrasound in April 2024 demonstrated decreased flow in right subclavian, mild plaque bilaterally in carotid arteries, and possible fibromuscular dysplasia. Reviewed with Dr. Excell Seltzer. Plan is to repeat US in 2025. -Repeat ultrasound in April 2025.  HTN (hypertension) Controlled. -Continue Lisinopril 5mg  daily.           Dispo:  Return in 2 months (on  05/26/2023) for Scheduled Follow Up, w/ Dr. Excell Seltzer.  Signed, Tereso Newcomer, PA-C

## 2023-03-16 NOTE — Assessment & Plan Note (Signed)
Mild to moderate MR and moderate AI by echocardiogram May 2024. -Consider repeating echo in one year.

## 2023-03-16 NOTE — Assessment & Plan Note (Signed)
Ultrasound in April 2024 demonstrated decreased flow in right subclavian, mild plaque bilaterally in carotid arteries, and possible fibromuscular dysplasia. Reviewed with Dr. Excell Seltzer. Plan is to repeat US in 2025. -Repeat ultrasound in April 2025.

## 2023-03-16 NOTE — Telephone Encounter (Signed)
Prescription Request  03/16/2023  LOV: 03/11/2023  What is the name of the medication or equipment? LORazepam (ATIVAN) 1 MG tablet [[id number]]   Have you contacted your pharmacy to request a refill? No   Which pharmacy would you like this sent to?  [id number] [place], [place] - [address] [place] [address] Phone: [phone] Fax: [phone]     Patient notified that their request is being sent to the clinical staff for review and that they should receive a response within 2 business days.   Please advise at Mobile [phone] (mobile)

## 2023-03-16 NOTE — Telephone Encounter (Signed)
Rx done. 

## 2023-03-17 ENCOUNTER — Telehealth: Payer: Self-pay | Admitting: Internal Medicine

## 2023-03-17 DIAGNOSIS — N179 Acute kidney failure, unspecified: Secondary | ICD-10-CM | POA: Diagnosis not present

## 2023-03-17 DIAGNOSIS — I6529 Occlusion and stenosis of unspecified carotid artery: Secondary | ICD-10-CM | POA: Diagnosis not present

## 2023-03-17 DIAGNOSIS — L989 Disorder of the skin and subcutaneous tissue, unspecified: Secondary | ICD-10-CM | POA: Diagnosis not present

## 2023-03-17 DIAGNOSIS — Z9841 Cataract extraction status, right eye: Secondary | ICD-10-CM | POA: Diagnosis not present

## 2023-03-17 DIAGNOSIS — Z7984 Long term (current) use of oral hypoglycemic drugs: Secondary | ICD-10-CM | POA: Diagnosis not present

## 2023-03-17 DIAGNOSIS — I130 Hypertensive heart and chronic kidney disease with heart failure and stage 1 through stage 4 chronic kidney disease, or unspecified chronic kidney disease: Secondary | ICD-10-CM | POA: Diagnosis not present

## 2023-03-17 DIAGNOSIS — I252 Old myocardial infarction: Secondary | ICD-10-CM | POA: Diagnosis not present

## 2023-03-17 DIAGNOSIS — K219 Gastro-esophageal reflux disease without esophagitis: Secondary | ICD-10-CM | POA: Diagnosis not present

## 2023-03-17 DIAGNOSIS — I447 Left bundle-branch block, unspecified: Secondary | ICD-10-CM | POA: Diagnosis not present

## 2023-03-17 DIAGNOSIS — F39 Unspecified mood [affective] disorder: Secondary | ICD-10-CM | POA: Diagnosis not present

## 2023-03-17 DIAGNOSIS — D631 Anemia in chronic kidney disease: Secondary | ICD-10-CM | POA: Diagnosis not present

## 2023-03-17 DIAGNOSIS — I2510 Atherosclerotic heart disease of native coronary artery without angina pectoris: Secondary | ICD-10-CM | POA: Diagnosis not present

## 2023-03-17 DIAGNOSIS — I255 Ischemic cardiomyopathy: Secondary | ICD-10-CM | POA: Diagnosis not present

## 2023-03-17 DIAGNOSIS — I5021 Acute systolic (congestive) heart failure: Secondary | ICD-10-CM | POA: Diagnosis not present

## 2023-03-17 DIAGNOSIS — Z8673 Personal history of transient ischemic attack (TIA), and cerebral infarction without residual deficits: Secondary | ICD-10-CM | POA: Diagnosis not present

## 2023-03-17 DIAGNOSIS — Z4800 Encounter for change or removal of nonsurgical wound dressing: Secondary | ICD-10-CM | POA: Diagnosis not present

## 2023-03-17 DIAGNOSIS — E039 Hypothyroidism, unspecified: Secondary | ICD-10-CM | POA: Diagnosis not present

## 2023-03-17 DIAGNOSIS — Z96653 Presence of artificial knee joint, bilateral: Secondary | ICD-10-CM | POA: Diagnosis not present

## 2023-03-17 DIAGNOSIS — N1831 Chronic kidney disease, stage 3a: Secondary | ICD-10-CM | POA: Diagnosis not present

## 2023-03-17 DIAGNOSIS — Z9842 Cataract extraction status, left eye: Secondary | ICD-10-CM | POA: Diagnosis not present

## 2023-03-17 DIAGNOSIS — F419 Anxiety disorder, unspecified: Secondary | ICD-10-CM | POA: Diagnosis not present

## 2023-03-17 DIAGNOSIS — G43909 Migraine, unspecified, not intractable, without status migrainosus: Secondary | ICD-10-CM | POA: Diagnosis not present

## 2023-03-17 DIAGNOSIS — Z7902 Long term (current) use of antithrombotics/antiplatelets: Secondary | ICD-10-CM | POA: Diagnosis not present

## 2023-03-17 NOTE — Telephone Encounter (Signed)
Pt called to let Dr. Alphonsus Sias know that the meds, predniSONE (DELTASONE) 20 MG tablet, has been helping. Pt states her back pain has gotten a lot better, especially when she doesn't move around much. Pt asked if Dr. Alphonsus Sias wants her to continue the meds? Call back # [phone]

## 2023-03-17 NOTE — Telephone Encounter (Signed)
Spoke to pt. She will let us know if she starts to have issues again.

## 2023-03-18 DIAGNOSIS — Z4800 Encounter for change or removal of nonsurgical wound dressing: Secondary | ICD-10-CM | POA: Diagnosis not present

## 2023-03-18 DIAGNOSIS — D631 Anemia in chronic kidney disease: Secondary | ICD-10-CM | POA: Diagnosis not present

## 2023-03-18 DIAGNOSIS — I130 Hypertensive heart and chronic kidney disease with heart failure and stage 1 through stage 4 chronic kidney disease, or unspecified chronic kidney disease: Secondary | ICD-10-CM | POA: Diagnosis not present

## 2023-03-18 DIAGNOSIS — N1831 Chronic kidney disease, stage 3a: Secondary | ICD-10-CM | POA: Diagnosis not present

## 2023-03-18 DIAGNOSIS — L989 Disorder of the skin and subcutaneous tissue, unspecified: Secondary | ICD-10-CM | POA: Diagnosis not present

## 2023-03-18 DIAGNOSIS — I5021 Acute systolic (congestive) heart failure: Secondary | ICD-10-CM | POA: Diagnosis not present

## 2023-03-21 ENCOUNTER — Telehealth: Payer: Self-pay | Admitting: Internal Medicine

## 2023-03-21 DIAGNOSIS — L989 Disorder of the skin and subcutaneous tissue, unspecified: Secondary | ICD-10-CM | POA: Diagnosis not present

## 2023-03-21 DIAGNOSIS — I130 Hypertensive heart and chronic kidney disease with heart failure and stage 1 through stage 4 chronic kidney disease, or unspecified chronic kidney disease: Secondary | ICD-10-CM | POA: Diagnosis not present

## 2023-03-21 DIAGNOSIS — Z4800 Encounter for change or removal of nonsurgical wound dressing: Secondary | ICD-10-CM | POA: Diagnosis not present

## 2023-03-21 DIAGNOSIS — I5021 Acute systolic (congestive) heart failure: Secondary | ICD-10-CM | POA: Diagnosis not present

## 2023-03-21 DIAGNOSIS — D631 Anemia in chronic kidney disease: Secondary | ICD-10-CM | POA: Diagnosis not present

## 2023-03-21 DIAGNOSIS — N1831 Chronic kidney disease, stage 3a: Secondary | ICD-10-CM | POA: Diagnosis not present

## 2023-03-21 NOTE — Telephone Encounter (Signed)
Darl Pikes from [facility] home health called requesting verbal orders to discharge patient from wound care, states the wound has healed. Darl Pikes can be reached at [phone]

## 2023-03-21 NOTE — Telephone Encounter (Signed)
Verbal orders given to Susan.  

## 2023-03-28 DIAGNOSIS — D631 Anemia in chronic kidney disease: Secondary | ICD-10-CM | POA: Diagnosis not present

## 2023-03-28 DIAGNOSIS — L989 Disorder of the skin and subcutaneous tissue, unspecified: Secondary | ICD-10-CM | POA: Diagnosis not present

## 2023-03-28 DIAGNOSIS — Z4800 Encounter for change or removal of nonsurgical wound dressing: Secondary | ICD-10-CM | POA: Diagnosis not present

## 2023-03-28 DIAGNOSIS — N1831 Chronic kidney disease, stage 3a: Secondary | ICD-10-CM | POA: Diagnosis not present

## 2023-03-28 DIAGNOSIS — I5021 Acute systolic (congestive) heart failure: Secondary | ICD-10-CM | POA: Diagnosis not present

## 2023-03-28 DIAGNOSIS — I130 Hypertensive heart and chronic kidney disease with heart failure and stage 1 through stage 4 chronic kidney disease, or unspecified chronic kidney disease: Secondary | ICD-10-CM | POA: Diagnosis not present

## 2023-04-01 ENCOUNTER — Other Ambulatory Visit: Payer: Self-pay

## 2023-04-01 MED ORDER — FUROSEMIDE 20 MG PO TABS: ORAL_TABLET | ORAL | 3 refills | Status: DC

## 2023-04-04 DIAGNOSIS — D631 Anemia in chronic kidney disease: Secondary | ICD-10-CM | POA: Diagnosis not present

## 2023-04-04 DIAGNOSIS — Z4800 Encounter for change or removal of nonsurgical wound dressing: Secondary | ICD-10-CM | POA: Diagnosis not present

## 2023-04-04 DIAGNOSIS — N1831 Chronic kidney disease, stage 3a: Secondary | ICD-10-CM | POA: Diagnosis not present

## 2023-04-04 DIAGNOSIS — I130 Hypertensive heart and chronic kidney disease with heart failure and stage 1 through stage 4 chronic kidney disease, or unspecified chronic kidney disease: Secondary | ICD-10-CM | POA: Diagnosis not present

## 2023-04-04 DIAGNOSIS — L989 Disorder of the skin and subcutaneous tissue, unspecified: Secondary | ICD-10-CM | POA: Diagnosis not present

## 2023-04-04 DIAGNOSIS — I5021 Acute systolic (congestive) heart failure: Secondary | ICD-10-CM | POA: Diagnosis not present

## 2023-04-08 ENCOUNTER — Encounter: Payer: Self-pay | Admitting: Emergency Medicine

## 2023-04-08 ENCOUNTER — Emergency Department: Payer: Medicare Other

## 2023-04-08 ENCOUNTER — Other Ambulatory Visit: Payer: Self-pay

## 2023-04-08 ENCOUNTER — Emergency Department
Admission: EM | Admit: 2023-04-08 | Discharge: 2023-04-08 | Disposition: A | Discharge status: Home / Self Care | Payer: Medicare Other | Attending: Student in an Organized Health Care Education/Training Program | Admitting: Student in an Organized Health Care Education/Training Program

## 2023-04-08 DIAGNOSIS — R002 Palpitations: Secondary | ICD-10-CM | POA: Diagnosis not present

## 2023-04-08 DIAGNOSIS — I110 Hypertensive heart disease with heart failure: Secondary | ICD-10-CM | POA: Diagnosis not present

## 2023-04-08 DIAGNOSIS — F419 Anxiety disorder, unspecified: Secondary | ICD-10-CM | POA: Diagnosis not present

## 2023-04-08 DIAGNOSIS — I700 Atherosclerosis of aorta: Secondary | ICD-10-CM | POA: Diagnosis not present

## 2023-04-08 DIAGNOSIS — R0602 Shortness of breath: Secondary | ICD-10-CM | POA: Diagnosis not present

## 2023-04-08 DIAGNOSIS — I509 Heart failure, unspecified: Secondary | ICD-10-CM | POA: Diagnosis not present

## 2023-04-08 LAB — BASIC METABOLIC PANEL
Anion gap: 9 (ref 5–15)
BUN: 16 mg/dL (ref 8–23)
CO2: 25 mmol/L (ref 22–32)
Calcium: 8.8 mg/dL — ABNORMAL LOW (ref 8.9–10.3)
Chloride: 106 mmol/L (ref 98–111)
Creatinine, Ser: 1.32 mg/dL — ABNORMAL HIGH (ref 0.44–1.00)
GFR, Estimated: 38 mL/min — ABNORMAL LOW (ref >60)
Glucose, Bld: 114 mg/dL — ABNORMAL HIGH (ref 70–99)
Potassium: 3.8 mmol/L (ref 3.5–5.1)
Sodium: 140 mmol/L (ref 135–145)

## 2023-04-08 LAB — TROPONIN I (HIGH SENSITIVITY)
Troponin I (High Sensitivity): 9 ng/L (ref <18)
Troponin I (High Sensitivity): 11 ng/L (ref <18)

## 2023-04-08 LAB — BRAIN NATRIURETIC PEPTIDE: B Natriuretic Peptide: 149 pg/mL — ABNORMAL HIGH (ref 0.0–100.0)

## 2023-04-08 LAB — CBC
HCT: 40.4 % (ref 36.0–46.0)
Hemoglobin: 12.6 g/dL (ref 12.0–15.0)
MCH: 29.6 pg (ref 26.0–34.0)
MCHC: 31.2 g/dL (ref 30.0–36.0)
MCV: 94.8 fL (ref 80.0–100.0)
Platelets: 206 10*3/uL (ref 150–400)
RBC: 4.26 MIL/uL (ref 3.87–5.11)
RDW: 14.7 % (ref 11.5–15.5)
WBC: 4.6 10*3/uL (ref 4.0–10.5)
nRBC: 0 % (ref 0.0–0.2)

## 2023-04-08 NOTE — ED Provider Notes (Signed)
[facility] Provider Note    Event Date/Time   First MD Initiated Contact with Patient 04/08/23 [phone]     (approximate)   History   Shortness of Breath   HPI  Leslie [name] is a 89 y.o. female with a history of CHF  , Anxiety, hypertension presents to the ER for evaluation of palpitations and episode of shortness of breath that occurred last night.  Feels better at this point.  Currently does not have family in town as they are on vacation.  States that she started feeling anxious she did not have anyone to call.  She denies any chest pain.  No lower extremity swelling.  Denies any orthopnea.  No fevers or chills.   Physical Exam   Triage Vital Signs: ED Triage Vitals  Encounter Vitals Group     BP 04/08/23 0239 (!) 160/66     Systolic BP Percentile --      Diastolic BP Percentile --      Pulse Rate 04/08/23 0239 69     Resp 04/08/23 0239 20     Temp 04/08/23 0239 98.2 F (36.8 C)     Temp Source 04/08/23 0239 Oral     SpO2 04/08/23 0239 93 %     Weight 04/08/23 0240 143 lb (64.9 kg)     Height 04/08/23 0240 5\' 7"  (1.702 m)     Head Circumference --      Peak Flow --      Pain Score 04/08/23 0240 0     Pain Loc --      Pain Education --      Exclude from Growth Chart --     Most recent vital signs: Vitals:   04/08/23 0239 04/08/23 0612  BP: (!) 160/66 (!) 149/58  Pulse: 69 61  Resp: 20 12  Temp: 98.2 F (36.8 C) 97.9 F (36.6 C)  SpO2: 93% 97%     Constitutional: Alert  Eyes: Conjunctivae are normal.  Head: Atraumatic. Nose: No congestion/rhinnorhea. Mouth/Throat: Mucous membranes are moist.   Neck: Painless ROM.  Cardiovascular:   Good peripheral circulation.  No murmurs or gallops. Respiratory: Normal respiratory effort.  No retractions.  No crackles no wheeze Gastrointestinal: Soft and nontender.  Musculoskeletal:  no deformity Neurologic:  MAE spontaneously. No gross focal neurologic deficits are appreciated.  Skin:   Skin is warm, dry and intact. No rash noted. Psychiatric: Mood and affect are normal. Speech and behavior are normal.    ED Results / Procedures / Treatments   Labs (all labs ordered are listed, but only abnormal results are displayed) Labs Reviewed  BASIC METABOLIC PANEL - Abnormal; Notable for the following components:      Result Value   Glucose, Bld 114 (*)    Creatinine, Ser 1.32 (*)    Calcium 8.8 (*)    GFR, Estimated 38 (*)    All other components within normal limits  BRAIN NATRIURETIC PEPTIDE - Abnormal; Notable for the following components:   B Natriuretic Peptide 149.0 (*)    All other components within normal limits  CBC  TROPONIN I (HIGH SENSITIVITY)  TROPONIN I (HIGH SENSITIVITY)     EKG  ED ECG REPORT I, Willy Eddy, the attending physician, personally viewed and interpreted this ECG.   Date: 04/08/2023  EKG Time: 2:42  Rate: 70  Rhythm: sinus  Axis: left  Intervals:lbbb  ST&T Change: no stemi,     RADIOLOGY Please see ED Course for my review  and interpretation.  I personally reviewed all radiographic images ordered to evaluate for the above acute complaints and reviewed radiology reports and findings.  These findings were personally discussed with the patient.  Please see medical record for radiology report.    PROCEDURES:  Critical Care performed:   Procedures   MEDICATIONS ORDERED IN ED: Medications - No data to display   IMPRESSION / MDM / ASSESSMENT AND PLAN / ED COURSE  I reviewed the triage vital signs and the nursing notes.                              Differential diagnosis includes, but is not limited to, Asthma, copd, CHF, pna, ptx, malignancy, Pe, anemia  Patient presenting to the ER for evaluation of symptoms as described above.  Based on symptoms, risk factors and considered above differential, this presenting complaint could reflect a potentially life-threatening illness therefore the patient will be placed on  continuous pulse oximetry and telemetry for monitoring.  Laboratory evaluation will be sent to evaluate for the above complaints.  Patient currently well-appearing not requiring any supplemental oxygen satting 99% recomment on room air.  No fever.  No white count.  Does have cardiac risk factors but serial troponins are negative.  EKG is not acutely ischemic appearing.  Chest x-ray on my review and interpretation without evidence of consolidation.  No findings of pulmonary edema or effusion.  This is not consistent with PE given the brief episodic nature of it no hypoxia no tachycardia.  Given her reassuring workup I do believe she stable and appropriate for outpatient follow-up as this seems to been a brief episode she is established with cardiology.  We discussed signs and symptoms for which patient should return to the ER.       FINAL CLINICAL IMPRESSION(S) / ED DIAGNOSES   Final diagnoses:  Palpitations  Shortness of breath     Rx / DC Orders   ED Discharge Orders     None        Note:  This document was prepared using Dragon voice recognition software and may include unintentional dictation errors.    Willy Eddy, MD 04/08/23 [phone]

## 2023-04-08 NOTE — ED Triage Notes (Signed)
Pt arrived via POV with neighbor, pt lives alone, family currently out of town, neighbor states pt has hx of CHF, neighbor works for [employer] and gave pt some oxygen 4L which helped some, pt c/o shortness of breath with exertion, pt states she laying down and felt like her heart was beating fast.

## 2023-04-18 ENCOUNTER — Other Ambulatory Visit: Payer: Self-pay | Admitting: Internal Medicine

## 2023-04-18 NOTE — Telephone Encounter (Signed)
Last filled 03-16-23 #30 Last OV 03-11-23 Next OV 06-20-23 [facility]

## 2023-04-22 ENCOUNTER — Telehealth: Payer: Self-pay | Admitting: *Deleted

## 2023-04-22 NOTE — Telephone Encounter (Signed)
Transition Care Management Unsuccessful Follow-up Telephone Call  Date of discharge and from where:  [facility]  04/08/2023  Attempts:  1st Attempt  Reason for unsuccessful TCM follow-up call:  No answer/busy

## 2023-04-27 ENCOUNTER — Telehealth: Payer: Self-pay | Admitting: *Deleted

## 2023-04-27 NOTE — Telephone Encounter (Signed)
.  Transition Care Management Unsuccessful Follow-up Telephone Call  Date of discharge and from where:  [facility]  04/08/2023  Attempts:  2nd Attempt  Reason for unsuccessful TCM follow-up call:  Left voice message

## 2023-05-18 ENCOUNTER — Other Ambulatory Visit: Payer: Self-pay | Admitting: Internal Medicine

## 2023-05-18 NOTE — Telephone Encounter (Signed)
Last filled 04-18-23 #30 Last OV 03-11-23 Next OV 06-20-23 [facility]

## 2023-05-26 ENCOUNTER — Ambulatory Visit: Payer: Medicare Other | Attending: Cardiovascular Disease | Admitting: Cardiovascular Disease

## 2023-05-26 ENCOUNTER — Encounter: Payer: Self-pay | Admitting: Cardiovascular Disease

## 2023-05-26 VITALS — BP 130/82 | HR 61 | Ht 67.0 in | Wt 146.0 lb

## 2023-05-26 DIAGNOSIS — I6523 Occlusion and stenosis of bilateral carotid arteries: Secondary | ICD-10-CM | POA: Diagnosis not present

## 2023-05-26 DIAGNOSIS — I5022 Chronic systolic (congestive) heart failure: Secondary | ICD-10-CM | POA: Diagnosis not present

## 2023-05-26 DIAGNOSIS — I10 Essential (primary) hypertension: Secondary | ICD-10-CM | POA: Diagnosis not present

## 2023-05-26 DIAGNOSIS — I447 Left bundle-branch block, unspecified: Secondary | ICD-10-CM | POA: Insufficient documentation

## 2023-05-26 DIAGNOSIS — Z79899 Other long term (current) drug therapy: Secondary | ICD-10-CM | POA: Insufficient documentation

## 2023-05-26 MED ORDER — FUROSEMIDE 20 MG PO TABS: 20.0000 mg | ORAL_TABLET | Freq: Every day | ORAL | 3 refills | Status: DC

## 2023-05-26 NOTE — Patient Instructions (Signed)
Medication Instructions:  INCREASE Furosemide to 20mg  daily *If you need a refill on your cardiac medications before your next appointment, please call your pharmacy*  Lab Work: BMET in 3 weeks If you have labs (blood work) drawn today and your tests are completely normal, you will receive your results only by: MyChart Message (if you have MyChart) OR A paper copy in the mail If you have any lab test that is abnormal or we need to change your treatment, we will call you to review the results.  Follow-Up: At [facility], you and your health needs are our priority.  As part of our continuing mission to provide you with exceptional heart care, we have created designated Provider Care Teams.  These Care Teams include your primary Cardiologist (physician) and Advanced Practice Providers (APPs -  Physician Assistants and Nurse Practitioners) who all work together to provide you with the care you need, when you need it.  We recommend signing up for the patient portal called "MyChart".  Sign up information is provided on this After Visit Summary.  MyChart is used to connect with patients for Virtual Visits (Telemedicine).  Patients are able to view lab/test results, encounter notes, upcoming appointments, etc.  Non-urgent messages can be sent to your provider as well.   To learn more about what you can do with MyChart, go to [email or web address].    Your next appointment:   4 month(s)  Provider:   Tereso Newcomer, PA

## 2023-05-26 NOTE — Progress Notes (Signed)
Cardiology Office Note:    Date:  05/26/2023   ID:  Leslie [name], DOB [birth year], MRN [id number]  PCP:  Karie Schwalbe, MD   [facility] Providers Cardiologist:  Tonny Bollman, MD     Referring MD: Karie Schwalbe, MD   Chief Complaint  Patient presents with   Shortness of Breath    History of Present Illness:    Leslie [name] is a 89 y.o. female presenting for follow-up of heart failure with mildly reduced ejection fraction and mild to moderate valvular heart disease.  The patient is here with her son today.  She has been doing okay from a cardiac perspective but does complain of some exertional dyspnea and swelling in her legs when she has to be up on her feet.  She has no orthopnea, PND, or chest pain.  She wonders about whether she could go back to taking furosemide every day as her leg swelling was better when she was taking this on a daily basis.  She is currently taking a just on Monday, Wednesday, and Fridays.   Current Medications: Current Meds  Medication Sig   Calcium Carbonate-Vit D-Min (CALCIUM 1200 PO) Take 1 tablet by mouth daily.   cholecalciferol (VITAMIN D3) 25 MCG (1000 UNIT) tablet Take 1,000 Units by mouth daily.   citalopram (CELEXA) 20 MG tablet TAKE 1 TABLET BY MOUTH EVERY DAY   clopidogrel (PLAVIX) 75 MG tablet TAKE 1 TABLET BY MOUTH EVERY DAY   cyanocobalamin (VITAMIN B12) 1000 MCG/ML injection INJECT 1 ML (1,000 MCG) INTRAMUSCULARLY EVERY 30 DAYS   dapagliflozin propanediol (FARXIGA) 10 MG TABS tablet Take 1 tablet (10 mg total) by mouth daily before breakfast.   furosemide (LASIX) 20 MG tablet Take 1 tablet (20 mg total) by mouth daily.   levothyroxine (SYNTHROID) 75 MCG tablet TAKE 1 TABLET BY MOUTH EVERY DAY   lisinopril (ZESTRIL) 5 MG tablet Take 1 tablet (5 mg total) by mouth daily.   LORazepam (ATIVAN) 1 MG tablet TAKE 1 TABLET (1 MG TOTAL) BY MOUTH AT BEDTIME AS NEEDED FOR SLEEP.   nitroGLYCERIN (NITROSTAT) 0.4 MG SL tablet  Place 1 tablet (0.4 mg total) under the tongue every 5 (five) minutes as needed for chest pain.   pantoprazole (PROTONIX) 40 MG tablet Take 1 tablet (40 mg total) by mouth daily.   Polyvinyl Alcohol-Povidone (REFRESH OP) Place 1 drop into both eyes in the morning, at noon, in the evening, and at bedtime.   SYRINGE-NEEDLE, DISP, 3 ML (BD INTEGRA SYRINGE) 25G X 1" 3 ML MISC USE 1 EVERY 30 (THIRTY) DAYS. WITH VITAMIN B12 INJECTION   [DISCONTINUED] furosemide (LASIX) 20 MG tablet TAKE 1 TABLET BY MOUTH DAILYTAKE 1 TABLET BY MOUTH DAILY (Patient taking differently: TAKE 1 TABLET BY MOUTH ON MONDAY, WEDNESDAY, FRIDAY THREE DAYS A WEEK.)   [DISCONTINUED] predniSONE (DELTASONE) 20 MG tablet Take 2 tablets (40 mg total) by mouth daily. For 3 days, then 1 tab daily for 3 days     Allergies:   Sulfa antibiotics, Cephalexin, Dilaudid [hydromorphone hcl], and Xopenex [levalbuterol hcl]   ROS:   Please see the history of present illness.    All other systems reviewed and are negative.  EKGs/Labs/Other Studies Reviewed:    The following studies were reviewed today: Cardiac Studies & Procedures       ECHOCARDIOGRAM  ECHOCARDIOGRAM COMPLETE 12/20/2022  Narrative ECHOCARDIOGRAM REPORT    Patient Name:   Leslie [name] Date of Exam: 12/20/2022 Medical Rec #:  Cardiology Office Note:    Date:  05/26/2023   ID:  Leslie [name], DOB [birth year], MRN [id number]  PCP:  Karie Schwalbe, MD   [facility] Providers Cardiologist:  Tonny Bollman, MD     Referring MD: Karie Schwalbe, MD   Chief Complaint  Patient presents with   Shortness of Breath    History of Present Illness:    Leslie [name] is a 89 y.o. female presenting for follow-up of heart failure with mildly reduced ejection fraction and mild to moderate valvular heart disease.  The patient is here with her son today.  She has been doing okay from a cardiac perspective but does complain of some exertional dyspnea and swelling in her legs when she has to be up on her feet.  She has no orthopnea, PND, or chest pain.  She wonders about whether she could go back to taking furosemide every day as her leg swelling was better when she was taking this on a daily basis.  She is currently taking a just on Monday, Wednesday, and Fridays.   Current Medications: Current Meds  Medication Sig   Calcium Carbonate-Vit D-Min (CALCIUM 1200 PO) Take 1 tablet by mouth daily.   cholecalciferol (VITAMIN D3) 25 MCG (1000 UNIT) tablet Take 1,000 Units by mouth daily.   citalopram (CELEXA) 20 MG tablet TAKE 1 TABLET BY MOUTH EVERY DAY   clopidogrel (PLAVIX) 75 MG tablet TAKE 1 TABLET BY MOUTH EVERY DAY   cyanocobalamin (VITAMIN B12) 1000 MCG/ML injection INJECT 1 ML (1,000 MCG) INTRAMUSCULARLY EVERY 30 DAYS   dapagliflozin propanediol (FARXIGA) 10 MG TABS tablet Take 1 tablet (10 mg total) by mouth daily before breakfast.   furosemide (LASIX) 20 MG tablet Take 1 tablet (20 mg total) by mouth daily.   levothyroxine (SYNTHROID) 75 MCG tablet TAKE 1 TABLET BY MOUTH EVERY DAY   lisinopril (ZESTRIL) 5 MG tablet Take 1 tablet (5 mg total) by mouth daily.   LORazepam (ATIVAN) 1 MG tablet TAKE 1 TABLET (1 MG TOTAL) BY MOUTH AT BEDTIME AS NEEDED FOR SLEEP.   nitroGLYCERIN (NITROSTAT) 0.4 MG SL tablet  Place 1 tablet (0.4 mg total) under the tongue every 5 (five) minutes as needed for chest pain.   pantoprazole (PROTONIX) 40 MG tablet Take 1 tablet (40 mg total) by mouth daily.   Polyvinyl Alcohol-Povidone (REFRESH OP) Place 1 drop into both eyes in the morning, at noon, in the evening, and at bedtime.   SYRINGE-NEEDLE, DISP, 3 ML (BD INTEGRA SYRINGE) 25G X 1" 3 ML MISC USE 1 EVERY 30 (THIRTY) DAYS. WITH VITAMIN B12 INJECTION   [DISCONTINUED] furosemide (LASIX) 20 MG tablet TAKE 1 TABLET BY MOUTH DAILYTAKE 1 TABLET BY MOUTH DAILY (Patient taking differently: TAKE 1 TABLET BY MOUTH ON MONDAY, WEDNESDAY, FRIDAY THREE DAYS A WEEK.)   [DISCONTINUED] predniSONE (DELTASONE) 20 MG tablet Take 2 tablets (40 mg total) by mouth daily. For 3 days, then 1 tab daily for 3 days     Allergies:   Sulfa antibiotics, Cephalexin, Dilaudid [hydromorphone hcl], and Xopenex [levalbuterol hcl]   ROS:   Please see the history of present illness.    All other systems reviewed and are negative.  EKGs/Labs/Other Studies Reviewed:    The following studies were reviewed today: Cardiac Studies & Procedures       ECHOCARDIOGRAM  ECHOCARDIOGRAM COMPLETE 12/20/2022  Narrative ECHOCARDIOGRAM REPORT    Patient Name:   Leslie [name] Date of Exam: 12/20/2022 Medical Rec #:  Cardiology Office Note:    Date:  05/26/2023   ID:  Leslie [name], DOB [birth year], MRN [id number]  PCP:  Karie Schwalbe, MD   [facility] Providers Cardiologist:  Tonny Bollman, MD     Referring MD: Karie Schwalbe, MD   Chief Complaint  Patient presents with   Shortness of Breath    History of Present Illness:    Leslie [name] is a 89 y.o. female presenting for follow-up of heart failure with mildly reduced ejection fraction and mild to moderate valvular heart disease.  The patient is here with her son today.  She has been doing okay from a cardiac perspective but does complain of some exertional dyspnea and swelling in her legs when she has to be up on her feet.  She has no orthopnea, PND, or chest pain.  She wonders about whether she could go back to taking furosemide every day as her leg swelling was better when she was taking this on a daily basis.  She is currently taking a just on Monday, Wednesday, and Fridays.   Current Medications: Current Meds  Medication Sig   Calcium Carbonate-Vit D-Min (CALCIUM 1200 PO) Take 1 tablet by mouth daily.   cholecalciferol (VITAMIN D3) 25 MCG (1000 UNIT) tablet Take 1,000 Units by mouth daily.   citalopram (CELEXA) 20 MG tablet TAKE 1 TABLET BY MOUTH EVERY DAY   clopidogrel (PLAVIX) 75 MG tablet TAKE 1 TABLET BY MOUTH EVERY DAY   cyanocobalamin (VITAMIN B12) 1000 MCG/ML injection INJECT 1 ML (1,000 MCG) INTRAMUSCULARLY EVERY 30 DAYS   dapagliflozin propanediol (FARXIGA) 10 MG TABS tablet Take 1 tablet (10 mg total) by mouth daily before breakfast.   furosemide (LASIX) 20 MG tablet Take 1 tablet (20 mg total) by mouth daily.   levothyroxine (SYNTHROID) 75 MCG tablet TAKE 1 TABLET BY MOUTH EVERY DAY   lisinopril (ZESTRIL) 5 MG tablet Take 1 tablet (5 mg total) by mouth daily.   LORazepam (ATIVAN) 1 MG tablet TAKE 1 TABLET (1 MG TOTAL) BY MOUTH AT BEDTIME AS NEEDED FOR SLEEP.   nitroGLYCERIN (NITROSTAT) 0.4 MG SL tablet  Place 1 tablet (0.4 mg total) under the tongue every 5 (five) minutes as needed for chest pain.   pantoprazole (PROTONIX) 40 MG tablet Take 1 tablet (40 mg total) by mouth daily.   Polyvinyl Alcohol-Povidone (REFRESH OP) Place 1 drop into both eyes in the morning, at noon, in the evening, and at bedtime.   SYRINGE-NEEDLE, DISP, 3 ML (BD INTEGRA SYRINGE) 25G X 1" 3 ML MISC USE 1 EVERY 30 (THIRTY) DAYS. WITH VITAMIN B12 INJECTION   [DISCONTINUED] furosemide (LASIX) 20 MG tablet TAKE 1 TABLET BY MOUTH DAILYTAKE 1 TABLET BY MOUTH DAILY (Patient taking differently: TAKE 1 TABLET BY MOUTH ON MONDAY, WEDNESDAY, FRIDAY THREE DAYS A WEEK.)   [DISCONTINUED] predniSONE (DELTASONE) 20 MG tablet Take 2 tablets (40 mg total) by mouth daily. For 3 days, then 1 tab daily for 3 days     Allergies:   Sulfa antibiotics, Cephalexin, Dilaudid [hydromorphone hcl], and Xopenex [levalbuterol hcl]   ROS:   Please see the history of present illness.    All other systems reviewed and are negative.  EKGs/Labs/Other Studies Reviewed:    The following studies were reviewed today: Cardiac Studies & Procedures       ECHOCARDIOGRAM  ECHOCARDIOGRAM COMPLETE 12/20/2022  Narrative ECHOCARDIOGRAM REPORT    Patient Name:   Leslie [name] Date of Exam: 12/20/2022 Medical Rec #:  Cardiology Office Note:    Date:  05/26/2023   ID:  Leslie [name], DOB [birth year], MRN [id number]  PCP:  Karie Schwalbe, MD   [facility] Providers Cardiologist:  Tonny Bollman, MD     Referring MD: Karie Schwalbe, MD   Chief Complaint  Patient presents with   Shortness of Breath    History of Present Illness:    Leslie [name] is a 89 y.o. female presenting for follow-up of heart failure with mildly reduced ejection fraction and mild to moderate valvular heart disease.  The patient is here with her son today.  She has been doing okay from a cardiac perspective but does complain of some exertional dyspnea and swelling in her legs when she has to be up on her feet.  She has no orthopnea, PND, or chest pain.  She wonders about whether she could go back to taking furosemide every day as her leg swelling was better when she was taking this on a daily basis.  She is currently taking a just on Monday, Wednesday, and Fridays.   Current Medications: Current Meds  Medication Sig   Calcium Carbonate-Vit D-Min (CALCIUM 1200 PO) Take 1 tablet by mouth daily.   cholecalciferol (VITAMIN D3) 25 MCG (1000 UNIT) tablet Take 1,000 Units by mouth daily.   citalopram (CELEXA) 20 MG tablet TAKE 1 TABLET BY MOUTH EVERY DAY   clopidogrel (PLAVIX) 75 MG tablet TAKE 1 TABLET BY MOUTH EVERY DAY   cyanocobalamin (VITAMIN B12) 1000 MCG/ML injection INJECT 1 ML (1,000 MCG) INTRAMUSCULARLY EVERY 30 DAYS   dapagliflozin propanediol (FARXIGA) 10 MG TABS tablet Take 1 tablet (10 mg total) by mouth daily before breakfast.   furosemide (LASIX) 20 MG tablet Take 1 tablet (20 mg total) by mouth daily.   levothyroxine (SYNTHROID) 75 MCG tablet TAKE 1 TABLET BY MOUTH EVERY DAY   lisinopril (ZESTRIL) 5 MG tablet Take 1 tablet (5 mg total) by mouth daily.   LORazepam (ATIVAN) 1 MG tablet TAKE 1 TABLET (1 MG TOTAL) BY MOUTH AT BEDTIME AS NEEDED FOR SLEEP.   nitroGLYCERIN (NITROSTAT) 0.4 MG SL tablet  Place 1 tablet (0.4 mg total) under the tongue every 5 (five) minutes as needed for chest pain.   pantoprazole (PROTONIX) 40 MG tablet Take 1 tablet (40 mg total) by mouth daily.   Polyvinyl Alcohol-Povidone (REFRESH OP) Place 1 drop into both eyes in the morning, at noon, in the evening, and at bedtime.   SYRINGE-NEEDLE, DISP, 3 ML (BD INTEGRA SYRINGE) 25G X 1" 3 ML MISC USE 1 EVERY 30 (THIRTY) DAYS. WITH VITAMIN B12 INJECTION   [DISCONTINUED] furosemide (LASIX) 20 MG tablet TAKE 1 TABLET BY MOUTH DAILYTAKE 1 TABLET BY MOUTH DAILY (Patient taking differently: TAKE 1 TABLET BY MOUTH ON MONDAY, WEDNESDAY, FRIDAY THREE DAYS A WEEK.)   [DISCONTINUED] predniSONE (DELTASONE) 20 MG tablet Take 2 tablets (40 mg total) by mouth daily. For 3 days, then 1 tab daily for 3 days     Allergies:   Sulfa antibiotics, Cephalexin, Dilaudid [hydromorphone hcl], and Xopenex [levalbuterol hcl]   ROS:   Please see the history of present illness.    All other systems reviewed and are negative.  EKGs/Labs/Other Studies Reviewed:    The following studies were reviewed today: Cardiac Studies & Procedures       ECHOCARDIOGRAM  ECHOCARDIOGRAM COMPLETE 12/20/2022  Narrative ECHOCARDIOGRAM REPORT    Patient Name:   Leslie [name] Date of Exam: 12/20/2022 Medical Rec #:  [id number]      Height:       66.0 in Accession #:    [id number]     Weight:       152.0 lb Date of Birth:  [birth year]      BSA:          1.780 m Patient Age:    89 years       BP:           120/60 mmHg Patient Gender: F              HR:           56 bpm. Exam Location:  [address]  Procedure: 2D Echo, 3D Echo, Cardiac Doppler and Color Doppler  Indications:    I50.31 CHF  History:        Patient has prior history of Echocardiogram examinations, most recent 06/27/2019. CHF, CAD and Previous Myocardial Infarction, Stroke, Arrythmias:LBBB, Signs/Symptoms:Shortness of Breath and Fatigue; Risk Factors:Hypertension and Family History  of Coronary Artery Disease. CHF 30-35%.  Sonographer:    Farrel Conners RDCS Referring Phys: Sherol Dade ROSS  IMPRESSIONS   1. Severe Abnormal (paradoxical) septal motion, consistent with left bundle branch block. Left ventricular ejection fraction, by estimation, is 45 to 50%. The left ventricle has mildly decreased function. The left ventricle demonstrates global hypokinesis. The left ventricular internal cavity size was mildly dilated. There is mild left ventricular hypertrophy of the basal-septal segment. Left ventricular diastolic parameters are consistent with Grade I diastolic dysfunction (impaired relaxation). 2. Right ventricular systolic function is normal. The right ventricular size is normal. There is normal pulmonary artery systolic pressure. The estimated right ventricular systolic pressure is 25.3 mmHg. 3. Left atrial size was mildly dilated. 4. The mitral valve is normal in structure. Mild to moderate mitral valve regurgitation. No evidence of mitral stenosis. 5. The aortic valve is tricuspid. Aortic valve regurgitation is moderate. Aortic valve sclerosis/calcification is present, without any evidence of aortic stenosis. Aortic regurgitation PHT measures 411 msec. 6. The inferior vena cava is normal in size with greater than 50% respiratory variability, suggesting right atrial pressure of 3 mmHg.  FINDINGS Left Ventricle: Left ventricular ejection fraction, by estimation, is 45 to 50%. The left ventricle has mildly decreased function. The left ventricle demonstrates global hypokinesis. 3D ejection fraction reviewed and evaluated as part of the interpretation. Alternate measurement of EF is felt to be most reflective of LV function. The left ventricular internal cavity size was mildly dilated. There is mild left ventricular hypertrophy of the basal-septal segment. Abnormal (paradoxical) septal motion, consistent with left bundle branch block. Left ventricular diastolic parameters  are consistent with Grade I diastolic dysfunction (impaired relaxation). Normal left ventricular filling pressure.  Right Ventricle: The right ventricular size is normal. No increase in right ventricular wall thickness. Right ventricular systolic function is normal. There is normal pulmonary artery systolic pressure. The tricuspid regurgitant velocity is 2.36 m/s, and with an assumed right atrial pressure of 3 mmHg, the estimated right ventricular systolic pressure is 25.3 mmHg.  Left Atrium: Left atrial size was mildly dilated.  Right Atrium: Right atrial size was normal in size.  Pericardium: There is no evidence of pericardial effusion.  Mitral Valve: The mitral valve is normal in structure. Mild to moderate mitral annular calcification. Mild to moderate mitral valve regurgitation. No evidence of mitral valve stenosis.  Tricuspid Valve: The tricuspid valve is normal in structure. Tricuspid valve regurgitation is trivial. No evidence of tricuspid stenosis.  Aortic Valve:

## 2023-05-26 NOTE — Assessment & Plan Note (Signed)
Associated with mild cardiomyopathy.  LVEF 45 to 50%.  Tolerating a low-dose of lisinopril and Farxiga.  Continue current therapy.  Minimize medications as much as possible with her advanced age and frailty.

## 2023-05-26 NOTE — Assessment & Plan Note (Signed)
Increase furosemide to 20 mg daily.  Metabolic panel in 3 weeks.  Follow-up APP in 4 months.  Continue Farxiga.

## 2023-05-26 NOTE — Assessment & Plan Note (Signed)
No recent neurologic symptoms.  Continue clopidogrel.

## 2023-06-05 ENCOUNTER — Other Ambulatory Visit: Payer: Self-pay | Admitting: Internal Medicine

## 2023-06-20 ENCOUNTER — Other Ambulatory Visit: Payer: Medicare Other

## 2023-06-20 ENCOUNTER — Ambulatory Visit (INDEPENDENT_AMBULATORY_CARE_PROVIDER_SITE_OTHER): Payer: Medicare Other | Admitting: Internal Medicine

## 2023-06-20 ENCOUNTER — Encounter: Payer: Self-pay | Admitting: Internal Medicine

## 2023-06-20 VITALS — BP 130/80 | HR 60 | Temp 97.9°F | Ht 66.0 in | Wt 145.0 lb

## 2023-06-20 DIAGNOSIS — F39 Unspecified mood [affective] disorder: Secondary | ICD-10-CM

## 2023-06-20 DIAGNOSIS — I5022 Chronic systolic (congestive) heart failure: Secondary | ICD-10-CM | POA: Diagnosis not present

## 2023-06-20 DIAGNOSIS — E538 Deficiency of other specified B group vitamins: Secondary | ICD-10-CM

## 2023-06-20 DIAGNOSIS — N2581 Secondary hyperparathyroidism of renal origin: Secondary | ICD-10-CM | POA: Diagnosis not present

## 2023-06-20 DIAGNOSIS — F1320 Sedative, hypnotic or anxiolytic dependence, uncomplicated: Secondary | ICD-10-CM

## 2023-06-20 DIAGNOSIS — K219 Gastro-esophageal reflux disease without esophagitis: Secondary | ICD-10-CM | POA: Diagnosis not present

## 2023-06-20 DIAGNOSIS — N1832 Chronic kidney disease, stage 3b: Secondary | ICD-10-CM

## 2023-06-20 DIAGNOSIS — Z0000 Encounter for general adult medical examination without abnormal findings: Secondary | ICD-10-CM

## 2023-06-20 DIAGNOSIS — I25119 Atherosclerotic heart disease of native coronary artery with unspecified angina pectoris: Secondary | ICD-10-CM | POA: Diagnosis not present

## 2023-06-20 DIAGNOSIS — Z23 Encounter for immunization: Secondary | ICD-10-CM

## 2023-06-20 LAB — TSH: TSH: 0.98 u[IU]/mL (ref 0.35–5.50)

## 2023-06-20 LAB — RENAL FUNCTION PANEL
Albumin: 4.2 g/dL (ref 3.5–5.2)
BUN: 16 mg/dL (ref 6–23)
CO2: 32 meq/L (ref 19–32)
Calcium: 9.5 mg/dL (ref 8.4–10.5)
Chloride: 105 meq/L (ref 96–112)
Creatinine, Ser: 1.03 mg/dL (ref 0.40–1.20)
GFR: 47.76 mL/min — ABNORMAL LOW (ref >60.00)
Glucose, Bld: 101 mg/dL — ABNORMAL HIGH (ref 70–99)
Phosphorus: 3.6 mg/dL (ref 2.3–4.6)
Potassium: 4.7 meq/L (ref 3.5–5.1)
Sodium: 143 meq/L (ref 135–145)

## 2023-06-20 LAB — CBC
HCT: 44.3 % (ref 36.0–46.0)
Hemoglobin: 14.3 g/dL (ref 12.0–15.0)
MCHC: 32.2 g/dL (ref 30.0–36.0)
MCV: 90.8 fL (ref 78.0–100.0)
Platelets: 215 10*3/uL (ref 150.0–400.0)
RBC: 4.88 Mil/uL (ref 3.87–5.11)
RDW: 14.4 % (ref 11.5–15.5)
WBC: 4.2 10*3/uL (ref 4.0–10.5)

## 2023-06-20 LAB — LIPID PANEL
Cholesterol: 225 mg/dL — ABNORMAL HIGH (ref 0–200)
HDL: 82.9 mg/dL (ref >39.00)
LDL Cholesterol: 126 mg/dL — ABNORMAL HIGH (ref 0–99)
NonHDL: 142.19
Total CHOL/HDL Ratio: 3
Triglycerides: 80 mg/dL (ref 0.0–149.0)
VLDL: 16 mg/dL (ref 0.0–40.0)

## 2023-06-20 LAB — HEPATIC FUNCTION PANEL
ALT: 8 U/L (ref 0–35)
AST: 15 U/L (ref 0–37)
Albumin: 4.2 g/dL (ref 3.5–5.2)
Alkaline Phosphatase: 64 U/L (ref 39–117)
Bilirubin, Direct: 0.1 mg/dL (ref 0.0–0.3)
Total Bilirubin: 0.7 mg/dL (ref 0.2–1.2)
Total Protein: 6.9 g/dL (ref 6.0–8.3)

## 2023-06-20 LAB — VITAMIN D 25 HYDROXY (VIT D DEFICIENCY, FRACTURES): VITD: 28.81 ng/mL — ABNORMAL LOW (ref 30.00–100.00)

## 2023-06-20 LAB — VITAMIN B12: Vitamin B-12: 336 pg/mL (ref 211–911)

## 2023-06-20 NOTE — Patient Instructions (Signed)
Please get the COVID and RSV vaccines at your pharmacy

## 2023-06-20 NOTE — Assessment & Plan Note (Signed)
Chronic dysthymia and anxiety Citalopram 20 daily and sleeps with lorazepam

## 2023-06-20 NOTE — Assessment & Plan Note (Signed)
Stable DOE Is on plavix, farxiga 10, lisinopril 5 daily Hasn't needed nitro

## 2023-06-20 NOTE — Assessment & Plan Note (Signed)
I have personally reviewed the Medicare Annual Wellness questionnaire and have noted 1. The patient's medical and social history 2. Their use of alcohol, tobacco or illicit drugs 3. Their current medications and supplements 4. The patient's functional ability including ADL's, fall risks, home safety risks and hearing or visual             impairment. 5. Diet and physical activities 6. Evidence for depression or mood disorders  The patients weight, height, BMI and visual acuity have been recorded in the chart I have made referrals, counseling and provided education to the patient based review of the above and I have provided the pt with a written personalized care plan for preventive services.  I have provided you with a copy of your personalized plan for preventive services. Please take the time to review along with your updated medication list.  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

## 2023-06-20 NOTE — Assessment & Plan Note (Signed)
PDMP reviewed No concern

## 2023-06-20 NOTE — Progress Notes (Signed)
Subjective:    Patient ID: Leslie [name], female    DOB: [birth year], 89 y.o.   MRN: [id number]  HPI Here for Medicare wellness visit and follow up of chronic health conditions Reviewed advanced directives Reviewed other doctors---Dr Cooper---cardiology, Dr Janit Pagan, Dr Lorin Picket --dentist No surgery or hospitalizations in the past year Still does a lot of outside work--raking, putting in flowers, housework. Does shopping--or son. Still drives. Lives alone Vision is poor--affects her balance. Getting referred to specialist at [facility] (diplopia, etc) Hearing aides  No alcohol or tobacco No falls Some memory /recall issues  Let did heal up okay ER visit for palpitations in August----no problems now No chest pain---just sense of "tiredness" Gets easy DOE---no progression Some edema when furosemide cut to 3 days a week--back on daily No dizziness or syncope--some balance issues Sleeps in bed with elevated HOB--no PND  Recent labs with worsening of CKD Had been 52 2 year ago--then to 43 and 38 in the ER recently Is on vitamin D--known past elevated PTH  Sleeps okay with the daily lorazepam Still gets "down" at times---can't do all she would like Church "is a mess" No daily depression Not anhedonic  Current Outpatient Medications on File Prior to Visit  Medication Sig Dispense Refill   Calcium Carbonate-Vit D-Min (CALCIUM 1200 PO) Take 1 tablet by mouth daily.     cholecalciferol (VITAMIN D3) 25 MCG (1000 UNIT) tablet Take 1,000 Units by mouth daily.     citalopram (CELEXA) 20 MG tablet TAKE 1 TABLET BY MOUTH EVERY DAY 90 tablet 3   clopidogrel (PLAVIX) 75 MG tablet TAKE 1 TABLET BY MOUTH EVERY DAY 90 tablet 3   cyanocobalamin (VITAMIN B12) 1000 MCG/ML injection INJECT 1 ML (1,000 MCG) INTRAMUSCULARLY EVERY 30 DAYS 3 mL 3   dapagliflozin propanediol (FARXIGA) 10 MG TABS tablet Take 1 tablet (10 mg total) by mouth daily before breakfast. 90 tablet 3   furosemide (LASIX) 20  MG tablet Take 1 tablet (20 mg total) by mouth daily. 90 tablet 3   levothyroxine (SYNTHROID) 75 MCG tablet TAKE 1 TABLET BY MOUTH EVERY DAY 90 tablet 3   lisinopril (ZESTRIL) 5 MG tablet Take 1 tablet (5 mg total) by mouth daily. 90 tablet 3   LORazepam (ATIVAN) 1 MG tablet TAKE 1 TABLET (1 MG TOTAL) BY MOUTH AT BEDTIME AS NEEDED FOR SLEEP. 30 tablet 0   nitroGLYCERIN (NITROSTAT) 0.4 MG SL tablet Place 1 tablet (0.4 mg total) under the tongue every 5 (five) minutes as needed for chest pain. 25 tablet 3   pantoprazole (PROTONIX) 40 MG tablet Take 1 tablet (40 mg total) by mouth daily. 30 tablet 11   Polyvinyl Alcohol-Povidone (REFRESH OP) Place 1 drop into both eyes in the morning, at noon, in the evening, and at bedtime.     SYRINGE-NEEDLE, DISP, 3 ML (BD INTEGRA SYRINGE) 25G X 1" 3 ML MISC USE 1 EVERY 30 (THIRTY) DAYS. WITH VITAMIN B12 INJECTION 3 each 3   No current facility-administered medications on file prior to visit.    Allergies  Allergen Reactions   Sulfa Antibiotics Other (See Comments)    "urine crystallizes"   Cephalexin Diarrhea   Dilaudid [Hydromorphone Hcl]         Xopenex [Levalbuterol Hcl] Other (See Comments)    "gives me the shakes"    Past Medical History:  Diagnosis Date   Anemia    Anxiety    Arthritis    Bundle branch block left  CHF (congestive heart failure) (HCC)    chronic combine CHF   Complication of anesthesia    " stomach did not wake up after back surgery "    Corneal burn    left eye-is almost healed"Cascade pod"   Depression    GERD (gastroesophageal reflux disease)    tums   Headache    hx of migraines    Heart failure with mildly reduced ejection fraction (HFmrEF) (HCC) 06/16/2014   -LVEF 30-35% improved to 50% by echo 2016  -Previously HFpEF  -Non-ischemic cardiomyopathy   -LHC 04/22/2006: EF 35-45, no CAD  -TTE 06/17/2019: EF 30-35, moderate LVH, normal RVSF, moderate LAE, mild MR, mild TR, moderate AI, AV sclerosis, mild PI, mildly  elevated PASP, RVSP 30.2  -TTE 12/20/2022: Severe abnormal paradoxical septal motion consistent with LBBB, EF 45-50, global HK, mild LVH, GR 1 DD,   Heart failure, left-sided (HCC)    Hypertension    Hypothyroidism    MI (myocardial infarction) (HCC)    Nonischemic cardiomyopathy (HCC)    '01 EF 30% with normal coronaries; EF 45-50% 03/2012   Shortness of breath    hx of nothing current on 01/17/2015    Stroke [facility])    blurred vision residual    Past Surgical History:  Procedure Laterality Date   CARDIAC CATHETERIZATION     13 yrs. ago   CATARACT EXTRACTION, BILATERAL Bilateral    detached retinia     DILATION AND CURETTAGE OF UTERUS     EYE SURGERY     bilateral cataracts   HERNIA REPAIR     hiatial   hiatal hernia surgery      LUMBAR LAMINECTOMY/DECOMPRESSION MICRODISCECTOMY  05/17/2012   Procedure: LUMBAR LAMINECTOMY/DECOMPRESSION MICRODISCECTOMY 2 LEVELS;  Surgeon: Cristi Loron, MD;  Location: [facility];  Service: Neurosurgery;  Laterality: N/A;  Lumbar four laminectomy with bilateral Lumbar three laminotomies and removal of synovial cyst   TONSILLECTOMY     TOTAL KNEE ARTHROPLASTY Left 01/21/2015   Procedure: LEFT TOTAL KNEE ARTHROPLASTY;  Surgeon: Durene Romans, MD;  Location: [facility];  Service: Orthopedics;  Laterality: Left;   TOTAL KNEE ARTHROPLASTY Right 07/22/2015   Procedure: RIGHT TOTAL KNEE ARTHROPLASTY;  Surgeon: Durene Romans, MD;  Location: [facility]S;  Service: Orthopedics;  Laterality: Right;    Family History  Problem Relation Age of Onset   CAD Mother    Dementia Father    Hypertension Sister    Diabetes Sister    Cancer Sister     Social History   Socioeconomic History   Marital status: Widowed    Spouse name: Not on file   Number of children: 1   Years of education: Not on file   Highest education level: Not on file  Occupational History   Occupation: Neurosurgeon    Comment: retired  Tobacco Use   Smoking status: Never    Passive exposure:  Never   Smokeless tobacco: Never  Vaping Use   Vaping status: Never Used  Substance and Sexual Activity   Alcohol use: No   Drug use: No   Sexual activity: Never  Other Topics Concern   Not on file  Social History Narrative   Widowed 2015   1 adopted son      Has living will   Son and nephew Moishe Spice are health care POAs   Requests DNR---written 06/16/22   No machine or tube feeds if cognitively unaware   Social Determinants of Health   Financial Resource Strain:  Low Risk  (01/26/2022)   Overall Financial Resource Strain (CARDIA)    Difficulty of Paying Living Expenses: Not very hard  Food Insecurity: No Food Insecurity (02/14/2023)   Hunger Vital Sign    Worried About Running Out of Food in the Last Year: Never true    Ran Out of Food in the Last Year: Never true  Transportation Needs: No Transportation Needs (02/14/2023)   PRAPARE - Administrator, Civil Service (Medical): No    Lack of Transportation (Non-Medical): No  Physical Activity: Not on file  Stress: Not on file  Social Connections: Not on file  Intimate Partner Violence: Not At Risk (02/14/2023)   Humiliation, Afraid, Rape, and Kick questionnaire    Fear of Current or Ex-Partner: No    Emotionally Abused: No    Physically Abused: No    Sexually Abused: No   Review of Systems Appetite is down some--taste seems gone Weight is stable Wears seat belt Teeth okay---overdue for dentist No suspicious skin lesions---fleshy mole on left cheek she doesn't like (not cancer) Rare heartburn on pantprazole. No sig dysphagia Bowels move okay---with prunes Some back "tiredness"---uses cream and it helps    Objective:   Physical Exam Constitutional:      Appearance: Normal appearance.  HENT:     Mouth/Throat:     Pharynx: No oropharyngeal exudate or posterior oropharyngeal erythema.  Eyes:     Conjunctiva/sclera: Conjunctivae normal.     Pupils: Pupils are equal, round, and reactive to light.   Cardiovascular:     Rate and Rhythm: Normal rate and regular rhythm.     Pulses: Normal pulses.     Heart sounds: No murmur heard.    No gallop.  Pulmonary:     Effort: Pulmonary effort is normal.     Breath sounds: Normal breath sounds. No wheezing or rales.  Abdominal:     Palpations: Abdomen is soft.     Tenderness: There is no abdominal tenderness.  Musculoskeletal:     Cervical back: Neck supple.     Right lower leg: No edema.     Comments: Trace left ankle edema  Lymphadenopathy:     Cervical: No cervical adenopathy.  Skin:    Comments: Non specific pale pink papules on left low back --no inflammation or vesciles  Neurological:     General: No focal deficit present.     Mental Status: She is alert and oriented to person, place, and time.     Comments: Word naming-- 13/1 minute Recall 3/3  Psychiatric:        Mood and Affect: Mood normal.        Behavior: Behavior normal.            Assessment & Plan:

## 2023-06-20 NOTE — Assessment & Plan Note (Signed)
Compensated but didn't do well weaning the furosemide--still 20 daily

## 2023-06-20 NOTE — Assessment & Plan Note (Signed)
Okay with protonix 40 daily

## 2023-06-20 NOTE — Progress Notes (Signed)
Hearing Screening - Comments:: Has hearing aids. Not wearing them today Vision Screening - Comments:: May 2024

## 2023-06-20 NOTE — Assessment & Plan Note (Signed)
Will recheck labs On lisinopril

## 2023-06-20 NOTE — Assessment & Plan Note (Signed)
Is on the vitamin D

## 2023-06-21 LAB — PARATHYROID HORMONE, INTACT (NO CA): PTH: 106 pg/mL — ABNORMAL HIGH (ref 16–77)

## 2023-06-27 ENCOUNTER — Other Ambulatory Visit: Payer: Self-pay | Admitting: Internal Medicine

## 2023-07-22 ENCOUNTER — Telehealth: Payer: Self-pay | Admitting: Internal Medicine

## 2023-07-22 MED ORDER — "BD INTEGRA SYRINGE 25G X 1"" 3 ML MISC": 3 refills | Status: DC

## 2023-07-22 NOTE — Telephone Encounter (Signed)
Rx sent electronically.  

## 2023-07-22 NOTE — Telephone Encounter (Signed)
Prescription Request  07/22/2023  LOV: 06/20/2023  What is the name of the medication or equipment? SYRINGE-NEEDLE, DISP, 3 ML (BD INTEGRA SYRINGE) 25G X 1" 3 ML MISC   Have you contacted your pharmacy to request a refill? No   Which pharmacy would you like this sent to?  [id number], [place] - [address] [place] [address] Phone: [phone] Fax: [phone]    Patient notified that their request is being sent to the clinical staff for review and that they should receive a response within 2 business days.   Please advise at Mobile [phone] (mobile)

## 2023-07-23 ENCOUNTER — Other Ambulatory Visit: Payer: Self-pay | Admitting: Internal Medicine

## 2023-07-28 ENCOUNTER — Other Ambulatory Visit: Payer: Self-pay | Admitting: Internal Medicine

## 2023-07-28 NOTE — Telephone Encounter (Signed)
Last filled 06-27-23 #30 Last OV 06-20-23 Next OV 12-20-23 [facility]

## 2023-08-09 IMAGING — DX DG CHEST 1V PORT
1 series · 1 of 1 positions shown · non-contrast
Comparison: Radiographs 05/10/2012 and 06/20/2008.  CT 02/26/2015.

CLINICAL DATA: Left-sided chest pain radiating into the back.
Congestive heart failure.

EXAM:
PORTABLE CHEST 1 VIEW

[chest ap]
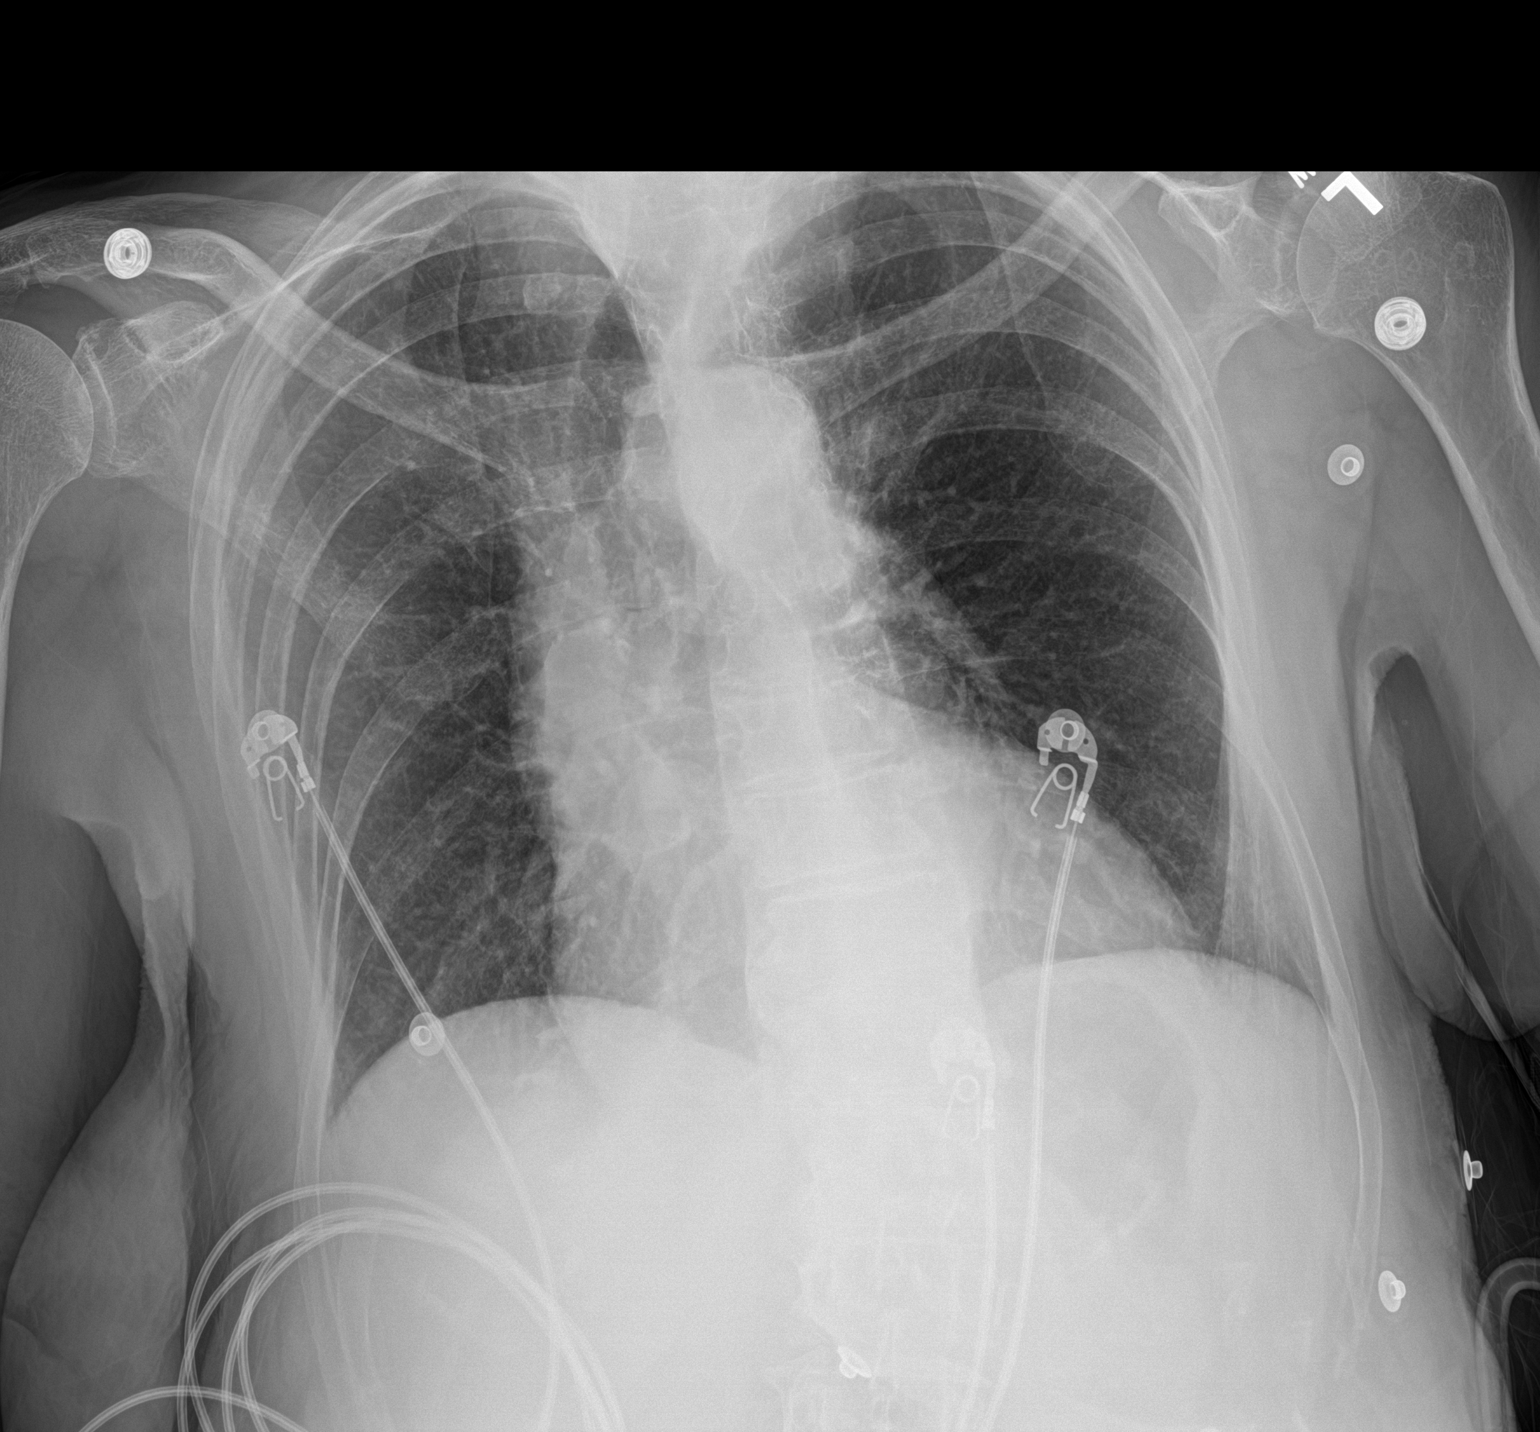

[1 of 1 positions shown; findings below may reference images not displayed]

FINDINGS: 7673 hours. There is mild patient rotation to the right. The heart
size is stable. There is increased soft tissue fullness of the right
hilum which may be related to the rotation. A right hilar mass
cannot be excluded. The lungs otherwise appear clear. There is no
pleural effusion or pneumothorax. The bones appear unremarkable.
Telemetry leads overlie the chest.
IMPRESSION: 1. Possible right hilar mass. This is suboptimally evaluated due to
patient rotation. Recommend further evaluation with PA and lateral
chest radiographs or CT.
2. No acute findings identified.

## 2023-08-09 IMAGING — CT CT CHEST W/O CM
2 of 4 series · 15 of 36 positions shown, 18 images · non-contrast
Comparison: 02/26/2015

Correlation: Chest radiograph 04/15/2021

CLINICAL DATA: Chest pain, shortness of breath, pleurisy or
effusion suspected. Abnormal chest radiograph with question RIGHT
hilar mass history of CHF, hypertension, MI, non ischemic
cardiomyopathy

EXAM:
CT CHEST WITHOUT CONTRAST
TECHNIQUE: Multidetector CT imaging of the chest was performed following the
standard protocol without IV contrast. Sagittal and coronal MPR
images reconstructed from axial data set.

[Series 4: thorax 2.0 · axial · 0.66mm/px · z∈[-379,-105]mm · 12 of 155 slices shown, 15 images]
[im 9/155  mediastinal]
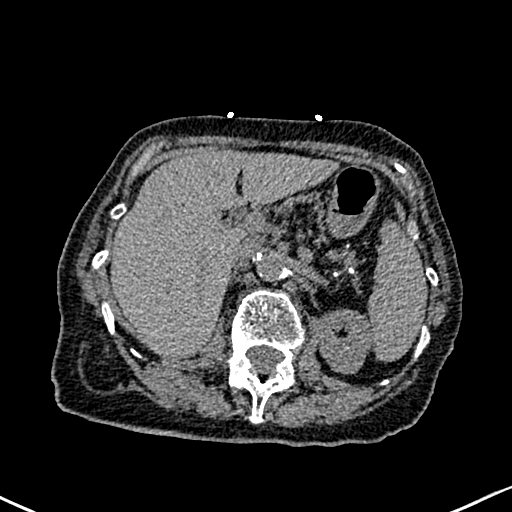
[im 9/155  lung]
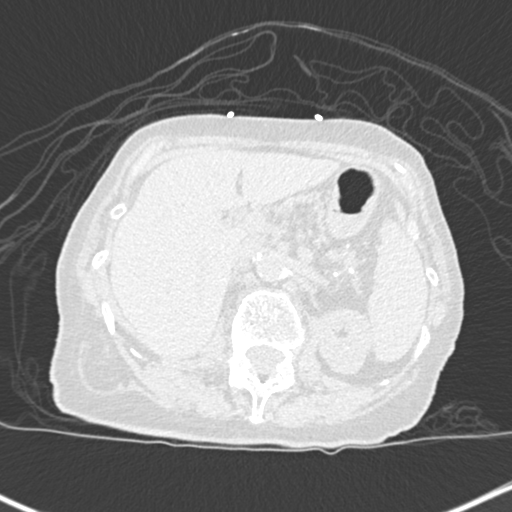
[im 25/155  lung]
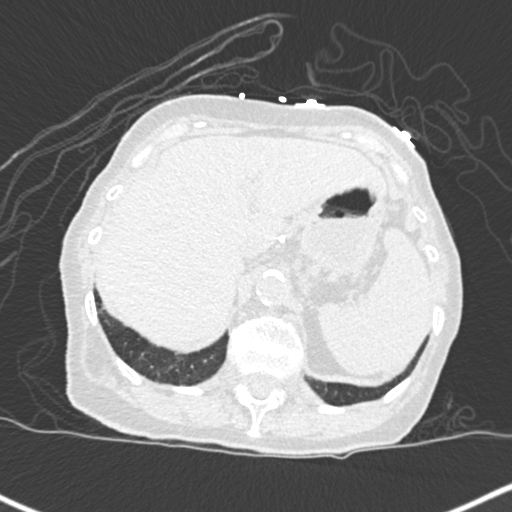
[im 33/155  lung]
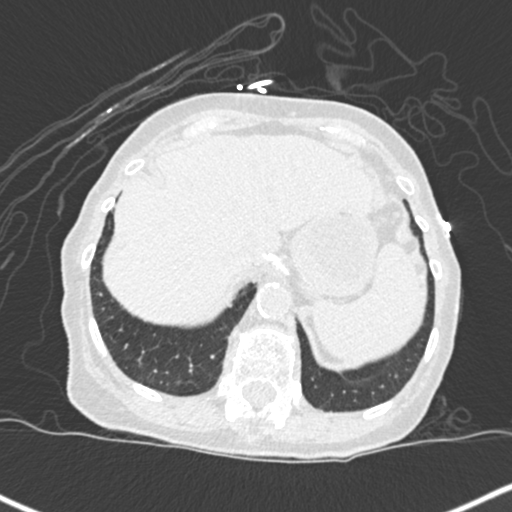
[im 49/155  lung]
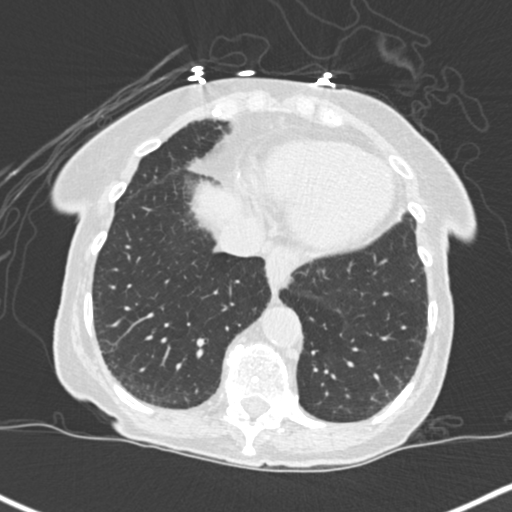
[im 57/155  mediastinal]
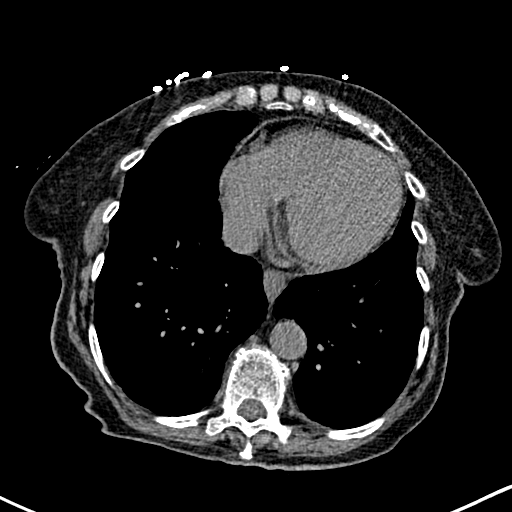
[im 57/155  lung]
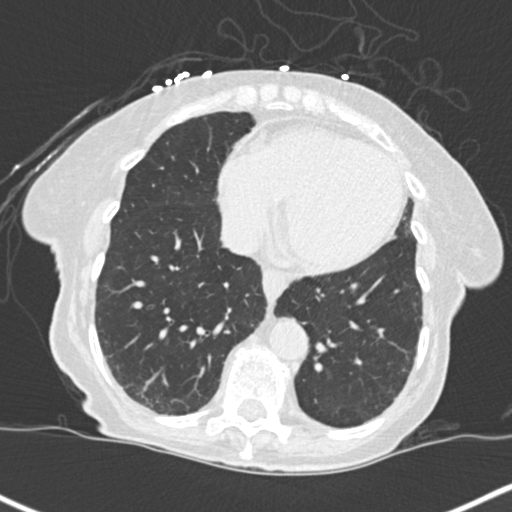
[im 73/155  lung]
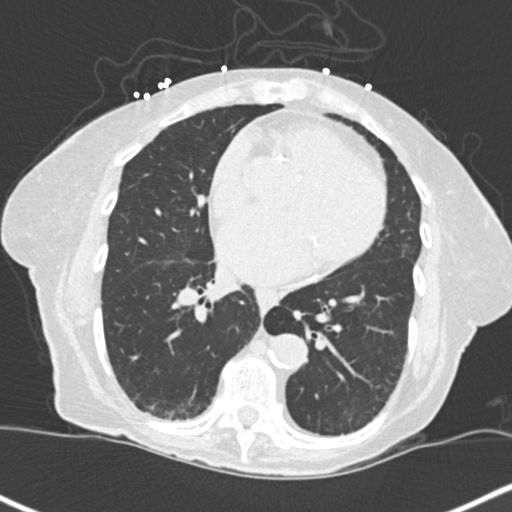
[im 82/155  lung]
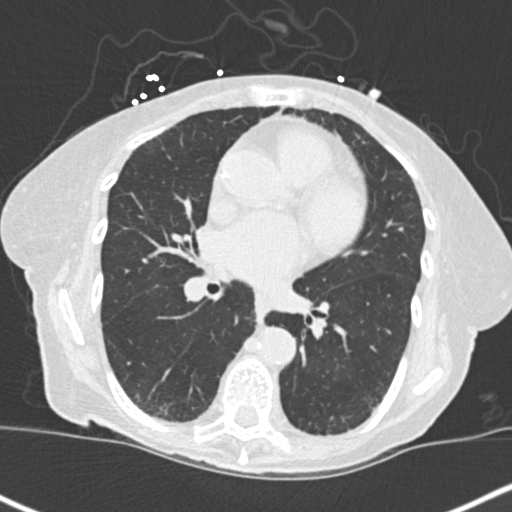
[im 98/155  lung]
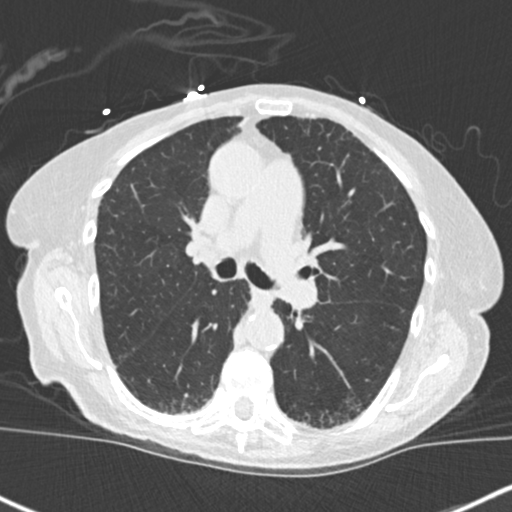
[im 106/155  mediastinal]
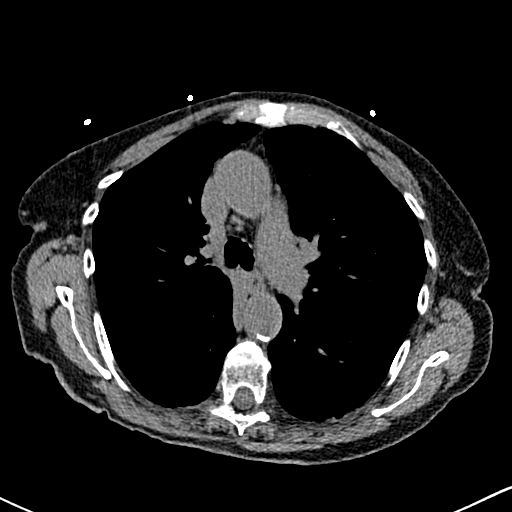
[im 106/155  lung]
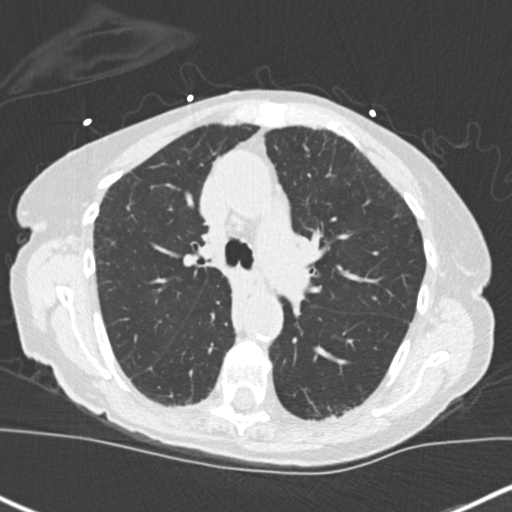
[im 122/155  lung]
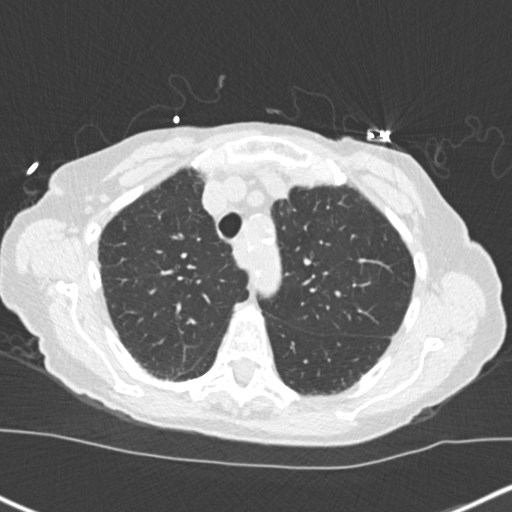
[im 130/155  lung]
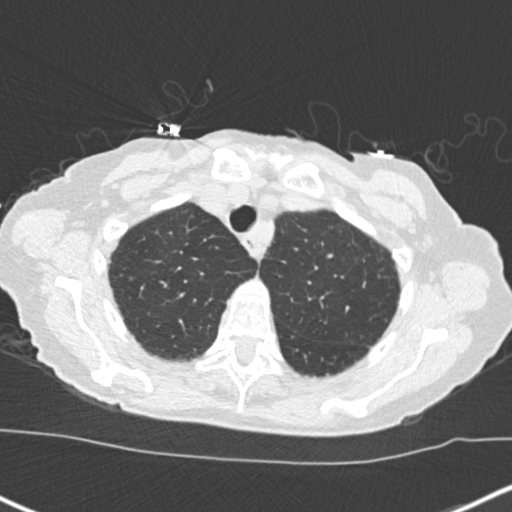
[im 146/155  lung]
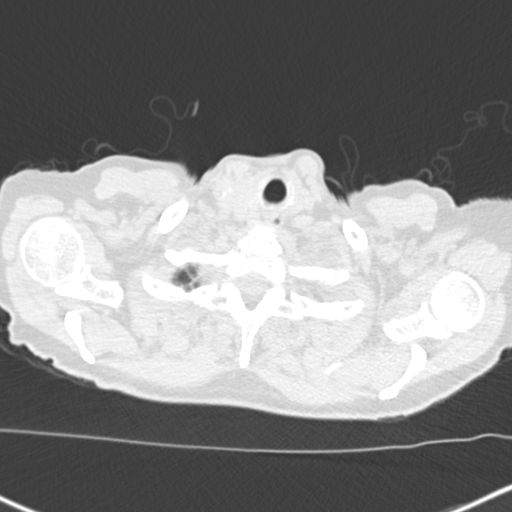

[Series 7: coronal · coronal · 0.62mm/px · 3 of 87 slices shown]
[im 18/87  lung]
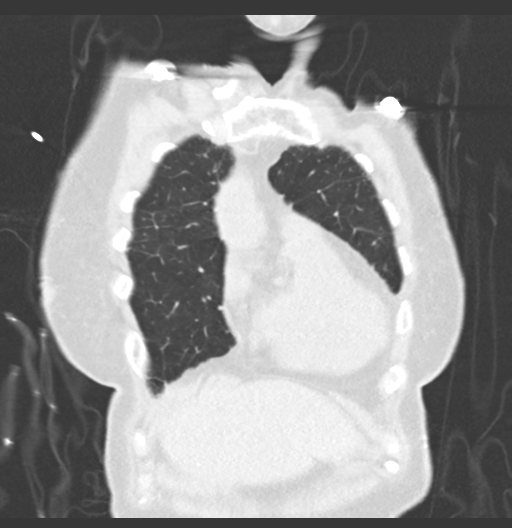
[im 35/87  lung]
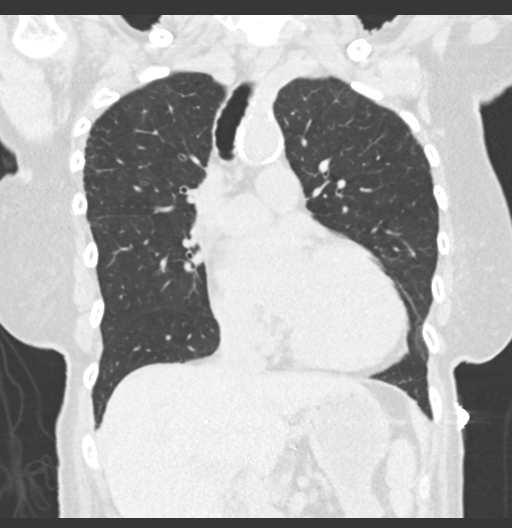
[im 52/87  lung]
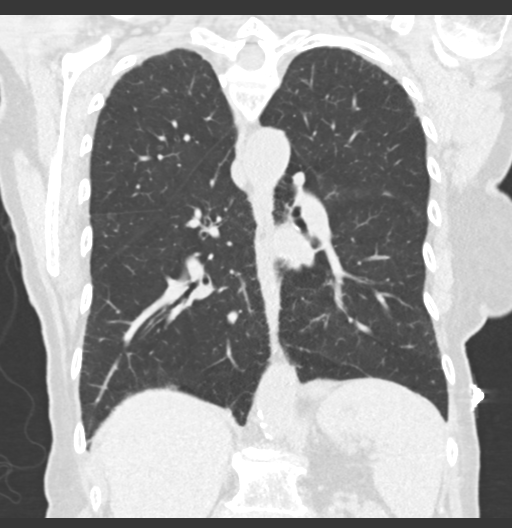

[15 of 36 positions shown; findings below may reference images not displayed]

FINDINGS: Cardiovascular: Atherosclerotic calcifications aorta and coronary
arteries. And normal caliber. Heart unremarkable. No pericardial
effusion.

Mediastinum/Nodes: Surgical clips at GE junction. Esophagus
unremarkable. Few normal size mediastinal lymph nodes. No
mediastinal or axillary adenopathy. Assessment of pulmonary hila is
suboptimal due to lack of IV contrast, with no gross adenopathy
delineated. Hilar configuration appears similar to prior exam.

Lungs/Pleura: Scattered chronic accentuation of subpleural
interstitial markings, stable. Irregular questionable nodular
opacity versus scar 7 mm diameter image 20 unchanged. Thickening at
RIGHT major fissure unchanged. No acute infiltrate, pleural
effusion, or pneumothorax.

Upper Abdomen: Unremarkable

Musculoskeletal: Mild degenerative disc disease changes of lower
thoracic spine.
IMPRESSION: Assessment of hila is suboptimal due to lack of IV contrast but no
gross hilar adenopathy or change in hilar morphology is identified.

Chronic accentuation of subpleural interstitial markings and small
irregular RIGHT apical opacity likely scar unchanged.

No acute intrathoracic abnormalities.

Coronary arterial calcifications.

Aortic Atherosclerosis ([id number]-EOB.B).

## 2023-08-21 ENCOUNTER — Other Ambulatory Visit: Payer: Self-pay | Admitting: Internal Medicine

## 2023-08-29 ENCOUNTER — Other Ambulatory Visit: Payer: Self-pay | Admitting: Internal Medicine

## 2023-09-02 ENCOUNTER — Other Ambulatory Visit: Payer: Self-pay | Admitting: Internal Medicine

## 2023-09-26 NOTE — Progress Notes (Deleted)
{  This patient may be at risk for Amyloid. She has one or more dx on the problem list or PMH from the following list - Abnormal EKG, CHF, Aortic Stenosis, Proteinuria, LVH, Carpal Tunnel Syndrome, Biceps Tendon Rupture, Syncope. See list below or review PMH.  Diagnoses From Problem List           Noted     Heart failure with mildly reduced ejection fraction (HFmrEF) (HCC) 06/16/2014    Click HERE to open Cardiac Amyloid Screening SmartSet to order screening OR Click HERE to defer testing for 1 year or permanently :1}    Cardiology Office Note:    Date:  09/26/2023  ID:  Leslie [name], DOB [birth year], MRN [id number] PCP: Leslie [name], Leslie [name]  [facility] Providers Cardiologist:  Leslie [name], Leslie [name] { Click to update primary Leslie [name],subspecialty Leslie [name] or APP then REFRESH:1}    {Click to Open Review  :1}   Patient Profile:     *** HFmrEF (heart failure with mildly reduced ejection fraction)  Previously HFpEF Non-ischemic cardiomyopathy  LHC 04/22/2006: EF 35-45, no CAD TTE 06/17/2019: EF 30-35, moderate LVH, normal RVSF, moderate LAE, mild MR, mild TR, moderate AI, AV sclerosis, mild PI, mildly elevated PASP, RVSP 30.2 TTE 12/20/2022: Severe abnormal paradoxical septal motion consistent with LBBB, EF 45-50, global HK, mild LVH, GR 1 DD, normal RVSF, normal PASP, RVSP 25.3, mild LAE, mild-moderate MR, moderate AI, AV sclerosis, RAP 3 Coronary artery Ca2+ Mitral Regurgitation TTE 12/2022: mild to mod Aortic Insufficiency  TTE 12/2022: mod Left Bundle Branch Block Bradycardia  Hx of CVA Carotid US 12/06/2022: Bilateral ICA 1-39; right subclavian flow disturbed; beadlike appearance CTA-?  FMD, prominent innominate artery >> Rpt 1 year Hypertension  Hyperlipidemia  Hypothyroidism      {      :1}    Leslie [name] is a 89 y.o. female who returns for follow up of CHF. She was last seen by Dr. Excell Seltzer in 05/2023. Her furosemide was resumed b/c of volume excess.  *** ROS-See HPI***    Studies  Reviewed:       *** Results    Risk Assessment/Calculations:   {Does this patient have ATRIAL FIBRILLATION?[phone]} No BP recorded.  {Refresh Note OR Click here to enter BP  :1}***       Physical Exam:   VS:  There were no vitals taken for this visit.   Wt Readings from Last 3 Encounters:  06/20/23 145 lb (65.8 kg)  05/26/23 146 lb (66.2 kg)  04/08/23 143 lb (64.9 kg)    Physical Exam***     Assessment and Plan:   Assessment & Plan Heart failure with mildly reduced ejection fraction (HFmrEF) (HCC)  Primary hypertension  Nonrheumatic aortic valve insufficiency  Bilateral carotid artery stenosis  Assessment and Plan Assessment & Plan    {      :1}    {Are you ordering a CV Procedure (e.g. stress test, cath, DCCV, TEE, etc)?   Press F2        :[id number]}  Dispo:  No follow-ups on file.  Signed, Tereso Newcomer, PA-C

## 2023-09-27 ENCOUNTER — Other Ambulatory Visit: Payer: Self-pay | Admitting: Internal Medicine

## 2023-09-27 NOTE — Telephone Encounter (Signed)
Last filled 08-29-23 #30 Last OV 06-20-23 Next OV 12-20-23 [facility]

## 2023-09-28 ENCOUNTER — Ambulatory Visit: Payer: Medicare Other | Admitting: Physician Assistant

## 2023-10-15 DIAGNOSIS — S61552A Open bite of left wrist, initial encounter: Secondary | ICD-10-CM | POA: Diagnosis not present

## 2023-10-15 DIAGNOSIS — W5501XA Bitten by cat, initial encounter: Secondary | ICD-10-CM | POA: Diagnosis not present

## 2023-10-15 DIAGNOSIS — S61432A Puncture wound without foreign body of left hand, initial encounter: Secondary | ICD-10-CM | POA: Diagnosis not present

## 2023-10-17 ENCOUNTER — Telehealth: Payer: Self-pay

## 2023-10-17 ENCOUNTER — Ambulatory Visit: Payer: Self-pay | Admitting: Internal Medicine

## 2023-10-17 NOTE — Telephone Encounter (Signed)
 Marland Kitchen

## 2023-10-17 NOTE — Telephone Encounter (Signed)
 I am glad she got the care she needed

## 2023-10-17 NOTE — Telephone Encounter (Signed)
 Chief Complaint: Diarrhea Symptoms: Diarrhea x2 while on antibiotic, stomach pain Frequency: Two times Pertinent Negatives: Patient denies tongue/lip swelling, hives, nausea, vomiting Disposition: [] ED /[] Urgent Care (no appt availability in office) / [] Appointment(In office/virtual)/ []  [place] [facility]/ [x] Home Care/ [] Refused Recommended Disposition /[] [place] [facility]/ []  Follow-up with PCP Additional Notes: Pt states she had a cat bite her on 2/8. Pt was prescribed an antibiotic, Augmentin. Pt thinks she is allergic to the antibiotic as she has had diarrhea x2 today. Pt states she was bit yesterday by the cat again. Pt wants to know if she should continue taking the antibiotic. Pt told the office will reach out tomorrow. Pt denies any other symptoms and states she is staying well hydrated. This RN educated pt on home care, new-worsening symptoms, when to call back/seek emergent care. Pt verbalized understanding and agrees to plan.     Copied from CRM [id number]. Topic: Clinical - Red Word Triage >> Oct 17, 2023  5:24 PM Sonny Dandy B wrote: Red Word that prompted transfer to Nurse Triage: pt called stating she is having an allergic reaction to antibiotics. Requested to speak with a nurse Reason for Disposition  [1] MILD diarrhea AND [2] taking antibiotics  Answer Assessment - Initial Assessment Questions 1. ANTIBIOTIC: "What antibiotic are you taking?" "How many times per day?"     Augmentin 2. ANTIBIOTIC ONSET: "When was the antibiotic started?"     Friday night 3. DIARRHEA SEVERITY: "How bad is the diarrhea?" "How many more stools have you had in the past 24 hours than normal?"    - NO DIARRHEA (SCALE 0)   - MILD (SCALE 1-3): Few loose or mushy BMs; increase of 1-3 stools over normal daily number of stools; mild increase in ostomy output.   -  MODERATE (SCALE 4-7): Increase of 4-6 stools daily over normal; moderate increase in ostomy output. * SEVERE (SCALE 8-10; OR 'WORST  POSSIBLE'): Increase of 7 or more stools daily over normal; moderate increase in ostomy output; incontinence.     Mild 4. ONSET: "When did the diarrhea begin?"      This morning 5. ABDOMEN PAIN: "Are you having any abdomen pain?" If Yes, ask: "What does it feel like?" (e.g., crampy, dull, intermittent, constant)      Stomach hurts a little bit 6. ORAL INTAKE: If vomiting, "Have you been able to drink liquids?" "How much liquids have you had in the past 24 hours?"     Yes drinking fluids 10. HYDRATION: "Any signs of dehydration?" (e.g., dry mouth [not just dry lips], too weak to stand, dizziness, new weight loss) "When did you last urinate?"       Denies 12. OTHER SYMPTOMS: "Do you have any other symptoms?" (e.g., fever, blood in stool)       Denies  Protocols used: Diarrhea on Antibiotics-A-AH

## 2023-10-17 NOTE — Telephone Encounter (Signed)
 I spoke with pt; pt went to [facility] and pt was given abx and pt is not having any problem now. Pt was scratched on other hand by cat  on 10/16/23; pt bandaged rt hand and no signs of bleeding or drainage pt declined appt and will cb if appt needed. UC & ED precautions given and pt voiced understanding. Pt said was upto date on tetanus. Sending note to Dr Alphonsus Sias.

## 2023-10-18 NOTE — Telephone Encounter (Signed)
 Called and spoke to pt. The triage message was incorrect about it being 2-8. It was 3-8. Advised her diarrhea is a common side effect of Augmentin. Asked if she was eating and then taking it. She stated the directions said to take 1 in the morning and 1 at night. So, she was not eating with the nighttime dose. I advised her to eat this morning and take it and then take the 2nd dose with dinner tonight. I will call her tomorrow to follow-up.

## 2023-10-19 NOTE — Telephone Encounter (Addendum)
 Called and spoke to pt. She said she still had some diarrhea yesterday even after eating before taking the medication. She will make sure to eat before taking it today. She did say her hand was not hurting today. I will call later this afternoon or tomorrow to check on her, again.

## 2023-10-24 ENCOUNTER — Other Ambulatory Visit: Payer: Self-pay | Admitting: Internal Medicine

## 2023-10-27 ENCOUNTER — Other Ambulatory Visit: Payer: Self-pay | Admitting: Internal Medicine

## 2023-10-27 NOTE — Telephone Encounter (Signed)
 Last filled 09-28-23 #30 Last OV 06-20-23 Next OV 12-20-23 [facility]

## 2023-11-13 NOTE — Progress Notes (Signed)
 "    Cardiology Office Note:    Date:  11/14/2023  ID:  Leslie [name], DOB 03-17-1931, MRN [id number] PCP: Jimmy Charlie FERNS, MD   HeartCare Providers Cardiologist:  Ozell Fell, MD       Patient Profile:      HFmrEF (heart failure with mildly reduced ejection fraction)  Previously HFpEF Non-ischemic cardiomyopathy  LHC 04/22/2006: EF 35-45, no CAD TTE 06/17/2019: EF 30-35, moderate LVH, normal RVSF, moderate LAE, mild MR, mild TR, moderate AI, AV sclerosis, mild PI, mildly elevated PASP, RVSP 30.2 TTE 12/20/2022: Severe abnormal paradoxical septal motion consistent with LBBB, EF 45-50, global HK, mild LVH, GR 1 DD, normal RVSF, normal PASP, RVSP 25.3, mild LAE, mild-moderate MR, moderate AI, AV sclerosis, RAP 3 Coronary artery Ca2+ Mitral Regurgitation TTE 12/2022: mild to mod Aortic Insufficiency  TTE 12/2022: mod Left Bundle Branch Block Bradycardia  Hx of CVA Carotid US  12/06/2022: Bilateral ICA 1-39; right subclavian flow disturbed; beadlike appearance CTA-?  FMD, prominent innominate artery >> Rpt 1 year Hypertension  Hyperlipidemia  Hypothyroidism          Discussed the use of AI scribe software for clinical note transcription with the patient, who gave verbal consent to proceed.  History of Present Illness Leslie [name] is a 89 y.o. female who returns for follow up of CHF. She was last seen by Dr. Fell in 05/2023.   She is here with her friend. Over the last 1.5 years, she has experienced a significant loss of energy, with worsening fatigue since December. She describes feeling 'energyless' and attributes some of this to not recovering from the 'Christmas blues.' She experiences shortness of breath with minimal exertion, such as walking a few steps or across a room. She can sleep on her back with one pillow and a slightly raised bed but does not require further elevation to breathe comfortably. No nocturnal dyspnea or orthopnea. No chest discomfort, heaviness,  tightness, or pressure, and no syncope. She notes swelling in her legs when she is active at home but not otherwise. Her weight has been stable, with a recent measurement of 142 pounds, down from 146 pounds at a previous visit. She reports a loss of taste and decreased appetite, eating three meals a day but not in large quantities. She sometimes feels depressed and is currently taking citalopram  and lorazepam  for mood, which she has been on for three years. No recent fever, chills, or productive cough, though she mentions a slight cough due to dust. No gastrointestinal bleeding or urinary symptoms.   ROS-See HPI    Studies Reviewed:       Results Labs - chart review 06/20/23: K 4.7, SCr 1.03, ALT 8, TC 225, HDL 82.9, LDL 126, Tg 80, Hgb 14.3, TSH 0.98    Risk Assessment/Calculations:     HYPERTENSION CONTROL Vitals:   11/14/23 1550 11/14/23 1703  BP: (!) 160/69 (!) 180/70    The patient's blood pressure is elevated above target today.  In order to address the patient's elevated BP: A current anti-hypertensive medication was adjusted today.          Physical Exam:   VS:  BP (!) 180/70   Pulse 64   Ht 5' 6 (1.676 m)   Wt 142 lb 6.4 oz (64.6 kg)   SpO2 98%   BMI 22.98 kg/m    Wt Readings from Last 3 Encounters:  11/14/23 142 lb 6.4 oz (64.6 kg)  06/20/23 145 lb (65.8 kg)  05/26/23  146 lb (66.2 kg)    Constitutional:      Appearance: Healthy appearance. Not in distress.  Neck:     Vascular: JVD normal.  Pulmonary:     Breath sounds: Normal breath sounds. No wheezing. No rales.  Cardiovascular:     Normal rate. Regular rhythm.     Murmurs: There is a grade 2/6 systolic murmur at the LLSB and ULSB.  Edema:    Peripheral edema absent.  Abdominal:     Palpations: Abdomen is soft.        Assessment and Plan:   Assessment & Plan Heart failure with mildly reduced ejection fraction EF 45-50% by echocardiogram in May 2024.  She reports fatigue and dyspnea on exertion.  No significant weight gain, chest discomfort, or syncope. Currently on Farxiga  10 mg daily and Lasix  20 mg daily.  He was intolerant to beta-blocker in the past.  We have not used spironolactone  in the past secondary to low blood pressure.  Her blood pressure today is significantly elevated.  She does note better blood pressures at home.  - Continue Farxiga  10 mg daily - Continue Lasix  20 mg daily - Increase Lisinopril  to 10 mg once daily for better BP control - Order BMP, BNP, and CBC in two weeks - Arrange follow-up echocardiogram - Consider Spironolactone  if BP remains high  Valvular heart disease Moderate mitral regurgitation and moderate aortic insufficiency on echocardiogram in May 2024.   - Arrange follow-up echocardiogram  Hypertension Blood pressure uncontrolled.  Readings at home are usually better.  However, she does note several systolic readings in the 140s.  Goal is <130/80.  She does note drinking significant amounts of caffeine at home (tea, [facility]). - Increase lisinopril  to 10 mg daily - Monitor at home and send readings in 1 week - Reduce/avoid caffeine - BMET 1 week  Carotid artery disease Carotid artery disease with possible fibromuscular dysplasia noted on carotid ultrasound in April 2024.  - Follow-up carotid ultrasound pending this month  Fatigue Significant fatigue since December, worsening since Christmas.  I suspect her fatigue is multifactorial.  It is at least in part related to depression.  She does note difficulty sleeping.  She does drink a lot of caffeine as noted above with tea and [facility].  She also has a poor appetite. - Advised patient to consider drinking Ensure or Boost daily for additional calories and protein - Avoid caffeine to improve sleep hygiene - Obtain BMET, CBC, BNP  - Obtain follow up echocardiogram. - Follow up with primary care physician regarding management for depression       Dispo:  Return in about 6 months (around  05/15/2024) for Routine Follow Up, w/ Dr. Wonda, or Glendia Ferrier, PA-C.  Signed, Glendia Ferrier, PA-C   "

## 2023-11-14 ENCOUNTER — Ambulatory Visit: Payer: Medicare Other | Attending: Physician Assistant | Admitting: Physician Assistant

## 2023-11-14 ENCOUNTER — Encounter: Payer: Self-pay | Admitting: Physician Assistant

## 2023-11-14 ENCOUNTER — Other Ambulatory Visit: Payer: Self-pay | Admitting: *Deleted

## 2023-11-14 VITALS — BP 180/70 | HR 64 | Ht 66.0 in | Wt 142.4 lb

## 2023-11-14 DIAGNOSIS — I5022 Chronic systolic (congestive) heart failure: Secondary | ICD-10-CM | POA: Insufficient documentation

## 2023-11-14 DIAGNOSIS — I351 Nonrheumatic aortic (valve) insufficiency: Secondary | ICD-10-CM

## 2023-11-14 DIAGNOSIS — I340 Nonrheumatic mitral (valve) insufficiency: Secondary | ICD-10-CM | POA: Diagnosis not present

## 2023-11-14 DIAGNOSIS — R5383 Other fatigue: Secondary | ICD-10-CM | POA: Diagnosis present

## 2023-11-14 DIAGNOSIS — I10 Essential (primary) hypertension: Secondary | ICD-10-CM | POA: Insufficient documentation

## 2023-11-14 DIAGNOSIS — I6523 Occlusion and stenosis of bilateral carotid arteries: Secondary | ICD-10-CM | POA: Diagnosis present

## 2023-11-14 MED ORDER — LISINOPRIL 10 MG PO TABS: 10.0000 mg | ORAL_TABLET | Freq: Every day | ORAL | 3 refills | Status: DC

## 2023-11-14 NOTE — Patient Instructions (Addendum)
 Medication Instructions:  Your physician has recommended you make the following change in your medication:   INCREASE the Lisinopril to 10 mg taking 1 daily  *If you need a refill on your cardiac medications before your next appointment, please call your pharmacy*  Lab Work: IN 2 WEEKS, GO TO YOUR PCP'S OFFICE FOR:  BMET, CBC, & MAG  If you have labs (blood work) drawn today and your tests are completely normal, you will receive your results only by: MyChart Message (if you have MyChart) OR A paper copy in the mail If you have any lab test that is abnormal or we need to change your treatment, we will call you to review the results.  Testing/Procedures: Your physician has requested that you have an echocardiogram IN MAY 2025. Echocardiography is a painless test that uses sound waves to create images of your heart. It provides your doctor with information about the size and shape of your heart and how well your heart's chambers and valves are working. This procedure takes approximately one hour. There are no restrictions for this procedure. Please do NOT wear cologne, perfume, aftershave, or lotions (deodorant is allowed). Please arrive 15 minutes prior to your appointment time.  Please note: We ask at that you not bring children with you during ultrasound (echo/ vascular) testing. Due to room size and safety concerns, children are not allowed in the ultrasound rooms during exams. Our front office staff cannot provide observation of children in our lobby area while testing is being conducted. An adult accompanying a patient to their appointment will only be allowed in the ultrasound room at the discretion of the ultrasound technician under special circumstances. We apologize for any inconvenience.   Your physician has requested that you have a carotid duplex IN MAY 2025 (PREVIOUSLY ORDERED) This test is an ultrasound of the carotid arteries in your neck. It looks at blood flow through these  arteries that supply the brain with blood. Allow one hour for this exam. There are no restrictions or special instructions.   Follow-Up: At [facility], you and your health needs are our priority.  As part of our continuing mission to provide you with exceptional heart care, our providers are all part of one team.  This team includes your primary Cardiologist (physician) and Advanced Practice Providers or APPs (Physician Assistants and Nurse Practitioners) who all work together to provide you with the care you need, when you need it.  Your next appointment:   6 month(s)  Provider:   Tonny Bollman, MD     We recommend signing up for the patient portal called "MyChart".  Sign up information is provided on this After Visit Summary.  MyChart is used to connect with patients for Virtual Visits (Telemedicine).  Patients are able to view lab/test results, encounter notes, upcoming appointments, etc.  Non-urgent messages can be sent to your provider as well.   To learn more about what you can do with MyChart, go to [email or web address].   Other Instructions Your physician has requested that you regularly monitor and record your blood pressure readings at home. Please use the same machine at the same time of day to check your readings and record them to bring to your follow-up visit.   Please monitor blood pressures and keep a log of your readings for 1 week then call them into the office.    Make sure to check 2 hours after your medications.    AVOID these things for 30 minutes before checking  your blood pressure: No Drinking caffeine. No Drinking alcohol. No Eating. No Smoking. No Exercising.   Five minutes before checking your blood pressure: Pee. Sit in a dining chair. Avoid sitting in a soft couch or armchair. Be quiet. Do not talk       1st Floor: - Lobby - Registration  - Pharmacy  - Lab - Cafe  2nd Floor: - PV Lab - Diagnostic Testing (echo, CT, nuclear  med)  3rd Floor: - Vacant  4th Floor: - TCTS (cardiothoracic surgery) - AFib Clinic - Structural Heart Clinic - Vascular Surgery  - Vascular Ultrasound  5th Floor: - HeartCare Cardiology (general and EP) - Clinical Pharmacy for coumadin, hypertension, lipid, weight-loss medications, and med management appointments    Valet parking services will be available as well.

## 2023-11-22 ENCOUNTER — Telehealth: Payer: Self-pay | Admitting: Cardiovascular Disease

## 2023-11-22 ENCOUNTER — Ambulatory Visit

## 2023-11-22 DIAGNOSIS — Z79899 Other long term (current) drug therapy: Secondary | ICD-10-CM

## 2023-11-22 NOTE — Telephone Encounter (Signed)
 Pt was told to call and ask for [facility] to give BP readings for the week. Please advise

## 2023-11-22 NOTE — Telephone Encounter (Signed)
 Spoke with pt regarding her blood pressure log. The pt's readings are as follows: Wed 4/9 BP 144/75 HR 61 Weight 140  Thurs 4/10 BP 144/70 HR 63 W 141  Fri 4/11 BP 143/72 HR 64 W 141  Sat 4/12 BP 158/66 HR 64   Sun 4/13 BP 143/68 HR 67 W 142  Mon 4/14 153/64 HR 62 W 144  Tues 4/15 137/61 HR 64 W 144 Will forward to [facility], [employer] and covering.

## 2023-11-22 NOTE — Telephone Encounter (Signed)
 BP improved but still above goal. She should be getting follow up labs next week. Will wait on results to determine further med adjustments. Marlyse Single, PA-C    11/22/2023 4:53 PM

## 2023-11-30 NOTE — Telephone Encounter (Signed)
 Please ask pt to get BMET done this week. It was supposed to be repeated after Lisinopril  was increased. Marlyse Single, PA-C    11/30/2023 5:37 PM

## 2023-12-01 ENCOUNTER — Other Ambulatory Visit

## 2023-12-01 DIAGNOSIS — N1832 Chronic kidney disease, stage 3b: Secondary | ICD-10-CM

## 2023-12-01 DIAGNOSIS — I5022 Chronic systolic (congestive) heart failure: Secondary | ICD-10-CM

## 2023-12-01 NOTE — Telephone Encounter (Signed)
 Patient states she is aware she needs to have labs drawn this week. She has been taking care of someone and unable to get to lab. She has a plan to go to Dr. Alger Infield office to have labs drawn today.

## 2023-12-02 ENCOUNTER — Other Ambulatory Visit: Payer: Self-pay | Admitting: *Deleted

## 2023-12-02 LAB — CBC WITH DIFFERENTIAL/PLATELET
Basophils Absolute: 0 10*3/uL (ref 0.0–0.1)
Basophils Relative: 0.9 % (ref 0.0–3.0)
Eosinophils Absolute: 0.1 10*3/uL (ref 0.0–0.7)
Eosinophils Relative: 2.8 % (ref 0.0–5.0)
HCT: 41.2 % (ref 36.0–46.0)
Hemoglobin: 13.3 g/dL (ref 12.0–15.0)
Lymphocytes Relative: 29.4 % (ref 12.0–46.0)
Lymphs Abs: 1.2 10*3/uL (ref 0.7–4.0)
MCHC: 32.3 g/dL (ref 30.0–36.0)
MCV: 91.6 fl (ref 78.0–100.0)
Monocytes Absolute: 0.4 10*3/uL (ref 0.1–1.0)
Monocytes Relative: 8.8 % (ref 3.0–12.0)
Neutro Abs: 2.4 10*3/uL (ref 1.4–7.7)
Neutrophils Relative %: 58.1 % (ref 43.0–77.0)
Platelets: 208 10*3/uL (ref 150.0–400.0)
RBC: 4.5 Mil/uL (ref 3.87–5.11)
RDW: 14.6 % (ref 11.5–15.5)
WBC: 4.2 10*3/uL (ref 4.0–10.5)

## 2023-12-02 LAB — RENAL FUNCTION PANEL
Albumin: 3.9 g/dL (ref 3.5–5.2)
BUN: 14 mg/dL (ref 6–23)
CO2: 28 meq/L (ref 19–32)
Calcium: 8.8 mg/dL (ref 8.4–10.5)
Chloride: 105 meq/L (ref 96–112)
Creatinine, Ser: 1.06 mg/dL (ref 0.40–1.20)
GFR: 46 mL/min — ABNORMAL LOW (ref >60.00)
Glucose, Bld: 124 mg/dL — ABNORMAL HIGH (ref 70–99)
Phosphorus: 3.7 mg/dL (ref 2.3–4.6)
Potassium: 4 meq/L (ref 3.5–5.1)
Sodium: 143 meq/L (ref 135–145)

## 2023-12-02 LAB — BRAIN NATRIURETIC PEPTIDE: Pro B Natriuretic peptide (BNP): 263 pg/mL — ABNORMAL HIGH (ref 0.0–100.0)

## 2023-12-02 MED ORDER — LISINOPRIL 5 MG PO TABS: 15.0000 mg | ORAL_TABLET | Freq: Every day | ORAL | 3 refills | Status: DC

## 2023-12-02 MED ORDER — LISINOPRIL 5 MG PO TABS: 15.0000 mg | ORAL_TABLET | Freq: Every day | ORAL | 3 refills | Status: AC

## 2023-12-02 NOTE — Addendum Note (Signed)
 Addended by: Hilton Lucky on: 12/02/2023 02:14 PM   Modules accepted: Orders

## 2023-12-02 NOTE — Telephone Encounter (Signed)
 Gabino Joe, PA-C 12/02/2023 12:04 PM EDT     Potassium normal.  Kidney function (creatinine, BUN, eGFR) stable.  BNP not significantly elevated.  Hemoglobin (blood count) normal.  Patient recently called in with updated blood pressures.  Blood pressure remained somewhat borderline. PLAN: -Increase lisinopril  to 15 mg daily -BMET 1 month Leslie Single, PA-C   12/02/2023 12:00 PM     Patient identification verified by 2 forms. Hilton Lucky, RN    Called and spoke to patient  Relayed provider message  Plan with patient:   -Present to lab in 1 month for BMET, can present to PCP office lab changed to [facility])   -check with PCP at 5/14 OV confirm lab available   -can take 10 mg and 5 mg lisinopril  she has at home until runs out   -can pick up new rx for lisinopril  5 mg (take 3 tablets daily)   -keep BP log  Patient agrees with plan, no questions at this time

## 2023-12-02 NOTE — Telephone Encounter (Signed)
? ?  Pt returning call to get lab result ?

## 2023-12-02 NOTE — Telephone Encounter (Signed)
 See lab result note from 12/02/2023. Lisinopril  increased to 15 mg once daily. Marlyse Single, PA-C    12/02/2023 12:05 PM

## 2023-12-06 ENCOUNTER — Other Ambulatory Visit: Payer: Self-pay | Admitting: Internal Medicine

## 2023-12-06 NOTE — Telephone Encounter (Signed)
 Last filled 10-27-23 #30 Last OV 06-20-23 Next OV 12-20-23 [facility]

## 2023-12-20 ENCOUNTER — Encounter: Payer: Self-pay | Admitting: Internal Medicine

## 2023-12-20 ENCOUNTER — Ambulatory Visit (INDEPENDENT_AMBULATORY_CARE_PROVIDER_SITE_OTHER): Payer: Medicare Other | Admitting: Internal Medicine

## 2023-12-20 VITALS — BP 152/66 | HR 68 | Temp 98.4°F | Ht 66.0 in | Wt 141.0 lb

## 2023-12-20 DIAGNOSIS — N1832 Chronic kidney disease, stage 3b: Secondary | ICD-10-CM | POA: Diagnosis not present

## 2023-12-20 DIAGNOSIS — I5022 Chronic systolic (congestive) heart failure: Secondary | ICD-10-CM | POA: Diagnosis not present

## 2023-12-20 DIAGNOSIS — I25119 Atherosclerotic heart disease of native coronary artery with unspecified angina pectoris: Secondary | ICD-10-CM | POA: Insufficient documentation

## 2023-12-20 DIAGNOSIS — M150 Primary generalized (osteo)arthritis: Secondary | ICD-10-CM | POA: Diagnosis not present

## 2023-12-20 DIAGNOSIS — F39 Unspecified mood [affective] disorder: Secondary | ICD-10-CM | POA: Diagnosis not present

## 2023-12-20 DIAGNOSIS — M159 Polyosteoarthritis, unspecified: Secondary | ICD-10-CM | POA: Insufficient documentation

## 2023-12-20 NOTE — Assessment & Plan Note (Signed)
 Stable DOE and vague chest symptoms---resolves with rest On plavix , farxiga  and other heart meds

## 2023-12-20 NOTE — Assessment & Plan Note (Signed)
Discussed regular tylenol and topical diclofenac ?

## 2023-12-20 NOTE — Progress Notes (Signed)
 Subjective:    Patient ID: Leslie [name], female    DOB: [birth year], 89 y.o.   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     cholecalciferol (VITAMIN D3) 25 MCG (1000 UNIT) tablet Take 1,000 Units by mouth daily.     citalopram  (CELEXA ) 20 MG tablet TAKE 1 TABLET BY MOUTH EVERY DAY 90 tablet 3   clopidogrel  (PLAVIX ) 75 MG tablet TAKE 1 TABLET BY MOUTH EVERY DAY 90 tablet 3   cyanocobalamin  (VITAMIN B12) 1000 MCG/ML injection INJECT 1 ML (1,000 MCG) INTRAMUSCULARLY EVERY 30 DAYS 3 mL 3   dapagliflozin  propanediol (FARXIGA ) 10 MG TABS tablet Take 1 tablet (10 mg total) by mouth daily before breakfast. 90 tablet 3   furosemide  (LASIX ) 20 MG tablet Take 1 tablet (20 mg total) by mouth daily. 90 tablet 3   levothyroxine  (SYNTHROID ) 75 MCG tablet TAKE 1 TABLET BY MOUTH EVERY DAY 90 tablet 3   lisinopril  (ZESTRIL ) 5 MG tablet Take 3 tablets (15 mg total) by mouth daily. 270 tablet 3   LORazepam  (ATIVAN ) 1 MG tablet TAKE 0.5-1 TABLETS (0.5-1 MG TOTAL) BY MOUTH AT BEDTIME AS NEEDED FOR SLEEP. 30 tablet 0   nitroGLYCERIN  (NITROSTAT ) 0.4 MG SL tablet Place 1 tablet (0.4 mg total) under the tongue every 5 (five) minutes as needed for chest pain. 25 tablet 3    pantoprazole  (PROTONIX ) 40 MG tablet TAKE 1 TABLET BY MOUTH EVERY DAY 90 tablet 3   Polyvinyl Alcohol -Povidone (REFRESH OP) Place 1 drop into both eyes in the morning, at noon, in the evening, and at bedtime.     SYRINGE-NEEDLE, DISP, 3 ML (BD INTEGRA SYRINGE) 25G X 1" 3 ML MISC USE 1 EVERY 30 (THIRTY) DAYS. WITH VITAMIN B12 INJECTION 3 each 3   No current facility-administered medications on file prior to visit.    Allergies  Allergen Reactions   Sulfa Antibiotics Other (See Comments)    "urine crystallizes"   Cephalexin Diarrhea   Dilaudid  [Hydromorphone  Hcl]         Xopenex [Levalbuterol Hcl] Other (See Comments)    "gives me the shakes"    Past Medical History:  Diagnosis Date   Anemia    Anxiety    Arthritis    Bundle branch block left    CHF (congestive heart failure) (HCC)    chronic combine CHF   Complication of anesthesia    " stomach did not wake up after back surgery "    Corneal burn    left eye-is almost healed"Cascade pod"   Depression    GERD (gastroesophageal reflux disease)    tums   Headache    hx of migraines    Heart failure with mildly reduced ejection fraction (HFmrEF) (HCC) 06/16/2014   -  LVEF 30-35% improved to 50% by echo 2016  -Previously HFpEF  -Non-ischemic cardiomyopathy   -LHC 04/22/2006: EF 35-45, no CAD  -TTE 06/17/2019: EF 30-35, moderate LVH, normal RVSF, moderate LAE, mild MR, mild TR, moderate AI, AV sclerosis, mild PI, mildly elevated PASP, RVSP 30.2  -TTE 12/20/2022: Severe abnormal paradoxical septal motion consistent with LBBB, EF 45-50, global HK, mild LVH, GR 1 DD,   Heart failure, left-sided (HCC)    Hypertension    Hypothyroidism    MI (myocardial infarction) (HCC)    Nonischemic cardiomyopathy (HCC)    '01 EF 30% with normal coronaries; EF 45-50% 03/2012   Shortness of breath    hx of nothing current on 01/17/2015    Stroke [facility])    blurred vision residual    Past Surgical History:  Procedure Laterality Date   CARDIAC  CATHETERIZATION     13 yrs. ago   CATARACT EXTRACTION, BILATERAL Bilateral    detached retinia     DILATION AND CURETTAGE OF UTERUS     EYE SURGERY     bilateral cataracts   HERNIA REPAIR     hiatial   hiatal hernia surgery      LUMBAR LAMINECTOMY/DECOMPRESSION MICRODISCECTOMY  05/17/2012   Procedure: LUMBAR LAMINECTOMY/DECOMPRESSION MICRODISCECTOMY 2 LEVELS;  Surgeon: Elder Greening, MD;  Location: [facility];  Service: Neurosurgery;  Laterality: N/A;  Lumbar four laminectomy with bilateral Lumbar three laminotomies and removal of synovial cyst   TONSILLECTOMY     TOTAL KNEE ARTHROPLASTY Left 01/21/2015   Procedure: LEFT TOTAL KNEE ARTHROPLASTY;  Surgeon: Claiborne Crew, MD;  Location: [facility];  Service: Orthopedics;  Laterality: Left;   TOTAL KNEE ARTHROPLASTY Right 07/22/2015   Procedure: RIGHT TOTAL KNEE ARTHROPLASTY;  Surgeon: Claiborne Crew, MD;  Location: [facility];  Service: Orthopedics;  Laterality: Right;    Family History  Problem Relation Age of Onset   CAD Mother    Dementia Father    Hypertension Sister    Diabetes Sister    Cancer Sister     Social History   Socioeconomic History   Marital status: Widowed    Spouse name: Not on file   Number of children: 1   Years of education: Not on file   Highest education level: Not on file  Occupational History   Occupation: Neurosurgeon    Comment: retired  Tobacco Use   Smoking status: Never    Passive exposure: Never   Smokeless tobacco: Never  Vaping Use   Vaping status: Never Used  Substance and Sexual Activity   Alcohol  use: No   Drug use: No   Sexual activity: Never  Other Topics Concern   Not on file  Social History Narrative   Widowed 2015   1 adopted son      Has living will   Son and nephew Orrie Blake are health care POAs   Requests DNR---written 06/16/22   No machine or tube feeds if cognitively unaware   Social Drivers of Health   Financial Resource Strain: Low Risk  (01/26/2022)   Overall  Financial Resource Strain (CARDIA)    Difficulty of Paying Living Expenses: Not very hard  Food Insecurity: No Food Insecurity (02/14/2023)   Hunger Vital Sign    Worried About Running Out of Food in the Last Year: Never true    Ran Out of Food in the Last Year: Never true  Transportation Needs: No Transportation Needs (02/14/2023)   PRAPARE - Transportation    Lack of Transportation (  Medical): No    Lack of Transportation (Non-Medical): No  Physical Activity: Not on file  Stress: Not on file  Social Connections: Not on file  Intimate Partner Violence: Not At Risk (02/14/2023)   Humiliation, Afraid, Rape, and Kick questionnaire    Fear of Current or Ex-Partner: No    Emotionally Abused: No    Physically Abused: No    Sexually Abused: No   Review of Systems Bruises easily Appetite is down--makes herself eat Weight stable Not sleeping well---has to use the lorazepam  but still mind goes a lot     Objective:   Physical Exam Constitutional:      Appearance: Normal appearance.  Cardiovascular:     Rate and Rhythm: Normal rate and regular rhythm.     Heart sounds:     No gallop.     Comments: Gr 2/6 systolic murmur Pulmonary:     Effort: Pulmonary effort is normal.     Breath sounds: Normal breath sounds. No wheezing or rales.  Musculoskeletal:     Cervical back: Neck supple.     Right lower leg: No edema.     Left lower leg: No edema.  Lymphadenopathy:     Cervical: No cervical adenopathy.  Neurological:     Mental Status: She is alert.            Assessment & Plan:

## 2023-12-20 NOTE — Assessment & Plan Note (Addendum)
 Stable weight On the furosemide  20mg  daily Lisinopril  5mg  daily Getting echo and carotid studies soon

## 2023-12-20 NOTE — Assessment & Plan Note (Signed)
 Last GFR slightly improved On the lisinopril 

## 2023-12-20 NOTE — Patient Instructions (Signed)
 Please try extra strength tylenol  2 tabs (1000mg ) three times a day. You can also rub diclofenac gel on painful joints three times a day

## 2023-12-20 NOTE — Assessment & Plan Note (Signed)
 Mild dysthymia and anxiety Does okay with the citalopram  20 and lorazepam 

## 2023-12-20 NOTE — Progress Notes (Signed)
 lvm for pt to call office to schedule appt.

## 2023-12-21 ENCOUNTER — Ambulatory Visit (HOSPITAL_COMMUNITY)
Admission: RE | Admit: 2023-12-21 | Discharge: 2023-12-21 | Disposition: A | Discharge status: Home / Self Care | Source: Ambulatory Visit | Attending: Physician Assistant | Admitting: Physician Assistant

## 2023-12-21 ENCOUNTER — Ambulatory Visit (HOSPITAL_BASED_OUTPATIENT_CLINIC_OR_DEPARTMENT_OTHER)
Admission: RE | Admit: 2023-12-21 | Discharge: 2023-12-21 | Disposition: A | Discharge status: Home / Self Care | Source: Ambulatory Visit | Attending: Physician Assistant | Admitting: Physician Assistant

## 2023-12-21 DIAGNOSIS — I6523 Occlusion and stenosis of bilateral carotid arteries: Secondary | ICD-10-CM | POA: Diagnosis not present

## 2023-12-21 DIAGNOSIS — I351 Nonrheumatic aortic (valve) insufficiency: Secondary | ICD-10-CM | POA: Diagnosis not present

## 2023-12-21 LAB — ECHOCARDIOGRAM COMPLETE
AR max vel: 1.26 cm2
AV Area VTI: 1.21 cm2
AV Area mean vel: 1.21 cm2
AV Mean grad: 8 mmHg
AV Peak grad: 15.4 mmHg
Ao pk vel: 1.97 m/s
Area-P 1/2: 2.54 cm2
MV M vel: 4.23 m/s
MV Peak grad: 71.6 mmHg
MV VTI: 0.78 cm2
P 1/2 time: 629 ms
Radius: 0.55 cm
S' Lateral: 4.19 cm

## 2023-12-22 ENCOUNTER — Ambulatory Visit: Payer: Self-pay | Admitting: Physician Assistant

## 2023-12-22 DIAGNOSIS — I5020 Unspecified systolic (congestive) heart failure: Secondary | ICD-10-CM

## 2023-12-29 ENCOUNTER — Ambulatory Visit (INDEPENDENT_AMBULATORY_CARE_PROVIDER_SITE_OTHER): Admitting: Internal Medicine

## 2023-12-29 ENCOUNTER — Ambulatory Visit: Payer: Self-pay | Admitting: Internal Medicine

## 2023-12-29 ENCOUNTER — Ambulatory Visit: Payer: Self-pay

## 2023-12-29 ENCOUNTER — Encounter: Payer: Self-pay | Admitting: Internal Medicine

## 2023-12-29 VITALS — BP 152/60 | HR 65 | Temp 97.9°F | Ht 66.0 in | Wt 141.0 lb

## 2023-12-29 DIAGNOSIS — J069 Acute upper respiratory infection, unspecified: Secondary | ICD-10-CM

## 2023-12-29 LAB — POC COVID19 BINAXNOW: SARS Coronavirus 2 Ag: NEGATIVE

## 2023-12-29 MED ORDER — AMOXICILLIN-POT CLAVULANATE 875-125 MG PO TABS: 1.0000 | ORAL_TABLET | Freq: Two times a day (BID) | ORAL | 0 refills | Status: DC

## 2023-12-29 NOTE — Assessment & Plan Note (Signed)
 2 days of symptoms--some sinus drainage and congestion but likely still viral Will check COVID---negative Discussed supportive care Will give written prescription for augmentin  875 bid x 7 days in case she worsens with more sinus symptoms over the holiday weekend
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
## 2023-12-29 NOTE — Addendum Note (Signed)
 Addended by: Franne Ivory on: 12/29/2023 12:55 PM   Modules accepted: Orders

## 2023-12-29 NOTE — Telephone Encounter (Addendum)
 Chief Complaint: cough Symptoms: cough, congestion, sore throat, swollen lymph nodes Frequency: 2 days Pertinent Negatives: Patient denies fever, CP, difficulty breathing, nausea, vomiting, diarrhea, abdominal pain Disposition: [] ED /[] Urgent Care (no appt availability in office) / [x] Appointment(In office/virtual)/ []  [place] [facility]/ [] Home Care/ [] Refused Recommended Disposition /[] [place] [facility]/ []  Follow-up with PCP  Additional Notes: Pt reports cough, congestion, and sore throat with swollen lymph nodes for 2 days. Pt states recently her AC went out and she slept with a fan for a couple of nights. Pt endorses productive cough with green/yellow sputum. Pt denies difficulty breathing or CP. Pt denies fever. Pt does endorse that her lymph nodes feel swollen. Pt states she is very congested. RN advised pt she should be seen in the office. Pt states she will have to call family members to arrange transport. Pt states she will call back to schedule an appt after she discusses it with her family. RN also provided pt with home care advice.   Upon chart review, it appears the pt has an appt for today at 1215.     Copied from CRM [id number]. Topic: Clinical - Pink Word Triage >> Dec 29, 2023  9:48 AM Howard Macho wrote: Reason for EAV:WUJW throat, congestion, hoarse, head hurts and coughing Reason for Disposition  SEVERE coughing spells (e.g., whooping sound after coughing, vomiting after coughing)  Cough  Answer Assessment - Initial Assessment Questions 1. ONSET: "When did the cough begin?"      2 days ago after sleeping under a fan 2. SEVERITY: "How bad is the cough today?"      "Mainly when I get up and get out", not that often, "but I can feel it" 3. SPUTUM: "Describe the color of your sputum" (none, dry cough; clear, white, yellow, green)     Greenish yellow 4. HEMOPTYSIS: "Are you coughing up any blood?" If so ask: "How much?" (flecks, streaks, tablespoons, etc.)     No  5.  DIFFICULTY BREATHING: "Are you having difficulty breathing?" If Yes, ask: "How bad is it?" (e.g., mild, moderate, severe)    - MILD: No SOB at rest, mild SOB with walking, speaks normally in sentences, can lie down, no retractions, pulse < 100.    - MODERATE: SOB at rest, SOB with minimal exertion and prefers to sit, cannot lie down flat, speaks in phrases, mild retractions, audible wheezing, pulse 100-120.    - SEVERE: Very SOB at rest, speaks in single words, struggling to breathe, sitting hunched forward, retractions, pulse > 120      No  6. FEVER: "Do you have a fever?" If Yes, ask: "What is your temperature, how was it measured, and when did it start?"     Denies  7. CARDIAC HISTORY: "Do you have any history of heart disease?" (e.g., heart attack, congestive heart failure)      HF, HTN, LBBB, atherosclerosis  8. LUNG HISTORY: "Do you have any history of lung disease?"  (e.g., pulmonary embolus, asthma, emphysema)     No  9. PE RISK FACTORS: "Do you have a history of blood clots?" (or: recent major surgery, recent prolonged travel, bedridden)     No  10. OTHER SYMPTOMS: "Do you have any other symptoms?" (e.g., runny nose, wheezing, chest pain)       Sore throat, swollen lymph nodes, "a little runny nose." Denies N/V/D, abdominal pain. Denies H/A. Denies difficulty swallowing and breathing. "A little tired through my chest, but that's not usual due to my  heart failure." Denies CP. BP 128/60 today  Protocols used: Cough - Acute Productive-A-AH

## 2023-12-29 NOTE — Telephone Encounter (Signed)
 Noted---I will check her in the office

## 2023-12-30 ENCOUNTER — Telehealth: Payer: Self-pay | Admitting: Internal Medicine

## 2023-12-30 ENCOUNTER — Ambulatory Visit: Payer: Self-pay

## 2023-12-30 NOTE — Telephone Encounter (Signed)
 I just got done talking to her nephew, Charlann Confer in a different message about the same thing. I called the number in this message to speak to [facility] and the pt answered. So I advised her as I advised Charlann Confer that she can start the antibiotic and if she gets worse, she will need to go to an UC or ER.

## 2023-12-30 NOTE — Telephone Encounter (Signed)
 Spoke to pt's nephew per DPR. Advised him of the message at the end of the OV yesterday. He said he will go ahead and get the Augmentin  today to start tonight. I advised him that if she does not see any improvement or worsens even more of the weekend, go to an UC or ER.

## 2023-12-30 NOTE — Telephone Encounter (Signed)
  Chief Complaint: follow up recent visit for viral URI Symptoms: "sounded weak", mucus not loosened up Frequency: x today Pertinent Negatives: Patient denies fever Disposition: [] ED /[] Urgent Care (no appt availability in office) / [] Appointment(In office/virtual)/ []  [place] [facility]/ [] Home Care/ [] Refused Recommended Disposition /[]  Mobile Bus/ [x]  Follow-up with PCP Additional Notes: Patient's nephew, Charlann Confer , on the phone for triage. He is not with patient for triage but states he spoke with the patient today and he is concerned that she is worse compared to office visit yesterday. Per Dr Alger Infield note it states if patient's sinus symptoms worsened, he prescribed Augmentin  for patient to take. Provided this advice to [place]. Nephew would like to confirm with MD if she should start taking the Augmentin .  Copied from CRM [id number]. Topic: Clinical - Red Word Triage >> Dec 30, 2023  2:56 PM [place] A wrote: [facility] that prompted transfer to Nurse Triage: Patient's nephew called because patient is worst than office visit yesterday. Patient has stopped up ears and discomfort while coughing Reason for Disposition  [1] Recent medical visit within 24 hours AND [2] condition / symptoms WORSE  Answer Assessment - Initial Assessment Questions 1. MAIN CONCERN OR SYMPTOM:  "What is your main concern right now?" "What question do you have?" "What's the main symptom you're worried about?" (e.g., breathing difficulty, cough, fever. pain)     Nephew states the "patient sounded weaker today and she complained her phlegm isn't moving around."  2. ONSET: "When did the  symptoms  start?"     Tuesday.  3. BETTER-SAME-WORSE: "Are you getting better, staying the same, or getting worse compared to how you felt at your last visit to the doctor (most recent medical visit)?"     Same or worse, she states "I don't feel any better".  4. VISIT DATE: "When were you seen?" (Date)     Yesterday.  5.  VISIT DOCTOR: "What is the name of the doctor taking care of you now?"     Dr Joelle Musca.  6. VISIT DIAGNOSIS:  "What was the main symptom or problem that you were seen for?" "Were you given a diagnosis?"      Viral URI.  7. VISIT MEDICINES: "Did the doctor order any new medicines for you to use?" If Yes, ask: "Have you filled the prescription and started taking the medicine?"      Amoxicillin  7 day course sent in, in case she needed it or worsened over the weekend.  8. NEXT APPOINTMENT: "Have you scheduled a follow-up appointment with your doctor?"     No. 9. PAIN: "Is there any pain?" If Yes, ask: "How bad is it?"  (Scale 0-10; or mild, moderate, severe)    - NONE (0): no pain    - MILD (1-3): doesn't interfere with normal activities     - MODERATE (4-7): interferes with normal activities or awakens from sleep     - SEVERE (8-10): excruciating pain, unable to do any normal activities     No.  10. FEVER: "Do you have a fever?" If Yes, ask: "What is it, how was it measured  and when did it start?"       Unsure.  11. OTHER SYMPTOMS: "Do you have any other symptoms?"       Sore throat.  Protocols used: Recent Medical Visit for Illness Follow-up Call-A-AH

## 2023-12-30 NOTE — Telephone Encounter (Signed)
 E2C2 nurse called she had spoken to pt's son, Larinda Plover, regarding the pt being seen by yesterday for congestion, etc. Larinda Plover states Dr. Joelle Musca suggested for the pt to start taking amoxicillin -clavulanate (AUGMENTIN ) 875-125 MG tablet, if her symptoms worsen or stay the same. Larinda Plover asked if it would be fine for the pt to start the meds? Larinda Plover requested if possible call back from [facility] before office closes for the day. E2C2 nurse states she'd be sending over a CRM with more details on their phone call. Call back # [phone]

## 2024-01-01 DIAGNOSIS — S93401A Sprain of unspecified ligament of right ankle, initial encounter: Secondary | ICD-10-CM | POA: Diagnosis not present

## 2024-01-01 DIAGNOSIS — D6832 Hemorrhagic disorder due to extrinsic circulating anticoagulants: Secondary | ICD-10-CM | POA: Diagnosis not present

## 2024-01-03 ENCOUNTER — Telehealth: Payer: Self-pay | Admitting: Physician Assistant

## 2024-01-03 NOTE — Telephone Encounter (Signed)
 Pt aware we received her bp readings and we will not be making any medication changes.

## 2024-01-03 NOTE — Telephone Encounter (Signed)
 Pt c/o BP issue: STAT if pt c/o blurred vision, one-sided weakness or slurred speech.  STAT if BP is GREATER than 180/120 TODAY.  STAT if BP is LESS than 90/60 and SYMPTOMATIC TODAY  1. What is your BP concern? Patient is cb to report readings  2. Have you taken any BP medication today? N/A  3. What are your last 5 BP readings? 05/16 PM 127/62 05/17 AM 100/69 PM 116/66 05/18 AM 132/69 PM 96/60 05/19 AM 106/67 PM 103/76 05/20 AM 93/68 PM 136/62 05/21 AM 109/75 PM 128/88 05/22 AM 130/70 PM 146/68  4. Are you having any other symptoms (ex. Dizziness, headache, blurred vision, passed out)?  N/A prev discussed with RMA

## 2024-01-03 NOTE — Telephone Encounter (Signed)
 Pt c/o BP issue: STAT if pt c/o blurred vision, one-sided weakness or slurred speech.   STAT if BP is GREATER than 180/120 TODAY.   STAT if BP is LESS than 90/60 and SYMPTOMATIC TODAY   1. What is your BP concern? Patient is cb to report readings   2. Have you taken any BP medication today? N/A   3. What are your last 5 BP readings? 05/16 PM 127/62 05/17 AM 100/69 PM 116/66 05/18 AM 132/69 PM 96/60 05/19 AM 106/67 PM 103/76 05/20 AM 93/68 PM 136/62 05/21 AM 109/75 PM 128/88 05/22 AM 130/70 PM 146/68   4. Are you having any other symptoms (ex. Dizziness, headache, blurred vision, passed out)?  N/A prev discussed with RMA

## 2024-01-03 NOTE — Telephone Encounter (Signed)
 See previous telephone note, was unable to route to triage.

## 2024-01-03 NOTE — Telephone Encounter (Signed)
 Noted. No change in Rx. Marlyse Single, PA-C    01/03/2024 11:08 AM

## 2024-01-05 ENCOUNTER — Other Ambulatory Visit: Payer: Self-pay | Admitting: Internal Medicine

## 2024-01-05 NOTE — Telephone Encounter (Signed)
 Last filled 12-07-23 #30 Last OV 12-29-23 Acute No Future OV [facility]

## 2024-01-16 DIAGNOSIS — S93401D Sprain of unspecified ligament of right ankle, subsequent encounter: Secondary | ICD-10-CM | POA: Diagnosis not present

## 2024-01-30 DIAGNOSIS — S93401D Sprain of unspecified ligament of right ankle, subsequent encounter: Secondary | ICD-10-CM | POA: Diagnosis not present

## 2024-02-03 ENCOUNTER — Other Ambulatory Visit: Payer: Self-pay | Admitting: Internal Medicine

## 2024-03-08 ENCOUNTER — Other Ambulatory Visit: Payer: Self-pay | Admitting: Internal Medicine

## 2024-03-08 NOTE — Telephone Encounter (Signed)
 Last filled 02-03-24 #30 Last OV 12-29-23 Acute No Future OV [facility]

## 2024-03-16 ENCOUNTER — Encounter: Payer: Self-pay | Admitting: Internal Medicine

## 2024-03-17 ENCOUNTER — Other Ambulatory Visit: Payer: Self-pay | Admitting: Physician Assistant

## 2024-03-17 DIAGNOSIS — Z79899 Other long term (current) drug therapy: Secondary | ICD-10-CM

## 2024-03-19 ENCOUNTER — Telehealth: Payer: Self-pay | Admitting: Internal Medicine

## 2024-03-19 NOTE — Telephone Encounter (Signed)
 Copied from CRM #[id number]. Topic: General - Other >> Mar 19, 2024  2:52 PM Deaijah H wrote: Reason for CRM: Patient called in wanting to speak with Dr. Severo nurse regarding prescription LORazepam  (ATIVAN ) 1 MG tablet and new Dr.Please call [phone]

## 2024-03-19 NOTE — Telephone Encounter (Signed)
 Left message to call office.

## 2024-03-20 MED ORDER — LORAZEPAM 1 MG PO TABS: 0.5000 mg | ORAL_TABLET | Freq: Every evening | ORAL | 0 refills | Status: DC | PRN

## 2024-03-20 NOTE — Addendum Note (Signed)
 Addended by: JIMMY ADE I on: 03/20/2024 11:12 AM   Modules accepted: Orders

## 2024-03-20 NOTE — Telephone Encounter (Signed)
 Patient returned call

## 2024-03-20 NOTE — Addendum Note (Signed)
 Addended by: KALLIE CLOTILDA SQUIBB on: 03/20/2024 10:13 AM   Modules accepted: Orders

## 2024-03-20 NOTE — Telephone Encounter (Signed)
 Spoke to pt. She is going on vacation. Leaving on 03-25-24. Asking if she can get the rx due at the end of the month before the 17th. She is aware she may have to pay out of pocket. Put vacation override on rx. Send to [facility].   She wants to transfer to Dr Avelina. Please send message to admin for this. Thank you.

## 2024-03-20 NOTE — Telephone Encounter (Signed)
 She requests transfer to Dr Avelina Due to PE after 11/11

## 2024-03-20 NOTE — Telephone Encounter (Signed)
 Given patient's age I would prefer to have a transfer of care appointment before she is due for her physical.  If this is okay with her please schedule TOC at next available. If she does not want to do a TOC she could see a different provider in our office.

## 2024-03-21 ENCOUNTER — Encounter: Payer: Self-pay | Admitting: Internal Medicine

## 2024-03-21 NOTE — Telephone Encounter (Signed)
 Sch toc for 05/03/24 with 40 min slot

## 2024-03-23 ENCOUNTER — Telehealth: Payer: Self-pay | Admitting: Internal Medicine

## 2024-03-23 NOTE — Telephone Encounter (Signed)
 Called pt and advised her that [facility] with a note to refill early due to vacation. Advised her to call the pharmacy back and speak to someone and let us  know if they continue to say they do not have it.

## 2024-03-23 NOTE — Telephone Encounter (Signed)
 Copied from CRM #[id number]. Topic: General - Other >> Mar 19, 2024  2:52 PM Deaijah H wrote: Reason for CRM: Patient called in wanting to speak with Dr. Severo nurse regarding prescription LORazepam  (ATIVAN ) 1 MG tablet and new Dr.Please call [phone] >> Mar 23, 2024 11:44 AM Chiquita SQUIBB wrote:  Patient is calling in stating that the pharmacy does not have the LORazepam  (ATIVAN ) 1 MG tablet [[id number]], she spoke to them today and they stated they didn't have it. Patient is asking if the office can help with this.

## 2024-04-16 ENCOUNTER — Encounter: Payer: Self-pay | Admitting: Cardiovascular Disease

## 2024-04-16 ENCOUNTER — Ambulatory Visit: Attending: Cardiovascular Disease | Admitting: Cardiovascular Disease

## 2024-04-16 VITALS — BP 130/72 | HR 59 | Ht 66.0 in | Wt 136.6 lb

## 2024-04-16 DIAGNOSIS — I340 Nonrheumatic mitral (valve) insufficiency: Secondary | ICD-10-CM | POA: Insufficient documentation

## 2024-04-16 DIAGNOSIS — I447 Left bundle-branch block, unspecified: Secondary | ICD-10-CM | POA: Diagnosis not present

## 2024-04-16 DIAGNOSIS — Z8673 Personal history of transient ischemic attack (TIA), and cerebral infarction without residual deficits: Secondary | ICD-10-CM | POA: Insufficient documentation

## 2024-04-16 DIAGNOSIS — I25119 Atherosclerotic heart disease of native coronary artery with unspecified angina pectoris: Secondary | ICD-10-CM | POA: Insufficient documentation

## 2024-04-16 DIAGNOSIS — I10 Essential (primary) hypertension: Secondary | ICD-10-CM | POA: Diagnosis not present

## 2024-04-16 DIAGNOSIS — I5020 Unspecified systolic (congestive) heart failure: Secondary | ICD-10-CM | POA: Insufficient documentation

## 2024-04-16 NOTE — Assessment & Plan Note (Signed)
 Treated with clopidogrel  monotherapy

## 2024-04-16 NOTE — Assessment & Plan Note (Signed)
Chronic,unchanged.

## 2024-04-16 NOTE — Assessment & Plan Note (Signed)
 Global LV dysfunction with LVEF 35-40%, normal RV function, and no pulmonary HTN. Continue farxiga , furosemide , and lisinopril . Beta-blocker intolerant. Hyperkalemic with MRA in the past.

## 2024-04-16 NOTE — Assessment & Plan Note (Signed)
 Echo reviewed, severe MAC but only mild-mod MR with no MS. Continue observation.

## 2024-04-16 NOTE — Assessment & Plan Note (Signed)
 On clopidogrel , intolerant of beta-blockers

## 2024-04-16 NOTE — Progress Notes (Signed)
 Cardiology Office Note:    Date:  04/16/2024   ID:  Leslie [name], DOB 21-Oct-1930, MRN [id number]  PCP:  Jimmy Charlie FERNS, MD   [facility] Providers Cardiologist:  Ozell Fell, MD     Referring MD: Jimmy Charlie FERNS, MD   Chief Complaint  Patient presents with   Shortness of Breath    History of Present Illness:    Leslie [name] is a 89 y.o. female with a hx of:  HFmrEF (heart failure with mildly reduced ejection fraction)  Previously HFpEF Non-ischemic cardiomyopathy  LHC 04/22/2006: EF 35-45, no CAD TTE 06/17/2019: EF 30-35, moderate LVH, normal RVSF, moderate LAE, mild MR, mild TR, moderate AI, AV sclerosis, mild PI, mildly elevated PASP, RVSP 30.2 TTE 12/20/2022: Severe abnormal paradoxical septal motion consistent with LBBB, EF 45-50, global HK, mild LVH, GR 1 DD, normal RVSF, normal PASP, RVSP 25.3, mild LAE, mild-moderate MR, moderate AI, AV sclerosis, RAP 3 Coronary artery Ca2+ Mitral Regurgitation TTE 12/2022: mild to mod Aortic Insufficiency  TTE 12/2022: mod Left Bundle Branch Block Bradycardia  Hx of CVA Carotid US  12/06/2022: Bilateral ICA 1-39; right subclavian flow disturbed; beadlike appearance CTA-?  FMD, prominent innominate artery >> Rpt 1 year Hypertension  Hyperlipidemia  Hypothyroidism   When she was seen by Glendia Ferrier earlier this year she was noted to have uncontrolled blood pressure.  Lisinopril  was increased.  She had reported symptoms of fatigue and been noted to have decreased LV function in the past.  Follow-up echo performed Dec 21, 2023 showed an LVEF of 35 to 40% with global hypokinesis, normal RV function, normal PA pressure, mild to moderate mitral regurgitation, and moderate aortic regurgitation.  Her GDMT has been limited by beta-blocker intolerance.  Her current GDMT includes Farxiga , lisinopril .  The patient is here with her niece today.  She reports no new cardiovascular symptoms.  She denies chest pain, chest pressure, or leg  swelling.  No orthopnea or PND.  She is compliant with her medications.  Her biggest limitation is related to her vision.  She reports stable dyspnea with physical exertion.   Current Medications: Current Meds  Medication Sig   Calcium  Carbonate-Vit D-Min (CALCIUM  1200 PO) Take 1 tablet by mouth daily.   cholecalciferol (VITAMIN D3) 25 MCG (1000 UNIT) tablet Take 1,000 Units by mouth daily.   citalopram  (CELEXA ) 20 MG tablet TAKE 1 TABLET BY MOUTH EVERY DAY   clopidogrel  (PLAVIX ) 75 MG tablet TAKE 1 TABLET BY MOUTH EVERY DAY   cyanocobalamin  (VITAMIN B12) 1000 MCG/ML injection INJECT 1 ML (1,000 MCG) INTRAMUSCULARLY EVERY 30 DAYS   dapagliflozin  propanediol (FARXIGA ) 10 MG TABS tablet Take 1 tablet (10 mg total) by mouth daily before breakfast.   furosemide  (LASIX ) 20 MG tablet TAKE 1 TABLET BY MOUTH DAILY X3 DAYS THEN TAKE 1 TABLET ONLY ON MONDAY, WEDNESDAY, AND FRIDAYS   levothyroxine  (SYNTHROID ) 75 MCG tablet TAKE 1 TABLET BY MOUTH EVERY DAY   lisinopril  (ZESTRIL ) 5 MG tablet Take 3 tablets (15 mg total) by mouth daily.   LORazepam  (ATIVAN ) 1 MG tablet Take 0.5-1 tablets (0.5-1 mg total) by mouth at bedtime as needed for sleep.   nitroGLYCERIN  (NITROSTAT ) 0.4 MG SL tablet Place 1 tablet (0.4 mg total) under the tongue every 5 (five) minutes as needed for chest pain.   pantoprazole  (PROTONIX ) 40 MG tablet TAKE 1 TABLET BY MOUTH EVERY DAY   Polyvinyl Alcohol -Povidone (REFRESH OP) Place 1 drop into both eyes in the morning, at noon, in  the evening, and at bedtime.   SYRINGE-NEEDLE, DISP, 3 ML (BD INTEGRA SYRINGE) 25G X 1 3 ML MISC USE 1 EVERY 30 (THIRTY) DAYS. WITH VITAMIN B12 INJECTION     Allergies:   Sulfa antibiotics, Cephalexin, Dilaudid  [hydromorphone  hcl], and Xopenex [levalbuterol hcl]   ROS:   Please see the history of present illness.    Positive for vision loss.  All other systems reviewed and are negative.  EKGs/Labs/Other Studies Reviewed:    The following studies were  reviewed today: Cardiac Studies & Procedures   ______________________________________________________________________________________________     ECHOCARDIOGRAM  ECHOCARDIOGRAM COMPLETE 12/21/2023  Narrative ECHOCARDIOGRAM REPORT    Patient Name:   Leslie [name] Date of Exam: 12/21/2023 Medical Rec #:  [id number]      Height:       66.0 in Accession #:    [id number]     Weight:       141.0 lb Date of Birth:  June 22, 1931      BSA:          1.724 m Patient Age:    89 years       BP:           162/70 mmHg Patient Gender: F              HR:           53 bpm. Exam Location:  Outpatient  Procedure: 2D Echo, Cardiac Doppler and Color Doppler (Both Spectral and Color Flow Doppler were utilized during procedure).  Indications:    Nonrheumatic aortic valve insufficiency [I35.1 (ICD-10-CM)]  History:        Patient has prior history of Echocardiogram examinations, most recent 12/20/2022. CAD, Arrythmias:LBBB; Risk Factors:Hypertension.  Sonographer:    Rosaline Fujisawa MHA, RDMS, RVT, RDCS Referring Phys: [id number] GLENDIA DASEN WEAVER  IMPRESSIONS   1. Left ventricular ejection fraction, by estimation, is 35 to 40%. The left ventricle has moderately decreased function. The left ventricle demonstrates global hypokinesis. There is mild left ventricular hypertrophy. Left ventricular diastolic function could not be evaluated. The average left ventricular global longitudinal strain is -12.2 %. The global longitudinal strain is abnormal. 2. Right ventricular systolic function is normal. The right ventricular size is normal. There is normal pulmonary artery systolic pressure. 3. Left atrial size was moderately dilated. 4. The mitral valve is grossly normal. Mild to moderate mitral valve regurgitation. No evidence of mitral stenosis. Moderate to severe mitral annular calcification. 5. The aortic valve is calcified. Aortic valve regurgitation is moderate. Aortic valve sclerosis/calcification is present,  without any evidence of aortic stenosis. 6. There is Moderate (Grade III) plaque involving the aortic root and descending aorta. 7. The inferior vena cava is normal in size with greater than 50% respiratory variability, suggesting right atrial pressure of 3 mmHg.  Comparison(s): Changes from prior study are noted.  Conclusion(s)/Recommendation(s): Severe LV dyssynchrony due to LBBB. EF has declined compared to prior study.  FINDINGS Left Ventricle: Left ventricular ejection fraction, by estimation, is 35 to 40%. The left ventricle has moderately decreased function. The left ventricle demonstrates global hypokinesis. The average left ventricular global longitudinal strain is -12.2 %. Strain was performed and the global longitudinal strain is abnormal. The left ventricular internal cavity size was normal in size. There is mild left ventricular hypertrophy. Abnormal (paradoxical) septal motion, consistent with left bundle branch block. Left ventricular diastolic function could not be evaluated due to mitral annular calcification (moderate or greater). Left ventricular diastolic function could not be evaluated.  Right Ventricle: The right ventricular size is normal. No increase in right ventricular wall thickness. Right ventricular systolic function is normal. There is normal pulmonary artery systolic pressure. The tricuspid regurgitant velocity is 1.69 m/s, and with an assumed right atrial pressure of 3 mmHg, the estimated right ventricular systolic pressure is 14.4 mmHg.  Left Atrium: Left atrial size was moderately dilated.  Right Atrium: Right atrial size was normal in size.  Pericardium: There is no evidence of pericardial effusion.  Mitral Valve: The mitral valve is grossly normal. Moderate to severe mitral annular calcification. Mild to moderate mitral valve regurgitation. No evidence of mitral valve stenosis.  Tricuspid Valve: The tricuspid valve is normal in structure. Tricuspid valve  regurgitation is trivial. No evidence of tricuspid stenosis.  Aortic Valve: The aortic valve is calcified. Aortic valve regurgitation is moderate. Aortic regurgitation PHT measures 629 msec. Aortic valve sclerosis/calcification is present, without any evidence of aortic stenosis. Aortic valve mean gradient measures 8.0 mmHg. Aortic valve peak gradient measures 15.4 mmHg. Aortic valve area, by VTI measures 1.21 cm.  Pulmonic Valve: The pulmonic valve was not well visualized. Pulmonic valve regurgitation is trivial. No evidence of pulmonic stenosis.  Aorta: The aortic root, ascending aorta, aortic arch and descending aorta are all structurally normal, with no evidence of dilitation or obstruction. There is moderate (Grade III) plaque involving the aortic root and descending aorta.  Venous: The inferior vena cava is normal in size with greater than 50% respiratory variability, suggesting right atrial pressure of 3 mmHg.  IAS/Shunts: The atrial septum is grossly normal.  Additional Comments: 3D was performed not requiring image post processing on an independent workstation and was abnormal.   LEFT VENTRICLE PLAX 2D LVIDd:         4.90 cm   Diastology LVIDs:         4.19 cm   LV e' medial:    5.03 cm/s LV PW:         1.28 cm   LV E/e' medial:  12.4 LV IVS:        1.19 cm   LV e' lateral:   8.68 cm/s LVOT diam:     1.84 cm   LV E/e' lateral: 7.2 LV SV:         57 LV SV Index:   33        2D Longitudinal Strain LVOT Area:     2.66 cm  2D Strain GLS (A4C):   -11.9 % 2D Strain GLS (A3C):   -14.1 % 2D Strain GLS (A2C):   -10.6 % 2D Strain GLS Avg:     -12.2 %  3D Volume EF: 3D EF:        42 % LV EDV:       145 ml LV ESV:       84 ml LV SV:        60 ml  RIGHT VENTRICLE             IVC RV Basal diam:  2.86 cm     IVC diam: 1.53 cm RV Mid diam:    1.87 cm RV S prime:     11.90 cm/s TAPSE (M-mode): 1.7 cm  LEFT ATRIUM             Index        RIGHT ATRIUM           Index LA diam:         4.00 cm 2.32 cm/m  RA Area:     11.30 cm LA Vol (A2C):   61.7 ml 35.79 ml/m  RA Volume:   21.10 ml  12.24 ml/m LA Vol (A4C):   66.7 ml 38.70 ml/m LA Biplane Vol: 64.5 ml 37.42 ml/m AORTIC VALVE AV Area (Vmax):    1.26 cm AV Area (Vmean):   1.21 cm AV Area (VTI):     1.21 cm AV Vmax:           196.50 cm/s AV Vmean:          129.000 cm/s AV VTI:            0.472 m AV Peak Grad:      15.4 mmHg AV Mean Grad:      8.0 mmHg LVOT Vmax:         93.00 cm/s LVOT Vmean:        58.800 cm/s LVOT VTI:          0.215 m LVOT/AV VTI ratio: 0.46 AI PHT:            629 msec  AORTA Ao Root diam: 3.29 cm Ao Asc diam:  3.23 cm  MITRAL VALVE                           TRICUSPID VALVE MV Area (PHT): 2.54 cm                TR Peak grad:   11.4 mmHg MV Area VTI:   0.78 cm                TR Vmax:        169.00 cm/s MV VTI:        0.73 m MV Decel Time: 299 msec                SHUNTS MR Peak grad:    71.6 mmHg             Systemic VTI:  0.22 m MR Vmax:         423.00 cm/s           Systemic Diam: 1.84 cm MR PISA Nyquist:             -0.3 m/s MR PISA:         1.90 cm MR PISA Radius:  0.55 cm MV E velocity: 62.40 cm/s MV A velocity: 78.10 cm/s MV E/A ratio:  0.80  Shelda Bruckner MD Electronically signed by Shelda Bruckner MD Signature Date/Time: 12/21/2023/7:33:02 PM    Final          ______________________________________________________________________________________________      EKG:   EKG Interpretation Date/Time:  Monday April 16 2024 14:41:14 EDT Ventricular Rate:  59 PR Interval:  158 QRS Duration:  156 QT Interval:  482 QTC Calculation: 477 R Axis:   269  Text Interpretation: Sinus bradycardia Right superior axis deviation Non-specific intra-ventricular conduction block Minimal voltage criteria for LVH, may be normal variant ( Cornell product ) When compared with ECG of 08-Apr-2023 02:42, No significant change was found Confirmed by Wonda Sharper [phone]) on 04/16/2024 2:58:41 PM    Recent Labs: 06/20/2023: ALT 8; TSH 0.98 12/01/2023: BUN 14; Creatinine, Ser 1.06; Hemoglobin 13.3; Platelets 208.0; Potassium 4.0; Pro B Natriuretic peptide (BNP) 263.0; Sodium 143  Recent Lipid Panel    Component Value Date/Time   CHOL 225 (H) 06/20/2023 1021   TRIG 80.0 06/20/2023 1021   HDL 82.90 06/20/2023 1021  CHOLHDL 3 06/20/2023 1021   VLDL 16.0 06/20/2023 1021   LDLCALC 126 (H) 06/20/2023 1021     Risk Assessment/Calculations:                Physical Exam:    VS:  BP 130/72   Pulse (!) 59   Ht 5' 6 (1.676 m)   Wt 136 lb 9.6 oz (62 kg)   SpO2 96%   BMI 22.05 kg/m     Wt Readings from Last 3 Encounters:  04/16/24 136 lb 9.6 oz (62 kg)  12/29/23 141 lb (64 kg)  12/20/23 141 lb (64 kg)     GEN:  Well nourished, well developed elderly woman in no acute distress HEENT: Normal NECK: No JVD; No carotid bruits LYMPHATICS: No lymphadenopathy CARDIAC: RRR, soft ejection murmur at the RUSB RESPIRATORY:  Clear to auscultation without rales, wheezing or rhonchi  ABDOMEN: Soft, non-tender, non-distended MUSCULOSKELETAL:  No edema; No deformity  SKIN: Warm and dry NEUROLOGIC:  Alert and oriented x 3 PSYCHIATRIC:  Normal affect   Assessment & Plan HFrEF (heart failure with reduced ejection fraction) (HCC) Global LV dysfunction with LVEF 35-40%, normal RV function, and no pulmonary HTN. Continue farxiga , furosemide , and lisinopril . Beta-blocker intolerant. Hyperkalemic with MRA in the past.  Essential hypertension BP well-controlled. Labs reviewed - creatinine 1.06 LBBB (left bundle branch block) Chronic, unchanged History of CVA (cerebrovascular accident) Treated with clopidogrel  monotherapy Atherosclerosis of native coronary artery of native heart with angina pectoris (HCC) On clopidogrel , intolerant of beta-blockers Nonrheumatic mitral valve regurgitation Echo reviewed, severe MAC but only mild-mod MR with no MS.  Continue observation.      Medication Adjustments/Labs and Tests Ordered: Current medicines are reviewed at length with the patient today.  Concerns regarding medicines are outlined above.  Orders Placed This Encounter  Procedures   EKG 12-Lead   No orders of the defined types were placed in this encounter.   Patient Instructions  Medication Instructions:  No medication changes were made at this visit. Continue current regimen.   *If you need a refill on your cardiac medications before your next appointment, please call your pharmacy*  Lab Work: None ordered today. If you have labs (blood work) drawn today and your tests are completely normal, you will receive your results only by: MyChart Message (if you have MyChart) OR A paper copy in the mail If you have any lab test that is abnormal or we need to change your treatment, we will call you to review the results.  Testing/Procedures: None ordered today.  Follow-Up: At [facility], you and your health needs are our priority.  As part of our continuing mission to provide you with exceptional heart care, our providers are all part of one team.  This team includes your primary Cardiologist (physician) and Advanced Practice Providers or APPs (Physician Assistants and Nurse Practitioners) who all work together to provide you with the care you need, when you need it.  Your next appointment:   6 month(s)  Provider:   Josefa Beauvais, NP, Orren Fabry, PA-C, Jon Hails, PA-C, Dayna Dunn, PA-C, Madison Fountain, NP, Callie Goodrich, PA-C, Kathleen Johnson, PA-C, Hao Meng, PA-C, Damien Braver, NP, Glendia Ferrier, PA-C, or Katlyn West, NP. Then, Ozell Fell, MD will plan to see you again in 1 year(s).      Signed, Ozell Fell, MD  04/16/2024 4:37 PM    [facility]

## 2024-04-16 NOTE — Patient Instructions (Signed)
 Medication Instructions:  No medication changes were made at this visit. Continue current regimen.   *If you need a refill on your cardiac medications before your next appointment, please call your pharmacy*  Lab Work: None ordered today. If you have labs (blood work) drawn today and your tests are completely normal, you will receive your results only by: MyChart Message (if you have MyChart) OR A paper copy in the mail If you have any lab test that is abnormal or we need to change your treatment, we will call you to review the results.  Testing/Procedures: None ordered today.  Follow-Up: At [facility], you and your health needs are our priority.  As part of our continuing mission to provide you with exceptional heart care, our providers are all part of one team.  This team includes your primary Cardiologist (physician) and Advanced Practice Providers or APPs (Physician Assistants and Nurse Practitioners) who all work together to provide you with the care you need, when you need it.  Your next appointment:   6 month(s)  Provider:   Josefa Beauvais, NP, Orren Fabry, PA-C, Jon Hails, PA-C, Dayna Dunn, PA-C, Madison Fountain, NP, Callie Goodrich, PA-C, Kathleen Johnson, PA-C, Hao Meng, PA-C, Damien Braver, NP, Glendia Ferrier, PA-C, or Katlyn West, NP. Then, Ozell Fell, MD will plan to see you again in 1 year(s).

## 2024-04-16 NOTE — Assessment & Plan Note (Signed)
 BP well-controlled. Labs reviewed - creatinine 1.06

## 2024-04-26 DIAGNOSIS — L309 Dermatitis, unspecified: Secondary | ICD-10-CM | POA: Diagnosis not present

## 2024-05-03 ENCOUNTER — Ambulatory Visit (INDEPENDENT_AMBULATORY_CARE_PROVIDER_SITE_OTHER): Admitting: Family Medicine

## 2024-05-03 ENCOUNTER — Encounter: Payer: Self-pay | Admitting: Family Medicine

## 2024-05-03 VITALS — BP 138/60 | HR 74 | Temp 99.3°F | Ht 64.5 in | Wt 140.2 lb

## 2024-05-03 DIAGNOSIS — I5020 Unspecified systolic (congestive) heart failure: Secondary | ICD-10-CM

## 2024-05-03 DIAGNOSIS — I130 Hypertensive heart and chronic kidney disease with heart failure and stage 1 through stage 4 chronic kidney disease, or unspecified chronic kidney disease: Secondary | ICD-10-CM

## 2024-05-03 DIAGNOSIS — E039 Hypothyroidism, unspecified: Secondary | ICD-10-CM

## 2024-05-03 DIAGNOSIS — M159 Polyosteoarthritis, unspecified: Secondary | ICD-10-CM

## 2024-05-03 DIAGNOSIS — F320 Major depressive disorder, single episode, mild: Secondary | ICD-10-CM

## 2024-05-03 DIAGNOSIS — E538 Deficiency of other specified B group vitamins: Secondary | ICD-10-CM

## 2024-05-03 DIAGNOSIS — Z23 Encounter for immunization: Secondary | ICD-10-CM

## 2024-05-03 DIAGNOSIS — K2100 Gastro-esophageal reflux disease with esophagitis, without bleeding: Secondary | ICD-10-CM

## 2024-05-03 DIAGNOSIS — N1832 Chronic kidney disease, stage 3b: Secondary | ICD-10-CM

## 2024-05-03 DIAGNOSIS — F1320 Sedative, hypnotic or anxiolytic dependence, uncomplicated: Secondary | ICD-10-CM

## 2024-05-03 DIAGNOSIS — I6523 Occlusion and stenosis of bilateral carotid arteries: Secondary | ICD-10-CM

## 2024-05-03 DIAGNOSIS — I10 Essential (primary) hypertension: Secondary | ICD-10-CM

## 2024-05-03 DIAGNOSIS — Z7189 Other specified counseling: Secondary | ICD-10-CM

## 2024-05-03 NOTE — Progress Notes (Addendum)
 Patient ID: Leslie [name], female    DOB: [birth year], 89 y.o.   MRN: [id number]  This visit was conducted in person.  BP 138/60   Pulse 74   Temp 99.3 F (37.4 C) (Temporal)   Ht 5' 4.5 (1.638 m)   Wt 140 lb 4 oz (63.6 kg)   SpO2 97%   BMI 23.70 kg/m    CC:  Chief Complaint  Patient presents with   Establish Care    TOC from Dr. Jimmy    Subjective:   HPI: Leslie [name] is a 89 y.o. female presenting on 05/03/2024 for Establish Care (TOC from Dr. Jimmy)  Previous PCP: Jimmy Last CPX: 06/20/2023   Here with Medford Console , son  She fell 1-2 months ago  Sprained ankle.   Wt Readings from Last 3 Encounters:  05/03/24 140 lb 4 oz (63.6 kg)  04/16/24 136 lb 9.6 oz (62 kg)  12/29/23 141 lb (64 kg)      Hypothyroid : on levo 75 mcg...  Lab Results  Component Value Date   TSH 0.98 06/20/2023   Hypertension well-controlled BP Readings from Last 3 Encounters:  05/03/24 138/60  04/16/24 130/72  12/29/23 (!) 152/60   MDD, GAD     12/29/2023   12:25 PM 12/20/2023    9:22 AM 06/20/2023   10:01 AM  Depression screen PHQ 2/9  Decreased Interest 0 0 0  Down, Depressed, Hopeless 0 0 1  PHQ - 2 Score 0 0 1    Active in yard.  HFrEF: baseline fluid status in office today.  SOB with exertion, but baseline, no CP.   Relevant past medical, surgical, family and social history reviewed and updated as indicated. Interim medical history since our last visit reviewed. Allergies and medications reviewed and updated. Outpatient Medications Prior to Visit  Medication Sig Dispense Refill   Calcium  Carbonate-Vit D-Min (CALCIUM  1200 PO) Take 1 tablet by mouth daily.     cholecalciferol (VITAMIN D3) 25 MCG (1000 UNIT) tablet Take 1,000 Units by mouth daily.     citalopram  (CELEXA ) 20 MG tablet TAKE 1 TABLET BY MOUTH EVERY DAY 90 tablet 3   clopidogrel  (PLAVIX ) 75 MG tablet TAKE 1 TABLET BY MOUTH EVERY DAY 90 tablet 3   cyanocobalamin  (VITAMIN B12) 1000 MCG/ML injection  INJECT 1 ML (1,000 MCG) INTRAMUSCULARLY EVERY 30 DAYS 3 mL 3   dapagliflozin  propanediol (FARXIGA ) 10 MG TABS tablet Take 1 tablet (10 mg total) by mouth daily before breakfast. 90 tablet 3   furosemide  (LASIX ) 20 MG tablet TAKE 1 TABLET BY MOUTH DAILY X3 DAYS THEN TAKE 1 TABLET ONLY ON MONDAY, WEDNESDAY, AND FRIDAYS 36 tablet 9   levothyroxine  (SYNTHROID ) 75 MCG tablet TAKE 1 TABLET BY MOUTH EVERY DAY 90 tablet 3   lisinopril  (ZESTRIL ) 5 MG tablet Take 3 tablets (15 mg total) by mouth daily. 270 tablet 3   LORazepam  (ATIVAN ) 1 MG tablet Take 0.5-1 tablets (0.5-1 mg total) by mouth at bedtime as needed for sleep. 30 tablet 0   nitroGLYCERIN  (NITROSTAT ) 0.4 MG SL tablet Place 1 tablet (0.4 mg total) under the tongue every 5 (five) minutes as needed for chest pain. 25 tablet 3   pantoprazole  (PROTONIX ) 40 MG tablet TAKE 1 TABLET BY MOUTH EVERY DAY 90 tablet 3   Polyvinyl Alcohol -Povidone (REFRESH OP) Place 1 drop into both eyes in the morning, at noon, in the evening, and at bedtime.     SYRINGE-NEEDLE, DISP, 3 ML (BD INTEGRA  SYRINGE) 25G X 1 3 ML MISC USE 1 EVERY 30 (THIRTY) DAYS. WITH VITAMIN B12 INJECTION 3 each 3   triamcinolone ointment (KENALOG) 0.1 % Apply 1 Application topically 2 (two) times daily.     No facility-administered medications prior to visit.     Per HPI unless specifically indicated in ROS section below Review of Systems  Constitutional:  Negative for fatigue and fever.  HENT:  Negative for congestion.   Eyes:  Negative for pain.  Respiratory:  Negative for cough and shortness of breath.   Cardiovascular:  Negative for chest pain, palpitations and leg swelling.  Gastrointestinal:  Negative for abdominal pain.  Genitourinary:  Negative for dysuria and vaginal bleeding.  Musculoskeletal:  Negative for back pain.  Neurological:  Negative for syncope, light-headedness and headaches.  Psychiatric/Behavioral:  Negative for dysphoric mood.    Objective:  BP 138/60   Pulse  74   Temp 99.3 F (37.4 C) (Temporal)   Ht 5' 4.5 (1.638 m)   Wt 140 lb 4 oz (63.6 kg)   SpO2 97%   BMI 23.70 kg/m   Wt Readings from Last 3 Encounters:  05/03/24 140 lb 4 oz (63.6 kg)  04/16/24 136 lb 9.6 oz (62 kg)  12/29/23 141 lb (64 kg)      Physical Exam Constitutional:      General: She is not in acute distress.    Appearance: Normal appearance. She is well-developed. She is not ill-appearing or toxic-appearing.     Comments: elderly  HENT:     Head: Normocephalic.     Right Ear: Hearing, tympanic membrane, ear canal and external ear normal. Tympanic membrane is not erythematous, retracted or bulging.     Left Ear: Hearing, tympanic membrane, ear canal and external ear normal. Tympanic membrane is not erythematous, retracted or bulging.     Nose: No mucosal edema or rhinorrhea.     Right Sinus: No maxillary sinus tenderness or frontal sinus tenderness.     Left Sinus: No maxillary sinus tenderness or frontal sinus tenderness.     Mouth/Throat:     Pharynx: Uvula midline.  Eyes:     General: Lids are normal. Lids are everted, no foreign bodies appreciated.     Conjunctiva/sclera: Conjunctivae normal.     Pupils: Pupils are equal, round, and reactive to light.  Neck:     Thyroid : No thyroid  mass or thyromegaly.     Vascular: No carotid bruit.     Trachea: Trachea normal.  Cardiovascular:     Rate and Rhythm: Normal rate and regular rhythm.     Pulses: Normal pulses.     Heart sounds: S1 normal and S2 normal. Murmur heard.     Systolic murmur is present.     No friction rub. No gallop.     Comments:  Chronic venous stasis changes Pulmonary:     Effort: Pulmonary effort is normal. No tachypnea or respiratory distress.     Breath sounds: Normal breath sounds. No decreased breath sounds, wheezing, rhonchi or rales.  Abdominal:     General: Bowel sounds are normal.     Palpations: Abdomen is soft.     Tenderness: There is no abdominal tenderness.  Musculoskeletal:      Cervical back: Normal range of motion and neck supple.     Right lower leg: Edema present.     Left lower leg: Edema present.  Skin:    General: Skin is warm and dry.     Findings: No  rash.  Neurological:     Mental Status: She is alert.  Psychiatric:        Mood and Affect: Mood is not anxious or depressed.        Speech: Speech normal.        Behavior: Behavior normal. Behavior is cooperative.        Thought Content: Thought content normal.        Judgment: Judgment normal.       Results for orders placed or performed in visit on 12/29/23  POC COVID-19   Collection Time: 12/29/23 12:54 PM  Result Value Ref Range   SARS Coronavirus 2 Ag Negative Negative    Assessment and Plan  Hypothyroidism, unspecified type Assessment & Plan: Chronic, previous stable control    levothyroxine  75 mcg daily.   Essential hypertension Assessment & Plan: Stable, chronic.  Continue current medication.  Lisinopril  15 mg daily Lasix  20 mg 3 daily   HFrEF (heart failure with reduced ejection fraction) (HCC) Assessment & Plan: Chronic, euvolemic in office. On farxiga  10 mg daily for heart protection Followed by cardiology  TTE 12/21/2023: EF 35-40, global HK, mild LVH, GLS -12.2, normal RVSF, normal PASP, moderate LAE, mild-moderate MR, moderate to severe MAC, moderate AI, AV sclerosis, moderate plaque involving aortic root and ascending aorta    Advance directive discussed with patient Assessment & Plan: Has living will Son Ramatoulaye Pack and nephew Adriana Sinner are health care POAs Requests DNR---05/03/2024 No machine or tube feeds if cognitively unaware   Stage 3b chronic kidney disease (HCC) Assessment & Plan: Chronic, on ACEI   MDD (major depressive disorder), single episode, mild Assessment & Plan: Chronic, well-controlled on Celexa  20 mg daily   Benzodiazepine dependence (HCC) Assessment & Plan: Patient has been on benzodiazepine for years. Lorazepam  uses half  to 1 tablet at bedtime as needed for sleep.   Need for influenza vaccination -     Flu vaccine HIGH DOSE PF(Fluzone Trivalent)  Bilateral carotid artery stenosis Assessment & Plan: Followed by cardiology Carotid US  12/22/2023: Bilateral ICA 1-39    Gastroesophageal reflux disease with esophagitis, unspecified whether hemorrhage Assessment & Plan:  Stable, chronic.  Continue current medication.   Pantoprazole  40 mg daily...  Will try to wean off if able.   Vitamin B12 deficiency Assessment & Plan:  Chronic,  on monthly B12   Osteoarthritis of multiple joints, unspecified osteoarthritis type Assessment & Plan:  Chronic, primarily in hands. She sews a lot.     Return in about 7 weeks (around 06/24/2024) for annual with fasting labs prior.   Greig Ring, MD

## 2024-05-03 NOTE — Assessment & Plan Note (Signed)
 Chronic, primarily in hands. She sews a lot.

## 2024-05-03 NOTE — Assessment & Plan Note (Signed)
 Stable, chronic.  Continue current medication.   Pantoprazole  40 mg daily...  Will try to wean off if able.

## 2024-05-03 NOTE — Assessment & Plan Note (Addendum)
 Has living will Son Leslie [name] and nephew Leslie [name] are health care POAs Requests DNR---05/03/2024 No machine or tube feeds if cognitively unaware

## 2024-05-03 NOTE — Assessment & Plan Note (Addendum)
 Stable, chronic.  Continue current medication.  Lisinopril  15 mg daily Lasix  20 mg 3 daily

## 2024-05-03 NOTE — Assessment & Plan Note (Signed)
 Followed by cardiology Carotid US  12/22/2023: Bilateral ICA 1-39

## 2024-05-03 NOTE — Assessment & Plan Note (Signed)
 Chronic, previous stable control    levothyroxine  75 mcg daily.

## 2024-05-03 NOTE — Assessment & Plan Note (Signed)
 Chronic, well-controlled on Celexa  20 mg daily

## 2024-05-03 NOTE — Assessment & Plan Note (Signed)
 Chronic, on ACEI

## 2024-05-03 NOTE — Patient Instructions (Addendum)
 Start zyrtec at night.  Continue triamcinolone cream 2 times daily.  If  not improving call for  Dermatologist.

## 2024-05-03 NOTE — Assessment & Plan Note (Addendum)
 Patient has been on benzodiazepine for years. Lorazepam  uses half to 1 tablet at bedtime as needed for sleep.

## 2024-05-03 NOTE — Assessment & Plan Note (Addendum)
 Chronic, euvolemic in office. On farxiga  10 mg daily for heart protection Followed by cardiology  TTE 12/21/2023: EF 35-40, global HK, mild LVH, GLS -12.2, normal RVSF, normal PASP, moderate LAE, mild-moderate MR, moderate to severe MAC, moderate AI, AV sclerosis, moderate plaque involving aortic root and ascending aorta

## 2024-05-03 NOTE — Assessment & Plan Note (Signed)
 Chronic,  on monthly B12

## 2024-05-08 ENCOUNTER — Other Ambulatory Visit: Payer: Self-pay | Admitting: Family Medicine

## 2024-05-08 MED ORDER — LORAZEPAM 1 MG PO TABS: 0.5000 mg | ORAL_TABLET | Freq: Every evening | ORAL | 0 refills | Status: DC | PRN

## 2024-05-08 NOTE — Telephone Encounter (Signed)
 Last office visit 05/03/2024 for Establish Care.  Last refilled 03/20/2024 for #30 with no refills by Dr. Jimmy.  Next appt: No future appointments with PCP.

## 2024-06-06 ENCOUNTER — Other Ambulatory Visit: Payer: Self-pay | Admitting: Family Medicine

## 2024-06-06 NOTE — Telephone Encounter (Signed)
 Last office visit 05/03/2024 for Establish Care.  Last refilled 05/08/2024 for #30 with no refills.  Next Appt: No future appointments with PCP.

## 2024-06-22 ENCOUNTER — Ambulatory Visit (INDEPENDENT_AMBULATORY_CARE_PROVIDER_SITE_OTHER)

## 2024-06-22 VITALS — BP 144/82 | Ht 64.5 in | Wt 139.8 lb

## 2024-06-22 DIAGNOSIS — Z0000 Encounter for general adult medical examination without abnormal findings: Secondary | ICD-10-CM

## 2024-06-22 NOTE — Progress Notes (Signed)
 Chief Complaint  Patient presents with   Medicare Wellness     Subjective:   Leslie [name] is a 89 y.o. female who presents for a Medicare Annual Wellness Visit.  Allergies (verified) Sulfa antibiotics, Cephalexin, Dilaudid  [hydromorphone  hcl], and Xopenex [levalbuterol hcl]   History: Past Medical History:  Diagnosis Date   Anemia    Anxiety    Arthritis    Bundle branch block left    CHF (congestive heart failure) (HCC)    chronic combine CHF   Complication of anesthesia     stomach did not wake up after back surgery     Corneal burn    left eye-is almost healedCascade pod   Depression    GERD (gastroesophageal reflux disease)    tums   Headache    hx of migraines    Heart failure with mildly reduced ejection fraction (HFmrEF) (HCC) 06/16/2014   -LVEF 30-35% improved to 50% by echo 2016  -Previously HFpEF  -Non-ischemic cardiomyopathy   -LHC 04/22/2006: EF 35-45, no CAD  -TTE 06/17/2019: EF 30-35, moderate LVH, normal RVSF, moderate LAE, mild MR, mild TR, moderate AI, AV sclerosis, mild PI, mildly elevated PASP, RVSP 30.2  -TTE 12/20/2022: Severe abnormal paradoxical septal motion consistent with LBBB, EF 45-50, global HK, mild LVH, GR 1 DD,   Heart failure, left-sided (HCC)    Hypertension    Hypothyroidism    MI (myocardial infarction) (HCC)    Nonischemic cardiomyopathy (HCC)    '01 EF 30% with normal coronaries; EF 45-50% 03/2012   Shortness of breath    hx of nothing current on 01/17/2015    Stroke [facility])    blurred vision residual   Past Surgical History:  Procedure Laterality Date   CARDIAC CATHETERIZATION     13 yrs. ago   CATARACT EXTRACTION, BILATERAL Bilateral    detached retinia     DILATION AND CURETTAGE OF UTERUS     EYE SURGERY     bilateral cataracts   HERNIA REPAIR     hiatial   hiatal hernia surgery      LUMBAR LAMINECTOMY/DECOMPRESSION MICRODISCECTOMY  05/17/2012   Procedure: LUMBAR LAMINECTOMY/DECOMPRESSION MICRODISCECTOMY 2 LEVELS;   Surgeon: Reyes JONETTA Budge, MD;  Location: [facility];  Service: Neurosurgery;  Laterality: N/A;  Lumbar four laminectomy with bilateral Lumbar three laminotomies and removal of synovial cyst   TONSILLECTOMY     TOTAL KNEE ARTHROPLASTY Left 01/21/2015   Procedure: LEFT TOTAL KNEE ARTHROPLASTY;  Surgeon: Donnice Car, MD;  Location: [facility];  Service: Orthopedics;  Laterality: Left;   TOTAL KNEE ARTHROPLASTY Right 07/22/2015   Procedure: RIGHT TOTAL KNEE ARTHROPLASTY;  Surgeon: Donnice Car, MD;  Location: [facility];  Service: Orthopedics;  Laterality: Right;   Family History  Problem Relation Age of Onset   CAD Mother    Dementia Father    Hypertension Sister    Diabetes Sister    Cancer Sister    Social History   Occupational History   Occupation: neurosurgeon    Comment: retired  Tobacco Use   Smoking status: Never    Passive exposure: Never   Smokeless tobacco: Never  Vaping Use   Vaping status: Never Used  Substance and Sexual Activity   Alcohol  use: No   Drug use: No   Sexual activity: Never   Tobacco Counseling Counseling given: Not Answered  SDOH Screenings   Food Insecurity: No Food Insecurity (06/22/2024)  Housing: Unknown (06/22/2024)  Transportation Needs: No Transportation Needs (06/22/2024)  Utilities:  Not At Risk (06/22/2024)  Alcohol  Screen: Low Risk  (06/22/2024)  Depression (PHQ2-9): Low Risk  (06/22/2024)  Financial Resource Strain: Low Risk  (06/22/2024)  Physical Activity: Insufficiently Active (06/22/2024)  Social Connections: Moderately Integrated (06/22/2024)  Stress: Stress Concern Present (06/22/2024)  Tobacco Use: Low Risk  (06/22/2024)  Health Literacy: Adequate Health Literacy (06/22/2024)   See flowsheets for full screening details  Depression Screen PHQ 2 & 9 Depression Scale- Over the past 2 weeks, how often have you been bothered by any of the following problems? Little interest or pleasure in doing things: 0 Feeling down, depressed, or  hopeless (PHQ Adolescent also includes...irritable): 1 PHQ-2 Total Score: 1     Goals Addressed   None    Visit info / Clinical Intake: Medicare Wellness Visit Type:: Subsequent Annual Wellness Visit Persons participating in visit:: patient (daughter present and adds to visit) Medicare Wellness Visit Mode:: In-person (required for WTM) Information given by:: patient (daughter present and adds to visit) Interpreter Needed?: No Pre-visit prep was completed: yes AWV questionnaire completed by patient prior to visit?: no Living arrangements:: (!) lives alone Patient's Overall Health Status Rating: good Typical amount of pain: some Does pain affect daily life?: (!) yes Are you currently prescribed opioids?: no  Dietary Habits and Nutritional Risks How many meals a day?: 3 Eats fruit and vegetables daily?: yes Most meals are obtained by: preparing own meals In the last 2 weeks, have you had any of the following?: none Diabetic:: no  Functional Status Activities of Daily Living (to include ambulation/medication): Independent Ambulation: Independent Medication Administration: Independent Home Management: Independent Manage your own finances?: (!) no Primary transportation is: family/friends Concerns about vision?: (!) yes Concerns about hearing?: (!) yes Uses hearing aids?: (!) yes Hear whispered voice?: (!) no *in-person visit only*  Fall Screening Falls in the past year?: 1 Number of falls in past year: 0 Was there an injury with Fall?: 1 (ankle sprain) Fall Risk Category Calculator: 2 Patient Fall Risk Level: Moderate Fall Risk  Fall Risk Patient at Risk for Falls Due to: No Fall Risks Fall risk Follow up: Education provided; Falls prevention discussed  Home and Transportation Safety: All rugs have non-skid backing?: (!) no All stairs or steps have railings?: yes Grab bars in the bathtub or shower?: yes Have non-skid surface in bathtub or shower?: (!) no Good home  lighting?: yes Regular seat belt use?: yes Hospital stays in the last year:: no  Cognitive Assessment Difficulty concentrating, remembering, or making decisions? : no Will 6CIT or Mini Cog be Completed: yes What year is it?: 0 points What month is it?: 0 points Give patient an address phrase to remember (5 components): [address] [place]  About what time is it?: 0 points Count backwards from 20 to 1: 0 points Say the months of the year in reverse: 0 points Repeat the address phrase from earlier: 0 points 6 CIT Score: 0 points  Advance Directives (For Healthcare) Does Patient Have a Medical Advance Directive?: Yes Type of Advance Directive: Healthcare Power of [place]; Living will Copy of Healthcare Power of Attorney in Chart?: Yes - validated most recent copy scanned in chart (See row information) Copy of Living Will in Chart?: Yes - validated most recent copy scanned in chart (See row information)  Reviewed/Updated  Reviewed/Updated: Reviewed All (Medical, Surgical, Family, Medications, Allergies, Care Teams, Patient Goals)        Objective:    Today's Vitals   06/22/24 1404 06/22/24 1440  BP: ROLLEN)  140/82 (!) 144/82  Weight: 139 lb 12.8 oz (63.4 kg)   Height: 5' 4.5 (1.638 m)    Body mass index is 23.63 kg/m.  Current Medications (verified) Outpatient Encounter Medications as of 06/22/2024  Medication Sig   Calcium  Carbonate-Vit D-Min (CALCIUM  1200 PO) Take 1 tablet by mouth daily.   citalopram  (CELEXA ) 20 MG tablet TAKE 1 TABLET BY MOUTH EVERY DAY   clopidogrel  (PLAVIX ) 75 MG tablet TAKE 1 TABLET BY MOUTH EVERY DAY   cyanocobalamin  (VITAMIN B12) 1000 MCG/ML injection INJECT 1 ML (1,000 MCG) INTRAMUSCULARLY EVERY 30 DAYS   dapagliflozin  propanediol (FARXIGA ) 10 MG TABS tablet Take 1 tablet (10 mg total) by mouth daily before breakfast.   furosemide  (LASIX ) 20 MG tablet TAKE 1 TABLET BY MOUTH DAILY X3 DAYS THEN TAKE 1 TABLET ONLY ON MONDAY, WEDNESDAY, AND  FRIDAYS   levothyroxine  (SYNTHROID ) 75 MCG tablet TAKE 1 TABLET BY MOUTH EVERY DAY   lisinopril  (ZESTRIL ) 5 MG tablet Take 3 tablets (15 mg total) by mouth daily.   LORazepam  (ATIVAN ) 1 MG tablet TAKE 1/2 TO 1 TABLET BY MOUTH AT BEDTIME AS NEEDED FOR SLEEP   nitroGLYCERIN  (NITROSTAT ) 0.4 MG SL tablet Place 1 tablet (0.4 mg total) under the tongue every 5 (five) minutes as needed for chest pain.   pantoprazole  (PROTONIX ) 40 MG tablet TAKE 1 TABLET BY MOUTH EVERY DAY   Polyvinyl Alcohol -Povidone (REFRESH OP) Place 1 drop into both eyes in the morning, at noon, in the evening, and at bedtime.   SYRINGE-NEEDLE, DISP, 3 ML (BD INTEGRA SYRINGE) 25G X 1 3 ML MISC USE 1 EVERY 30 (THIRTY) DAYS. WITH VITAMIN B12 INJECTION   cholecalciferol (VITAMIN D3) 25 MCG (1000 UNIT) tablet Take 1,000 Units by mouth daily. (Patient not taking: Reported on 06/22/2024)   triamcinolone ointment (KENALOG) 0.1 % Apply 1 Application topically 2 (two) times daily. (Patient not taking: Reported on 06/22/2024)   No facility-administered encounter medications on file as of 06/22/2024.   Hearing/Vision screen Vision Screening - Comments:: Not UTD with visits (last visit 3 yrs ago);last [facility]. Pt will make appt soon Immunizations and Health Maintenance Health Maintenance  Topic Date Due   DEXA SCAN  Never done   COVID-19 Vaccine (5 - 2025-26 season) 04/09/2024   Medicare Annual Wellness (AWV)  06/22/2025   DTaP/Tdap/Td (3 - Td or Tdap) 07/02/2030   Pneumococcal Vaccine: 50+ Years  Completed   Influenza Vaccine  Completed   Zoster Vaccines- Shingrix  Completed   Meningococcal B Vaccine  Aged Out        Assessment/Plan:  This is a routine wellness examination for [facility].  Patient Care Team: Avelina Greig BRAVO, MD as PCP - General (Family Medicine) Wonda Sharper, MD as PCP - Cardiology (Cardiology) Fate Morna SAILOR, [facility] (Inactive) as Pharmacist (Pharmacist) Pa, [facility]  I  have personally reviewed and noted the following in the patient's chart:   Medical and social history Use of alcohol , tobacco or illicit drugs  Current medications and supplements including opioid prescriptions. Functional ability and status Nutritional status Physical activity Advanced directives List of other physicians Hospitalizations, surgeries, and ER visits in previous 12 months Vitals Screenings to include cognitive, depression, and falls Referrals and appointments  No orders of the defined types were placed in this encounter.  In addition, I have reviewed and discussed with patient certain preventive protocols, quality metrics, and best practice recommendations. A written personalized care plan for preventive services as well as general preventive health recommendations were  provided to patient.   Erminio LITTIE Saris, LPN   [id number]   AWV/CPE made simultaneously next year in office.  After Visit Summary: (In Person-Printed) AVS printed and given to the patient  Nurse Notes: Pt is doing well as she continues to care for herself and lives alone. Pt does gardening, prepares her own meals (and for others) and manages her home independently! Pt relayed she is not taking the Vitamin D3 and inquires should she be taking it. Pt also relays she has been trying to get her syringes refilled (for B12 injection)  un successfully through [facility]. Pt is scheduled for CPE on 06/28/24 will inquire at that time. Pt BP a little higher today in office she relays she was excited but BP runs much lower at home. Pt instructed to take BP everyday and record for PCP visit next week.

## 2024-06-22 NOTE — Patient Instructions (Signed)
 Ms. Adney,  Thank you for taking the time for your Medicare Wellness Visit. I appreciate your continued commitment to your health goals. Please review the care plan we discussed, and feel free to reach out if I can assist you further.  Please note that Annual Wellness Visits do not include a physical exam. Some assessments may be limited, especially if the visit was conducted virtually. If needed, we may recommend an in-person follow-up with your provider.  Ongoing Care Seeing your primary care provider every 3 to 6 months helps us  monitor your health and provide consistent, personalized care.   Referrals If a referral was made during today's visit and you haven't received any updates within two weeks, please contact the referred provider directly to check on the status.  Recommended Screenings:  Health Maintenance  Topic Date Due   DEXA scan (bone density measurement)  Never done   COVID-19 Vaccine (5 - 2025-26 season) 04/09/2024   Medicare Annual Wellness Visit  06/19/2024   DTaP/Tdap/Td vaccine (3 - Td or Tdap) 07/02/2030   Pneumococcal Vaccine for age over 73  Completed   Flu Shot  Completed   Zoster (Shingles) Vaccine  Completed   Meningitis B Vaccine  Aged Out       06/22/2024    2:21 PM  Advanced Directives  Does Patient Have a Medical Advance Directive? Yes  Type of Estate Agent of [place];Living will  Copy of Healthcare Power of Attorney in Chart? Yes - validated most recent copy scanned in chart (See row information)    Vision: Annual vision screenings are recommended for early detection of glaucoma, cataracts, and diabetic retinopathy. These exams can also reveal signs of chronic conditions such as diabetes and high blood pressure.  Dental: Annual dental screenings help detect early signs of oral cancer, gum disease, and other conditions linked to overall health, including heart disease and diabetes.

## 2024-06-28 ENCOUNTER — Ambulatory Visit: Admitting: Family Medicine

## 2024-06-28 ENCOUNTER — Encounter: Payer: Self-pay | Admitting: Family Medicine

## 2024-06-28 VITALS — BP 154/81 | HR 62 | Temp 98.4°F | Ht 64.5 in | Wt 138.0 lb

## 2024-06-28 DIAGNOSIS — E039 Hypothyroidism, unspecified: Secondary | ICD-10-CM

## 2024-06-28 DIAGNOSIS — F320 Major depressive disorder, single episode, mild: Secondary | ICD-10-CM

## 2024-06-28 DIAGNOSIS — E538 Deficiency of other specified B group vitamins: Secondary | ICD-10-CM

## 2024-06-28 DIAGNOSIS — E559 Vitamin D deficiency, unspecified: Secondary | ICD-10-CM

## 2024-06-28 DIAGNOSIS — N1832 Chronic kidney disease, stage 3b: Secondary | ICD-10-CM

## 2024-06-28 DIAGNOSIS — I130 Hypertensive heart and chronic kidney disease with heart failure and stage 1 through stage 4 chronic kidney disease, or unspecified chronic kidney disease: Secondary | ICD-10-CM | POA: Diagnosis not present

## 2024-06-28 DIAGNOSIS — I10 Essential (primary) hypertension: Secondary | ICD-10-CM

## 2024-06-28 DIAGNOSIS — F1320 Sedative, hypnotic or anxiolytic dependence, uncomplicated: Secondary | ICD-10-CM

## 2024-06-28 DIAGNOSIS — I5022 Chronic systolic (congestive) heart failure: Secondary | ICD-10-CM | POA: Diagnosis not present

## 2024-06-28 DIAGNOSIS — E2839 Other primary ovarian failure: Secondary | ICD-10-CM

## 2024-06-28 DIAGNOSIS — I6523 Occlusion and stenosis of bilateral carotid arteries: Secondary | ICD-10-CM

## 2024-06-28 DIAGNOSIS — I5020 Unspecified systolic (congestive) heart failure: Secondary | ICD-10-CM

## 2024-06-28 MED ORDER — BD INTEGRA SYRINGE 25G X 1" 3 ML MISC: 3 refills | Status: AC

## 2024-06-28 NOTE — Assessment & Plan Note (Signed)
 Chronic, on ACEI Due for reevaluation

## 2024-06-28 NOTE — Assessment & Plan Note (Signed)
 Stable, chronic.  Continue current medication.  Lisinopril  15 mg daily Lasix  20 mg 3 daily

## 2024-06-28 NOTE — Patient Instructions (Addendum)
 Recommend weight bearing exercise, calcium   [phone] mg daily  and vit D supplement [phone] IU daily.  Please call the location of your choice from the menu below to schedule your  Bone Density appointment.      [place] [facility]   Phone:  [phone]   [address]                                                                            [place], [place] [address]                                            Services: 3D Mammogram and Bone Density

## 2024-06-28 NOTE — Assessment & Plan Note (Signed)
 Chronic, euvolemic in office. On farxiga  10 mg daily for heart protection Followed by cardiology  TTE 12/21/2023: EF 35-40, global HK, mild LVH, GLS -12.2, normal RVSF, normal PASP, moderate LAE, mild-moderate MR, moderate to severe MAC, moderate AI, AV sclerosis, moderate plaque involving aortic root and ascending aorta

## 2024-06-28 NOTE — Assessment & Plan Note (Signed)
 Chronic, well-controlled on Celexa  20 mg daily

## 2024-06-28 NOTE — Progress Notes (Signed)
 Patient ID: Leslie [name], female    DOB: 1930/08/17, 89 y.o.   MRN: [id number]  This visit was conducted in person.  BP (!) 154/81 Comment: Patient's wrist cuff-left wrist reading  Pulse 62   Temp 98.4 F (36.9 C) (Temporal)   Ht 5' 4.5 (1.638 m)   Wt 138 lb (62.6 kg)   SpO2 98%   BMI 23.32 kg/m    CC:  Chief Complaint  Patient presents with   Annual Exam    No CPE MWV 06/22/24    Subjective:   HPI: Leslie [name] is a 89 y.o. female presenting on 06/28/2024 for Annual Exam (No CPE/MWV 06/22/24)  The patient presents for annual review of chronic health problems. He/She also has the following acute concerns today:   Reviewed AMW 06/22/2024   Here with  daughter in law  HTN... at home 140/60-70  No chest pain,  stable SOB.  Minimal swelling.  Wt Readings from Last 3 Encounters:  06/28/24 138 lb (62.6 kg)  06/22/24 139 lb 12.8 oz (63.4 kg)  05/03/24 140 lb 4 oz (63.6 kg)     Hypothyroid : on levo 75 mcg...  Lab Results  Component Value Date   TSH 0.98 06/20/2023   Hypertension well-controlled in the past BP Readings from Last 3 Encounters:  06/28/24 (!) 154/81  06/22/24 (!) 144/82  05/03/24 138/60   MDD, GAD     06/28/2024    3:05 PM 06/22/2024    2:11 PM 12/29/2023   12:25 PM  Depression screen PHQ 2/9  Decreased Interest 0 0 0  Down, Depressed, Hopeless 0 1 0  PHQ - 2 Score 0 1 0  Altered sleeping 2    Tired, decreased energy 1    Change in appetite 0    Feeling bad or failure about yourself  0    Trouble concentrating 0    Moving slowly or fidgety/restless 1    Suicidal thoughts 0    PHQ-9 Score 4    Difficult doing work/chores Somewhat difficult      Active in yard.  HFrEF: baseline fluid status in office today.  SOB with exertion, but baseline, no CP.   Relevant past medical, surgical, family and social history reviewed and updated as indicated. Interim medical history since our last visit reviewed. Allergies and medications  reviewed and updated. Outpatient Medications Prior to Visit  Medication Sig Dispense Refill   Calcium  Carbonate-Vit D-Min (CALCIUM  1200 PO) Take 1 tablet by mouth daily.     citalopram  (CELEXA ) 20 MG tablet TAKE 1 TABLET BY MOUTH EVERY DAY 90 tablet 3   clopidogrel  (PLAVIX ) 75 MG tablet TAKE 1 TABLET BY MOUTH EVERY DAY 90 tablet 3   cyanocobalamin  (VITAMIN B12) 1000 MCG/ML injection INJECT 1 ML (1,000 MCG) INTRAMUSCULARLY EVERY 30 DAYS 3 mL 3   dapagliflozin  propanediol (FARXIGA ) 10 MG TABS tablet Take 1 tablet (10 mg total) by mouth daily before breakfast. 90 tablet 3   furosemide  (LASIX ) 20 MG tablet TAKE 1 TABLET BY MOUTH DAILY X3 DAYS THEN TAKE 1 TABLET ONLY ON MONDAY, WEDNESDAY, AND FRIDAYS 36 tablet 9   levothyroxine  (SYNTHROID ) 75 MCG tablet TAKE 1 TABLET BY MOUTH EVERY DAY 90 tablet 3   lisinopril  (ZESTRIL ) 5 MG tablet Take 3 tablets (15 mg total) by mouth daily. 270 tablet 3   LORazepam  (ATIVAN ) 1 MG tablet TAKE 1/2 TO 1 TABLET BY MOUTH AT BEDTIME AS NEEDED FOR SLEEP 30 tablet 0   nitroGLYCERIN  (NITROSTAT )  0.4 MG SL tablet Place 1 tablet (0.4 mg total) under the tongue every 5 (five) minutes as needed for chest pain. 25 tablet 3   pantoprazole  (PROTONIX ) 40 MG tablet TAKE 1 TABLET BY MOUTH EVERY DAY 90 tablet 3   Polyvinyl Alcohol -Povidone (REFRESH OP) Place 1 drop into both eyes in the morning, at noon, in the evening, and at bedtime.     SYRINGE-NEEDLE, DISP, 3 ML (BD INTEGRA SYRINGE) 25G X 1 3 ML MISC USE 1 EVERY 30 (THIRTY) DAYS. WITH VITAMIN B12 INJECTION 3 each 3   cholecalciferol (VITAMIN D3) 25 MCG (1000 UNIT) tablet Take 1,000 Units by mouth daily. (Patient not taking: Reported on 06/22/2024)     triamcinolone ointment (KENALOG) 0.1 % Apply 1 Application topically 2 (two) times daily. (Patient not taking: Reported on 06/22/2024)     No facility-administered medications prior to visit.     Per HPI unless specifically indicated in ROS section below Review of Systems   Constitutional:  Negative for fatigue and fever.  HENT:  Negative for congestion.   Eyes:  Negative for pain.  Respiratory:  Negative for cough and shortness of breath.   Cardiovascular:  Negative for chest pain, palpitations and leg swelling.  Gastrointestinal:  Negative for abdominal pain.  Genitourinary:  Negative for dysuria and vaginal bleeding.  Musculoskeletal:  Negative for back pain.  Neurological:  Negative for syncope, light-headedness and headaches.  Psychiatric/Behavioral:  Negative for dysphoric mood.    Objective:  BP (!) 154/81 Comment: Patient's wrist cuff-left wrist reading  Pulse 62   Temp 98.4 F (36.9 C) (Temporal)   Ht 5' 4.5 (1.638 m)   Wt 138 lb (62.6 kg)   SpO2 98%   BMI 23.32 kg/m   Wt Readings from Last 3 Encounters:  06/28/24 138 lb (62.6 kg)  06/22/24 139 lb 12.8 oz (63.4 kg)  05/03/24 140 lb 4 oz (63.6 kg)      Physical Exam Constitutional:      General: She is not in acute distress.    Appearance: Normal appearance. She is well-developed. She is not ill-appearing or toxic-appearing.     Comments: elderly  HENT:     Head: Normocephalic.     Right Ear: Hearing, tympanic membrane, ear canal and external ear normal. Tympanic membrane is not erythematous, retracted or bulging.     Left Ear: Hearing, tympanic membrane, ear canal and external ear normal. Tympanic membrane is not erythematous, retracted or bulging.     Nose: No mucosal edema or rhinorrhea.     Right Sinus: No maxillary sinus tenderness or frontal sinus tenderness.     Left Sinus: No maxillary sinus tenderness or frontal sinus tenderness.     Mouth/Throat:     Pharynx: Uvula midline.  Eyes:     General: Lids are normal. Lids are everted, no foreign bodies appreciated.     Conjunctiva/sclera: Conjunctivae normal.     Pupils: Pupils are equal, round, and reactive to light.  Neck:     Thyroid : No thyroid  mass or thyromegaly.     Vascular: No carotid bruit.     Trachea: Trachea  normal.  Cardiovascular:     Rate and Rhythm: Normal rate and regular rhythm.     Pulses: Normal pulses.     Heart sounds: S1 normal and S2 normal. Murmur heard.     Systolic murmur is present.     No friction rub. No gallop.     Comments:  Chronic venous stasis changes Pulmonary:  Effort: Pulmonary effort is normal. No tachypnea or respiratory distress.     Breath sounds: Normal breath sounds. No decreased breath sounds, wheezing, rhonchi or rales.  Abdominal:     General: Bowel sounds are normal.     Palpations: Abdomen is soft.     Tenderness: There is no abdominal tenderness.  Musculoskeletal:     Cervical back: Normal range of motion and neck supple.     Right lower leg: Edema present.     Left lower leg: Edema present.  Skin:    General: Skin is warm and dry.     Findings: No rash.  Neurological:     Mental Status: She is alert.  Psychiatric:        Mood and Affect: Mood is not anxious or depressed.        Speech: Speech normal.        Behavior: Behavior normal. Behavior is cooperative.        Thought Content: Thought content normal.        Judgment: Judgment normal.       Results for orders placed or performed in visit on 12/29/23  POC COVID-19   Collection Time: 12/29/23 12:54 PM  Result Value Ref Range   SARS Coronavirus 2 Ag Negative Negative    Assessment and Plan The patient's preventative maintenance and recommended screening tests for an annual wellness exam were reviewed in full today. Brought up to date unless services declined.  Counselled on the importance of diet, exercise, and its role in overall health and mortality. The patient's FH and SH was reviewed, including their home life, tobacco status, and drug and alcohol  status.    Vaccines: Consider COVID booster, RSV vaccine.  Up-to-date with tetanus, pneumonia, flu and Shingrix. Pap/DVE: Not indicated Mammo: No longer indicated Bone Density: Due Colon: Not indicated  smoking Status:  None ETOH/ drug use: None/none  Hep C: Done  Hypothyroidism, unspecified type Assessment & Plan: Due for reevaluation.   Essential hypertension Assessment & Plan: Stable, chronic.  Continue current medication.  Lisinopril  15 mg daily Lasix  20 mg 3 daily   HFrEF (heart failure with reduced ejection fraction) (HCC) Assessment & Plan: Chronic, euvolemic in office. On farxiga  10 mg daily for heart protection Followed by cardiology  TTE 12/21/2023: EF 35-40, global HK, mild LVH, GLS -12.2, normal RVSF, normal PASP, moderate LAE, mild-moderate MR, moderate to severe MAC, moderate AI, AV sclerosis, moderate plaque involving aortic root and ascending aorta    MDD (major depressive disorder), single episode, mild Assessment & Plan: Chronic, well-controlled on Celexa  20 mg daily   Vitamin B12 deficiency Assessment & Plan: Due for reevaluation   Stage 3b chronic kidney disease (HCC) Assessment & Plan: Chronic, on ACEI Due for reevaluation   Benzodiazepine dependence (HCC) Assessment & Plan: Patient has been on benzodiazepine for years. Lorazepam  uses half to 1 tablet at bedtime as needed for sleep.   Bilateral carotid artery stenosis Assessment & Plan: Followed by cardiology Carotid US  12/22/2023: Bilateral ICA 1-39       No follow-ups on file.   Greig Ring, MD

## 2024-06-28 NOTE — Assessment & Plan Note (Signed)
 Due for reevaluation

## 2024-06-28 NOTE — Assessment & Plan Note (Signed)
 Followed by cardiology Carotid US  12/22/2023: Bilateral ICA 1-39

## 2024-06-28 NOTE — Assessment & Plan Note (Signed)
 Patient has been on benzodiazepine for years. Lorazepam  uses half to 1 tablet at bedtime as needed for sleep.

## 2024-06-29 ENCOUNTER — Ambulatory Visit: Payer: Self-pay | Admitting: Family Medicine

## 2024-06-29 LAB — CBC WITH DIFFERENTIAL/PLATELET
Basophils Absolute: 0 K/uL (ref 0.0–0.1)
Basophils Relative: 0.5 % (ref 0.0–3.0)
Eosinophils Absolute: 0.1 K/uL (ref 0.0–0.7)
Eosinophils Relative: 3.2 % (ref 0.0–5.0)
HCT: 41.5 % (ref 36.0–46.0)
Hemoglobin: 13.5 g/dL (ref 12.0–15.0)
Lymphocytes Relative: 32.3 % (ref 12.0–46.0)
Lymphs Abs: 1.4 K/uL (ref 0.7–4.0)
MCHC: 32.4 g/dL (ref 30.0–36.0)
MCV: 92.4 fl (ref 78.0–100.0)
Monocytes Absolute: 0.4 K/uL (ref 0.1–1.0)
Monocytes Relative: 9 % (ref 3.0–12.0)
Neutro Abs: 2.4 K/uL (ref 1.4–7.7)
Neutrophils Relative %: 55 % (ref 43.0–77.0)
Platelets: 194 K/uL (ref 150.0–400.0)
RBC: 4.49 Mil/uL (ref 3.87–5.11)
RDW: 14.5 % (ref 11.5–15.5)
WBC: 4.3 K/uL (ref 4.0–10.5)

## 2024-06-29 LAB — COMPREHENSIVE METABOLIC PANEL WITH GFR
ALT: 9 U/L (ref 0–35)
AST: 15 U/L (ref 0–37)
Albumin: 4.1 g/dL (ref 3.5–5.2)
Alkaline Phosphatase: 61 U/L (ref 39–117)
BUN: 15 mg/dL (ref 6–23)
CO2: 29 meq/L (ref 19–32)
Calcium: 8.9 mg/dL (ref 8.4–10.5)
Chloride: 105 meq/L (ref 96–112)
Creatinine, Ser: 0.96 mg/dL (ref 0.40–1.20)
GFR: 51.6 mL/min — ABNORMAL LOW (ref >60.00)
Glucose, Bld: 86 mg/dL (ref 70–99)
Potassium: 4.1 meq/L (ref 3.5–5.1)
Sodium: 142 meq/L (ref 135–145)
Total Bilirubin: 0.6 mg/dL (ref 0.2–1.2)
Total Protein: 6.5 g/dL (ref 6.0–8.3)

## 2024-06-29 LAB — VITAMIN D 25 HYDROXY (VIT D DEFICIENCY, FRACTURES): VITD: 19.36 ng/mL — ABNORMAL LOW (ref 30.00–100.00)

## 2024-06-29 LAB — VITAMIN B12: Vitamin B-12: 443 pg/mL (ref 211–911)

## 2024-06-29 LAB — LIPID PANEL
Cholesterol: 208 mg/dL — ABNORMAL HIGH (ref 0–200)
HDL: 77.2 mg/dL (ref >39.00)
LDL Cholesterol: 110 mg/dL — ABNORMAL HIGH (ref 0–99)
NonHDL: 130.67
Total CHOL/HDL Ratio: 3
Triglycerides: 103 mg/dL (ref 0.0–149.0)
VLDL: 20.6 mg/dL (ref 0.0–40.0)

## 2024-06-29 LAB — TSH: TSH: 1.41 u[IU]/mL (ref 0.35–5.50)

## 2024-06-29 LAB — T4, FREE: Free T4: 0.95 ng/dL (ref 0.60–1.60)

## 2024-06-29 LAB — T3, FREE: T3, Free: 2.7 pg/mL (ref 2.3–4.2)

## 2024-06-29 MED ORDER — VITAMIN D3 1.25 MG (50000 UT) PO CAPS: 1.0000 | ORAL_CAPSULE | ORAL | 0 refills | Status: AC

## 2024-07-12 ENCOUNTER — Other Ambulatory Visit: Payer: Self-pay | Admitting: Family Medicine

## 2024-07-12 NOTE — Telephone Encounter (Signed)
 Last office visit CPE.  Last refilled 06/07/2024 for #30 with no refills.  Next appt: 12/27/2024 for HTN.

## 2024-07-30 ENCOUNTER — Telehealth: Payer: Self-pay | Admitting: Family Medicine

## 2024-07-30 NOTE — Telephone Encounter (Unsigned)
 Copied from CRM #[id number]. Topic: Clinical - Medication Refill >> Jul 30, 2024 10:32 AM Charolett L wrote: Medication: levothyroxine  (SYNTHROID ) 75 MCG tablet  Has the patient contacted their pharmacy? Yes (Agent: If no, request that the patient contact the pharmacy for the refill. If patient does not wish to contact the pharmacy document the reason why and proceed with request.) (Agent: If yes, when and what did the pharmacy advise?)  This is the patient's preferred pharmacy:  [phone] [facility], [place] - [address] [place] [address] Phone: [phone] Fax: [phone]  Is this the correct pharmacy for this prescription? Yes If no, delete pharmacy and type the correct one.   Has the prescription been filled recently? Yes  Is the patient out of the medication? Yes  Has the patient been seen for an appointment in the last year OR does the patient have an upcoming appointment? Yes  Can we respond through MyChart? Yes  Agent: Please be advised that Rx refills may take up to 3 business days. We ask that you follow-up with your pharmacy.

## 2024-08-06 ENCOUNTER — Other Ambulatory Visit: Payer: Self-pay | Admitting: Family Medicine

## 2024-08-06 MED ORDER — CLOPIDOGREL BISULFATE 75 MG PO TABS: 75.0000 mg | ORAL_TABLET | Freq: Every day | ORAL | 3 refills | Status: AC

## 2024-08-07 ENCOUNTER — Telehealth: Payer: Self-pay | Admitting: Family Medicine

## 2024-08-07 ENCOUNTER — Other Ambulatory Visit: Payer: Self-pay | Admitting: Family Medicine

## 2024-08-07 NOTE — Telephone Encounter (Unsigned)
 Copied from CRM #[id number]. Topic: Clinical - Medication Refill >> Aug 07, 2024  4:34 PM China J wrote: Medication: SYRINGE-NEEDLE, DISP, 3 ML (BD INTEGRA SYRINGE) 25G X 1 3 ML MISC  Has the patient contacted their pharmacy? Yes (Agent: If no, request that the patient contact the pharmacy for the refill. If patient does not wish to contact the pharmacy document the reason why and proceed with request.) (Agent: If yes, when and what did the pharmacy advise?)  This is the patient's preferred pharmacy:  [facility] [phone] [facility], [place] - [address] [place] [address] Phone: [phone] Fax: [phone]  Is this the correct pharmacy for this prescription? Yes If no, delete pharmacy and type the correct one.   Has the prescription been filled recently? No  Is the patient out of the medication? Yes  Has the patient been seen for an appointment in the last year OR does the patient have an upcoming appointment? Yes  Can we respond through MyChart? No  Agent: Please be advised that Rx refills may take up to 3 business days. We ask that you follow-up with your pharmacy.

## 2024-08-07 NOTE — Telephone Encounter (Signed)
 Copied from CRM #[id number]. Topic: Clinical - Medication Refill >> Jul 30, 2024 10:32 AM Charolett L wrote: Medication: levothyroxine  (SYNTHROID ) 75 MCG tablet  Has the patient contacted their pharmacy? Yes (Agent: If no, request that the patient contact the pharmacy for the refill. If patient does not wish to contact the pharmacy document the reason why and proceed with request.) (Agent: If yes, when and what did the pharmacy advise?)  This is the patient's preferred pharmacy:  [phone] [facility], [place] - [address] [place] [address] Phone: [phone] Fax: [phone]  Is this the correct pharmacy for this prescription? Yes If no, delete pharmacy and type the correct one.   Has the prescription been filled recently? Yes  Is the patient out of the medication? Yes  Has the patient been seen for an appointment in the last year OR does the patient have an upcoming appointment? Yes  Can we respond through MyChart? Yes  Agent: Please be advised that Rx refills may take up to 3 business days. We ask that you follow-up with your pharmacy. >> Aug 07, 2024  4:35 PM China J wrote: The patient is calling to see if Dr. Avelina could get this approved for her since it is has been over the turn around time and she is now completley out of medication.

## 2024-08-07 NOTE — Telephone Encounter (Signed)
 Copied from CRM #[id number]. Topic: Clinical - Medication Refill >> Aug 07, 2024  4:34 PM China J wrote: Medication: SYRINGE-NEEDLE, DISP, 3 ML (BD INTEGRA SYRINGE) 25G X 1 3 ML MISC  Has the patient contacted their pharmacy? Yes (Agent: If no, request that the patient contact the pharmacy for the refill. If patient does not wish to contact the pharmacy document the reason why and proceed with request.) (Agent: If yes, when and what did the pharmacy advise?)  This is the patient's preferred pharmacy:  [facility] [phone] [facility], [place] - [address] [place] [address] Phone: [phone] Fax: [phone]  Is this the correct pharmacy for this prescription? Yes If no, delete pharmacy and type the correct one.   Has the prescription been filled recently? No  Is the patient out of the medication? Yes  Has the patient been seen for an appointment in the last year OR does the patient have an upcoming appointment? Yes  Can we respond through MyChart? No  Agent: Please be advised that Rx refills may take up to 3 business days. We ask that you follow-up with your pharmacy.

## 2024-08-08 MED ORDER — LEVOTHYROXINE SODIUM 75 MCG PO TABS: 75.0000 ug | ORAL_TABLET | Freq: Every day | ORAL | 3 refills | Status: AC

## 2024-08-08 NOTE — Telephone Encounter (Addendum)
 Patient has available refills on her syringes at the pharmacy.  [facility]  They do have the Rx on file and will get that ready for her.

## 2024-08-15 ENCOUNTER — Other Ambulatory Visit: Payer: Self-pay | Admitting: Family Medicine

## 2024-08-15 NOTE — Telephone Encounter (Signed)
 Last office visit 06/28/2024 for CPE. Last refilled 07/12/2024 for #30 with no refills.  Next appt: 12/27/24 for HTN

## 2024-08-15 NOTE — Telephone Encounter (Signed)
 Copied from CRM #[id number]. Topic: Clinical - Medication Refill >> Aug 15, 2024 12:19 PM Mesmerise C wrote: Medication: LORazepam  (ATIVAN ) 1 MG tablet  Has the patient contacted their pharmacy? Yes (Agent: If no, request that the patient contact the pharmacy for the refill. If patient does not wish to contact the pharmacy document the reason why and proceed with request.) (Agent: If yes, when and what did the pharmacy advise?)  This is the patient's preferred pharmacy:  [facility] [address] [place] - [address] [place] [address] Phone: [phone] Fax: [phone]   Is this the correct pharmacy for this prescription? Yes If no, delete pharmacy and type the correct one.   Has the prescription been filled recently? No  Is the patient out of the medication? No  Has the patient been seen for an appointment in the last year OR does the patient have an upcoming appointment? Yes  Can we respond through MyChart? Yes  Agent: Please be advised that Rx refills may take up to 3 business days. We ask that you follow-up with your pharmacy.

## 2024-08-15 NOTE — Telephone Encounter (Signed)
Refill was sent in earlier today. 

## 2024-09-13 ENCOUNTER — Other Ambulatory Visit: Payer: Self-pay | Admitting: Family Medicine

## 2024-09-13 NOTE — Telephone Encounter (Signed)
 Last office visit 06/28/2024 for CPE.  Last refilled 08/15/2024 for #30 with no refills. Next Appt: 12/27/24 for HTN.

## 2024-12-27 ENCOUNTER — Ambulatory Visit: Admitting: Family Medicine

## 2025-07-09 ENCOUNTER — Encounter: Admitting: Family Medicine

## 2025-07-09 ENCOUNTER — Ambulatory Visit
